# Patient Record
Sex: Male | Born: 1985 | Race: Black or African American | Hispanic: No | Marital: Married | Smoking: Current every day smoker
Health system: Southern US, Community
[De-identification: ages and names within clinical notes are randomized; demographics above are authoritative.]

## PROBLEM LIST (undated history)

## (undated) DIAGNOSIS — Z789 Other specified health status: Secondary | ICD-10-CM

## (undated) HISTORY — PX: NO PAST SURGERIES: SHX2092

---

## 2006-01-03 ENCOUNTER — Emergency Department (HOSPITAL_COMMUNITY): Admission: EM | Admit: 2006-01-03 | Discharge: 2006-01-03 | Payer: Self-pay | Admitting: *Deleted

## 2007-01-24 ENCOUNTER — Emergency Department (HOSPITAL_COMMUNITY): Admission: EM | Admit: 2007-01-24 | Discharge: 2007-01-24 | Payer: Self-pay | Admitting: Emergency Medicine

## 2007-02-09 ENCOUNTER — Emergency Department (HOSPITAL_COMMUNITY): Admission: EM | Admit: 2007-02-09 | Discharge: 2007-02-09 | Payer: Self-pay | Admitting: Emergency Medicine

## 2009-03-01 ENCOUNTER — Emergency Department (HOSPITAL_COMMUNITY): Admission: EM | Admit: 2009-03-01 | Discharge: 2009-03-01 | Payer: Self-pay | Admitting: Family Medicine

## 2009-05-11 ENCOUNTER — Emergency Department (HOSPITAL_COMMUNITY): Admission: EM | Admit: 2009-05-11 | Discharge: 2009-05-11 | Payer: Self-pay | Admitting: Family Medicine

## 2009-08-24 ENCOUNTER — Emergency Department (HOSPITAL_COMMUNITY): Admission: EM | Admit: 2009-08-24 | Discharge: 2009-08-24 | Payer: Self-pay | Admitting: Emergency Medicine

## 2009-11-04 ENCOUNTER — Emergency Department (HOSPITAL_COMMUNITY): Admission: EM | Admit: 2009-11-04 | Discharge: 2009-11-04 | Payer: Self-pay | Admitting: Emergency Medicine

## 2009-12-11 ENCOUNTER — Emergency Department (HOSPITAL_COMMUNITY): Admission: EM | Admit: 2009-12-11 | Discharge: 2009-12-11 | Payer: Self-pay | Admitting: Family Medicine

## 2010-02-25 ENCOUNTER — Emergency Department (HOSPITAL_COMMUNITY): Admission: EM | Admit: 2010-02-25 | Discharge: 2010-02-25 | Payer: Self-pay | Admitting: Family Medicine

## 2010-12-01 LAB — DIFFERENTIAL
Basophils Absolute: 0 10*3/uL (ref 0.0–0.1)
Lymphocytes Relative: 12 % (ref 12–46)
Lymphs Abs: 0.5 10*3/uL — ABNORMAL LOW (ref 0.7–4.0)
Neutro Abs: 3.4 10*3/uL (ref 1.7–7.7)
Neutrophils Relative %: 75 % (ref 43–77)

## 2010-12-01 LAB — POCT I-STAT, CHEM 8
BUN: 10 mg/dL (ref 6–23)
Chloride: 103 mEq/L (ref 96–112)
Creatinine, Ser: 1.2 mg/dL (ref 0.4–1.5)
Potassium: 3.5 mEq/L (ref 3.5–5.1)
Sodium: 137 mEq/L (ref 135–145)
TCO2: 27 mmol/L (ref 0–100)

## 2010-12-01 LAB — CBC
Platelets: 172 10*3/uL (ref 150–400)
RDW: 13.3 % (ref 11.5–15.5)
WBC: 4.5 10*3/uL (ref 4.0–10.5)

## 2011-01-06 ENCOUNTER — Emergency Department (HOSPITAL_BASED_OUTPATIENT_CLINIC_OR_DEPARTMENT_OTHER)
Admission: EM | Admit: 2011-01-06 | Discharge: 2011-01-06 | Disposition: A | Discharge status: Home / Self Care | Payer: Self-pay | Attending: Emergency Medicine | Admitting: Emergency Medicine

## 2011-01-06 DIAGNOSIS — J069 Acute upper respiratory infection, unspecified: Secondary | ICD-10-CM | POA: Insufficient documentation

## 2011-01-06 DIAGNOSIS — J45909 Unspecified asthma, uncomplicated: Secondary | ICD-10-CM | POA: Insufficient documentation

## 2011-02-25 ENCOUNTER — Emergency Department (HOSPITAL_BASED_OUTPATIENT_CLINIC_OR_DEPARTMENT_OTHER)
Admission: EM | Admit: 2011-02-25 | Discharge: 2011-02-25 | Disposition: A | Discharge status: Home / Self Care | Payer: Self-pay | Attending: Emergency Medicine | Admitting: Emergency Medicine

## 2011-02-25 DIAGNOSIS — R112 Nausea with vomiting, unspecified: Secondary | ICD-10-CM | POA: Insufficient documentation

## 2011-02-25 DIAGNOSIS — R197 Diarrhea, unspecified: Secondary | ICD-10-CM | POA: Insufficient documentation

## 2011-02-25 DIAGNOSIS — J45909 Unspecified asthma, uncomplicated: Secondary | ICD-10-CM | POA: Insufficient documentation

## 2011-02-25 DIAGNOSIS — F17200 Nicotine dependence, unspecified, uncomplicated: Secondary | ICD-10-CM | POA: Insufficient documentation

## 2011-02-25 LAB — URINALYSIS, ROUTINE W REFLEX MICROSCOPIC
Glucose, UA: NEGATIVE mg/dL
Leukocytes, UA: NEGATIVE
Protein, ur: NEGATIVE mg/dL
Specific Gravity, Urine: 1.021 (ref 1.005–1.030)
pH: 7 (ref 5.0–8.0)

## 2011-08-02 ENCOUNTER — Emergency Department (HOSPITAL_BASED_OUTPATIENT_CLINIC_OR_DEPARTMENT_OTHER)
Admission: EM | Admit: 2011-08-02 | Discharge: 2011-08-02 | Disposition: A | Discharge status: Home / Self Care | Payer: Self-pay | Attending: Emergency Medicine | Admitting: Emergency Medicine

## 2011-08-02 ENCOUNTER — Encounter: Payer: Self-pay | Admitting: *Deleted

## 2011-08-02 DIAGNOSIS — S39012A Strain of muscle, fascia and tendon of lower back, initial encounter: Secondary | ICD-10-CM

## 2011-08-02 DIAGNOSIS — Y92009 Unspecified place in unspecified non-institutional (private) residence as the place of occurrence of the external cause: Secondary | ICD-10-CM | POA: Insufficient documentation

## 2011-08-02 DIAGNOSIS — IMO0002 Reserved for concepts with insufficient information to code with codable children: Secondary | ICD-10-CM | POA: Insufficient documentation

## 2011-08-02 DIAGNOSIS — X58XXXA Exposure to other specified factors, initial encounter: Secondary | ICD-10-CM | POA: Insufficient documentation

## 2011-08-02 LAB — URINALYSIS, ROUTINE W REFLEX MICROSCOPIC
Bilirubin Urine: NEGATIVE
Glucose, UA: NEGATIVE mg/dL
Hgb urine dipstick: NEGATIVE
Urobilinogen, UA: 1 mg/dL (ref 0.0–1.0)

## 2011-08-02 LAB — URINE MICROSCOPIC-ADD ON

## 2011-08-02 MED ORDER — CYCLOBENZAPRINE HCL 10 MG PO TABS: 10.0000 mg | ORAL_TABLET | Freq: Three times a day (TID) | ORAL | Status: AC | PRN

## 2011-08-02 MED ORDER — HYDROCODONE-ACETAMINOPHEN 5-500 MG PO TABS: 1.0000 | ORAL_TABLET | Freq: Four times a day (QID) | ORAL | Status: AC | PRN

## 2011-08-02 MED ORDER — KETOROLAC TROMETHAMINE 30 MG/ML IJ SOLN
60.0000 mg | Freq: Once | INTRAMUSCULAR | Status: AC
  Administered 2011-08-02: 60 mg via INTRAMUSCULAR
  Filled 2011-08-02: qty 2

## 2011-08-02 NOTE — ED Notes (Signed)
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

## 2011-08-02 NOTE — ED Provider Notes (Signed)
History  This chart was scribed for Cyndra Numbers, MD by Bennett Scrape. This patient was seen in room [facility]1/[facility]1 and the patient's care was started at 8:12PM.  CSN: [id number] Arrival date & time: 08/02/2011  7:06 PM   First MD Initiated Contact with Patient 08/02/11 2011      Chief Complaint  Patient presents with  . Back Pain   The history is provided by the patient. No language interpreter was used.    Alexander Roberts is a 25 y.o. male who presents to the Emergency Department complaining of 2 days of constant, gradually worsening non-radiating right-sided lower back pain. Pt states that he has a h/o back pain from growth spurts when younger. Pt denies any recent injury as the cause of the pain. Pt states that 2 days ago he went to bed and woke up with the pain. Pt states that he has used biofreeze, icy hot and a heating pad and has taken Aleve with temporary mild improvement in symptoms. He reports that the pain is worse with movement and better with rest. He denies numbness and tingling as associated symptoms. Pain is 5/10 while still and 8/10 with movement.  History reviewed. No pertinent past medical history.  History reviewed. No pertinent past surgical history.  History reviewed. No pertinent family history.  History  Substance Use Topics  . Smoking status: Former Games developer  . Smokeless tobacco: Not on file  . Alcohol Use: Yes     occasional      Review of Systems  Constitutional: Negative.  Negative for fever and chills.  HENT: Negative.  Negative for congestion, sore throat and neck pain.   Eyes: Negative.  Negative for pain and visual disturbance.  Respiratory: Negative.  Negative for cough, shortness of breath and wheezing.   Cardiovascular: Negative.  Negative for chest pain and leg swelling.  Gastrointestinal: Negative.  Negative for nausea, vomiting, abdominal pain and diarrhea.  Genitourinary: Negative.  Negative for dysuria and hematuria.  Musculoskeletal:  Positive for back pain. Negative for joint swelling.  Skin: Negative.  Negative for rash and wound.  Neurological: Negative.  Negative for light-headedness and headaches.  Hematological: Negative.  Does not bruise/bleed easily.  Psychiatric/Behavioral: Negative.   All other systems reviewed and are negative.    Allergies  Review of patient's allergies indicates no known allergies.  Home Medications   Current Outpatient Rx  Name Route Sig Dispense Refill  . MENTHOL (TOPICAL ANALGESIC) 4 % EX GEL Apply externally Apply 1 application topically 3 (three) times daily as needed. For pain     . MUSCLE RUB 10-15 % EX CREA Topical Apply 1 application topically 3 (three) times daily as needed. For back pain     . NAPROXEN SODIUM 220 MG PO TABS Oral Take 440 mg by mouth 2 (two) times daily as needed. For pain      . CYCLOBENZAPRINE HCL 10 MG PO TABS Oral Take 1 tablet (10 mg total) by mouth 3 (three) times daily as needed for muscle spasms. 30 tablet 0  . HYDROCODONE-ACETAMINOPHEN 5-500 MG PO TABS Oral Take 1-2 tablets by mouth every 6 (six) hours as needed for pain. 15 tablet 0    Triage Vitals: BP 122/70  Pulse 90  Temp(Src) 98.3 F (36.8 C) (Oral)  Resp 16  SpO2 100%  Physical Exam  Nursing note and vitals reviewed. Constitutional: He is oriented to person, place, and time. He appears well-developed and well-nourished. No distress.  HENT:  Head: Normocephalic and atraumatic.  Eyes: Conjunctivae and EOM are normal. Pupils are equal, round, and reactive to light.  Neck: Neck supple. No tracheal deviation present.  Cardiovascular: Normal rate, regular rhythm and normal heart sounds.  Exam reveals no gallop and no friction rub.   No murmur heard. Pulmonary/Chest: Effort normal and breath sounds normal. No respiratory distress. He has no wheezes. He has no rales.  Abdominal: Soft. Bowel sounds are normal. He exhibits no distension. There is no tenderness. There is no rebound and no  guarding.  Musculoskeletal: Normal range of motion. He exhibits no edema and no tenderness (No tenderness to palpation).  Neurological: He is alert and oriented to person, place, and time. No cranial nerve deficit. He exhibits normal muscle tone. Coordination normal.  Skin: Skin is warm and dry.  Psychiatric: He has a normal mood and affect.    ED Course  Procedures (including critical care time)  DIAGNOSTIC STUDIES: Oxygen Saturation is 100% on room air, normal by my interpretation.    COORDINATION OF CARE: 8:14PM-Discussed prescription Vicodin and cycloprixadine muscle relaxers and back exercisers to fight stiffness and pt agreed to plan. Will give pt Toradol in ED.   Labs Reviewed  URINALYSIS, ROUTINE W REFLEX MICROSCOPIC - Abnormal; Notable for the following:    Leukocytes, UA TRACE (*)    All other components within normal limits  URINE MICROSCOPIC-ADD ON - Abnormal; Notable for the following:    Squamous Epithelial / LPF FEW (*)    Bacteria, UA MANY (*)    All other components within normal limits   No results found.   1. Back strain       MDM  Patient described the symptoms of back strain and when he did perform bending or twisting motions this exacerbated his symptoms.  He had no red flags.  Patient does work lifting heavy boxes.  He was given flexeril and vicodin and advised to continue anti-inflammatory therapy with alleve.  Patient was given a note for work if he needed it and discharged in good condition.      I personally performed the services described in this documentation, which was scribed in my presence. The recorded information has been reviewed and considered.    Cyndra Numbers, MD 08/03/11 [phone]

## 2012-01-15 ENCOUNTER — Emergency Department (HOSPITAL_BASED_OUTPATIENT_CLINIC_OR_DEPARTMENT_OTHER)
Admission: EM | Admit: 2012-01-15 | Discharge: 2012-01-15 | Disposition: A | Discharge status: Home / Self Care | Payer: 59 | Attending: Emergency Medicine | Admitting: Emergency Medicine

## 2012-01-15 ENCOUNTER — Encounter (HOSPITAL_BASED_OUTPATIENT_CLINIC_OR_DEPARTMENT_OTHER): Payer: Self-pay | Admitting: Family Medicine

## 2012-01-15 DIAGNOSIS — F17200 Nicotine dependence, unspecified, uncomplicated: Secondary | ICD-10-CM | POA: Insufficient documentation

## 2012-01-15 DIAGNOSIS — Z79899 Other long term (current) drug therapy: Secondary | ICD-10-CM | POA: Insufficient documentation

## 2012-01-15 DIAGNOSIS — R6884 Jaw pain: Secondary | ICD-10-CM | POA: Insufficient documentation

## 2012-01-15 DIAGNOSIS — K0889 Other specified disorders of teeth and supporting structures: Secondary | ICD-10-CM

## 2012-01-15 DIAGNOSIS — K029 Dental caries, unspecified: Secondary | ICD-10-CM | POA: Insufficient documentation

## 2012-01-15 DIAGNOSIS — K089 Disorder of teeth and supporting structures, unspecified: Secondary | ICD-10-CM | POA: Insufficient documentation

## 2012-01-15 MED ORDER — TRAMADOL HCL 50 MG PO TABS: 50.0000 mg | ORAL_TABLET | Freq: Four times a day (QID) | ORAL | Status: AC | PRN

## 2012-01-15 MED ORDER — IBUPROFEN 800 MG PO TABS
800.0000 mg | ORAL_TABLET | Freq: Once | ORAL | Status: AC
  Administered 2012-01-15: 800 mg via ORAL
  Filled 2012-01-15: qty 1

## 2012-01-15 MED ORDER — PENICILLIN V POTASSIUM 250 MG PO TABS
500.0000 mg | ORAL_TABLET | Freq: Once | ORAL | Status: AC
  Administered 2012-01-15: 500 mg via ORAL
  Filled 2012-01-15: qty 2

## 2012-01-15 MED ORDER — NAPROXEN 500 MG PO TABS: 500.0000 mg | ORAL_TABLET | Freq: Two times a day (BID) | ORAL | Status: AC

## 2012-01-15 MED ORDER — OXYCODONE-ACETAMINOPHEN 5-325 MG PO TABS
2.0000 | ORAL_TABLET | Freq: Once | ORAL | Status: AC
  Administered 2012-01-15: 2 via ORAL
  Filled 2012-01-15: qty 2

## 2012-01-15 MED ORDER — PENICILLIN V POTASSIUM 500 MG PO TABS: 500.0000 mg | ORAL_TABLET | Freq: Three times a day (TID) | ORAL | Status: AC

## 2012-01-15 NOTE — ED Notes (Signed)
Pt c/o left lower tooth pain x 2 days. Pt sts he is taking otc ibuprofen and orajel withou relief. Pt sts he does not have dentist.

## 2012-01-15 NOTE — Discharge Instructions (Signed)
Dental Pain A tooth ache may be caused by cavities (tooth decay). Cavities expose the nerve of the tooth to air and hot or cold temperatures. It may come from an infection or abscess (also called a boil or furuncle) around your tooth. It is also often caused by dental caries (tooth decay). This causes the pain you are having. DIAGNOSIS  Your caregiver can diagnose this problem by exam. TREATMENT   If caused by an infection, it may be treated with medications which kill germs (antibiotics) and pain medications as prescribed by your caregiver. Take medications as directed.   Only take over-the-counter or prescription medicines for pain, discomfort, or fever as directed by your caregiver.   Whether the tooth ache today is caused by infection or dental disease, you should see your dentist as soon as possible for further care.  SEEK MEDICAL CARE IF: The exam and treatment you received today has been provided on an emergency basis only. This is not a substitute for complete medical or dental care. If your problem worsens or new problems (symptoms) appear, and you are unable to meet with your dentist, call or return to this location. SEEK IMMEDIATE MEDICAL CARE IF:   You have a fever.   You develop redness and swelling of your face, jaw, or neck.   You are unable to open your mouth.   You have severe pain uncontrolled by pain medicine.  MAKE SURE YOU:   Understand these instructions.   Will watch your condition.   Will get help right away if you are not doing well or get worse.  Document Released: 08/17/2005 Document Revised: 08/06/2011 Document Reviewed: 04/04/2008 [facility] Patient Information 2012 [place], [place].  Dental Caries  Tooth decay (dental caries, cavities) is the most common of all oral diseases. It occurs in all ages but is more common in children and young adults.  CAUSES  Bacteria in your mouth combine with foods (particularly sugars and starches) to produce plaque. Plaque  is a substance that sticks to the hard surfaces of teeth. The bacteria in the plaque produce acids that attack the enamel of teeth. Repeated acid attacks dissolve the enamel and create holes in the teeth. Root surfaces of teeth may also get these holes.  Other contributing factors include:   Frequent snacking and drinking of cavity-producing foods and liquids.   Poor oral hygiene.   Dry mouth.   Substance abuse such as methamphetamine.   Broken or poor fitting dental restorations.   Eating disorders.   Gastroesophageal reflux disease (GERD).   Certain radiation treatments to the head and neck.  SYMPTOMS  At first, dental decay appears as white, chalky areas on the enamel. In this early stage, symptoms are seldom present. As the decay progresses, pits and holes may appear on the enamel surfaces. Progression of the decay will lead to softening of the hard layers of the tooth. At this point you may experience some pain or achy feeling after sweet, hot, or cold foods or drinks are consumed. If left untreated, the decay will reach the internal structures of the tooth and produce severe pain. Extensive dental treatment, such as root canal therapy, may be needed to save the tooth at this late stage of decay development.  DIAGNOSIS  Most cavities will be detected during regular check-ups. A thorough medical and dental history will be taken by the dentist. The dentist will use instruments to check the surfaces of your teeth for any breakdown or discoloration. Some dentists have special instruments,  such as lasers, that detect tooth decay. Dental X-rays may also show some cavities that are not visible to the eye (such as between the contact areas of the teeth). TREATMENT  Treatment involves removal of the tooth decay and replacement with a restorative material such as silver, gold, or composite (white) material. However, if the decay involves a large area of the tooth and there is little remaining  healthy tooth structure, a cap (crown) will be fitted over the remaining structure. If the decay involves the center part of the tooth (pulp), root canal treatment will be needed before any type of dental restoration is placed. If the tooth is severely destroyed by the decay process, leaving the remaining tooth structures unrestorable, the tooth will need to be pulled (extracted). Some early tooth decay may be reversed by fluoride treatments and thorough brushing and flossing at home. PREVENTION   Eat healthy foods. Restrict the amount of sugary, starchy foods and liquids you consume. Avoid frequent snacking and drinking of unhealthy foods and liquids.   Sealants can help with prevention of cavities. Sealants are composite resins applied onto the biting surfaces of teeth at risk for decay. They smooth out the pits and grooves and prevent food from being trapped in them. This is done in early childhood before tooth decay has started.   Fluoride tablets may also be prescribed to children between 6 months and 52 years of age if your drinking water is not fluoridated. The fluoride absorbed by the tooth enamel makes teeth less susceptible to decay. Thorough daily cleaning with a toothbrush and dental floss is the best way to prevent cavities. Use of a fluoride toothpaste is highly recommended. Fluoride mouth rinses may be used in specific cases.   Topical application of fluoride by your dentist is important in children.   Regular visits with a dentist for checkups and cleanings are also important.  SEEK IMMEDIATE DENTAL CARE IF:  You have a fever.   You develop redness and swelling of your face, jaw, or neck.   You develop swelling around a tooth.   You are unable to open your mouth or cannot swallow.   You have severe pain uncontrolled by pain medicine.  Document Released: 05/09/2002 Document Revised: 08/06/2011 Document Reviewed: 01/22/2011 [facility] Patient Information 2012 [place],  [place].  RESOURCE GUIDE  Dental Problems  Patients with Medicaid: [facility] [phone] [address]                                           [phone] [address] Phone:  [phone]                                                  Phone:  [phone]  If unable to pay or uninsured, contact:  [facility] or [facility]. to become qualified for the adult dental clinic.  Chronic Pain Problems Contact [facility]  [phone] Patients need to be referred by their primary care doctor.  Insufficient Money for Medicine Contact [employer]:  call "211" or [facility] 419-437-8000.  No Primary  Care Doctor Call [facility]  [phone] Other agencies that provide inexpensive medical care    [facility]  [phone]    [facility]  [phone]    [facility]  [phone]    [facility]  [phone]    [facility]  [phone]    [facility]  [phone]  Psychological Services [facility]  [phone] [facility] Services  [phone] [facility]   [phone] (emergency services [phone])  Substance Abuse Resources [facility]  [phone] [facility] [phone] [facility] [phone] Floydene Flock [phone] Residential & Outpatient Substance Abuse Program  (559)[phone]  Abuse/Neglect [facility] Child Abuse Hotline 616-738-9983 [facility] Child Abuse Hotline 773-256-5756 (After Hours)  Emergency Shelter [facility] [phone]  Maternity Homes [facility] 9164494521 [facility] [phone]  MRSA Hotline #:   587-102-3353    [facility] Resources  [facility]     [employer]                          [facility] Dept. [address] [place]                       [address]      [address]  Blondell Reveal Phone:  [phone]                                   Phone:  [phone]                 Phone:  [phone]  [facility] Mental Health Phone:  [phone]  [facility] Child Abuse Hotline [phone]) [phone])315-0771 (After Hours)

## 2012-01-15 NOTE — ED Provider Notes (Signed)
History     CSN: [id number]  Arrival date & time 01/15/12  1106   First MD Initiated Contact with Patient 01/15/12 1122      Chief Complaint  Patient presents with  . Dental Pain    (Consider location/radiation/quality/duration/timing/severity/associated sxs/prior treatment) HPI Comments: Has not seen a dentist in several years  Patient is a 26 y.o. male presenting with tooth pain. The history is provided by the patient. No language interpreter was used.  Dental PainPrimary symptoms do not include dental injury, oral bleeding, oral lesions, headaches, fever, shortness of breath, sore throat or cough. The symptoms began 2 days ago. The symptoms are worsening. The symptoms are new. The symptoms occur constantly.  Additional symptoms include: dental sensitivity to temperature and jaw pain. Additional symptoms do not include: gum swelling, gum tenderness, trismus, facial swelling, trouble swallowing, pain with swallowing and fatigue.    History reviewed. No pertinent past medical history.  History reviewed. No pertinent past surgical history.  No family history on file.  History  Substance Use Topics  . Smoking status: Current Some Day Smoker  . Smokeless tobacco: Not on file  . Alcohol Use: Yes     occasional      Review of Systems  Constitutional: Negative for fever, activity change, appetite change and fatigue.  HENT: Positive for dental problem. Negative for congestion, sore throat, facial swelling, rhinorrhea, trouble swallowing, neck pain and neck stiffness.   Respiratory: Negative for cough and shortness of breath.   Cardiovascular: Negative for chest pain and palpitations.  Gastrointestinal: Negative for nausea, vomiting and abdominal pain.  Genitourinary: Negative for dysuria, urgency, frequency and flank pain.  Musculoskeletal: Negative for myalgias, back pain and arthralgias.  Neurological: Negative for dizziness, weakness, light-headedness, numbness and  headaches.  All other systems reviewed and are negative.    Allergies  Review of patient's allergies indicates no known allergies.  Home Medications   Current Outpatient Rx  Name Route Sig Dispense Refill  . MENTHOL (TOPICAL ANALGESIC) 4 % EX GEL Apply externally Apply 1 application topically 3 (three) times daily as needed. For pain     . MUSCLE RUB 10-15 % EX CREA Topical Apply 1 application topically 3 (three) times daily as needed. For back pain     . NAPROXEN 500 MG PO TABS Oral Take 1 tablet (500 mg total) by mouth 2 (two) times daily. 30 tablet 0  . NAPROXEN SODIUM 220 MG PO TABS Oral Take 440 mg by mouth 2 (two) times daily as needed. For pain      . PENICILLIN V POTASSIUM 500 MG PO TABS Oral Take 1 tablet (500 mg total) by mouth 3 (three) times daily. 30 tablet 0  . TRAMADOL HCL 50 MG PO TABS Oral Take 1 tablet (50 mg total) by mouth every 6 (six) hours as needed for pain. 15 tablet 0    BP 137/70  Pulse 63  Temp(Src) 98.3 F (36.8 C) (Oral)  Resp 16  Ht 6\' 3"  (1.905 m)  Wt 270 lb (122.471 kg)  BMI 33.75 kg/m2  SpO2 100%  Physical Exam  Nursing note and vitals reviewed. Constitutional: He is oriented to person, place, and time. He appears well-developed and well-nourished. No distress.  HENT:  Head: Normocephalic and atraumatic.  Mouth/Throat: Oropharynx is clear and moist. Abnormal dentition. Dental caries present. No dental abscesses.    Eyes: Conjunctivae and EOM are normal. Pupils are equal, round, and reactive to light.  Neck: Normal range of motion. Neck supple.  Cardiovascular: Normal rate, regular rhythm, normal heart sounds and intact distal pulses.  Exam reveals no gallop and no friction rub.   No murmur heard. Pulmonary/Chest: Effort normal and breath sounds normal. No respiratory distress. He exhibits no tenderness.  Abdominal: Soft. Bowel sounds are normal. There is no tenderness. There is no rebound and no guarding.  Musculoskeletal: Normal range of  motion. He exhibits no edema and no tenderness.  Neurological: He is alert and oriented to person, place, and time. No cranial nerve deficit.    ED Course  Procedures (including critical care time)  Labs Reviewed - No data to display No results found.   1. Dental caries   2. Dentalgia       MDM  Dental pain with no evidence of abscess or Ludwig's angina. He was placed on penicillin given Percocet and ibuprofen the emergency department. He'll be discharged home with Naprosyn, penicillin, Ultram. Instructed to followup with the dentist.        Dayton Bailiff, MD 01/15/12 1140

## 2012-02-07 ENCOUNTER — Encounter (HOSPITAL_COMMUNITY): Payer: Self-pay | Admitting: *Deleted

## 2012-02-07 ENCOUNTER — Emergency Department (INDEPENDENT_AMBULATORY_CARE_PROVIDER_SITE_OTHER)
Admission: EM | Admit: 2012-02-07 | Discharge: 2012-02-07 | Disposition: A | Discharge status: Home / Self Care | Payer: 59 | Source: Home / Self Care | Attending: Emergency Medicine | Admitting: Emergency Medicine

## 2012-02-07 DIAGNOSIS — K089 Disorder of teeth and supporting structures, unspecified: Secondary | ICD-10-CM

## 2012-02-07 DIAGNOSIS — K0889 Other specified disorders of teeth and supporting structures: Secondary | ICD-10-CM

## 2012-02-07 MED ORDER — PENICILLIN V POTASSIUM 500 MG PO TABS: 500.0000 mg | ORAL_TABLET | Freq: Four times a day (QID) | ORAL | Status: AC

## 2012-02-07 MED ORDER — NAPROXEN 500 MG PO TABS: 500.0000 mg | ORAL_TABLET | Freq: Two times a day (BID) | ORAL | Status: AC

## 2012-02-07 MED ORDER — HYDROCODONE-ACETAMINOPHEN 5-325 MG PO TABS: 1.0000 | ORAL_TABLET | ORAL | Status: AC | PRN

## 2012-02-07 NOTE — ED Notes (Signed)
Per pt left lower tooth pain x 2 - 3 weeks - will see dentist tomorrow just rec'd dental insurance

## 2012-02-07 NOTE — ED Provider Notes (Signed)
History     CSN: [id number]  Arrival date & time 02/07/12  1122   First MD Initiated Contact with Patient 02/07/12 1237      Chief Complaint  Patient presents with  . Dental Pain    (Consider location/radiation/quality/duration/timing/severity/associated sxs/prior treatment) HPI Comments: Pt's filling L lower jaw tooth fell out "a while ago". Was tx once previously for pain in this tooth that helped, requests tx again.  Sx worse in last 2-3 weeks.  Has funds to be able to go to dentist this week, but requests pencillin and naproxen again for pain until he can see dentist.      Patient is a 26 y.o. male presenting with tooth pain. The history is provided by the patient.  Dental PainThe primary symptoms include mouth pain. Primary symptoms do not include fever or sore throat. Episode onset: months ago. The symptoms are worsening. The symptoms are recurrent. The symptoms occur constantly.  Additional symptoms include: dental sensitivity to temperature. Additional symptoms do not include: gum tenderness and purulent gums.    History reviewed. No pertinent past medical history.  History reviewed. No pertinent past surgical history.  History reviewed. No pertinent family history.  History  Substance Use Topics  . Smoking status: Current Some Day Smoker  . Smokeless tobacco: Not on file  . Alcohol Use: Yes     occasional      Review of Systems  Constitutional: Negative for fever and chills.  HENT: Positive for dental problem. Negative for sore throat.     Allergies  Review of patient's allergies indicates no known allergies.  Home Medications   Current Outpatient Rx  Name Route Sig Dispense Refill  . HYDROCODONE-ACETAMINOPHEN 5-325 MG PO TABS Oral Take 1-2 tablets by mouth every 4 (four) hours as needed for pain. 10 tablet 0  . MENTHOL (TOPICAL ANALGESIC) 4 % EX GEL Apply externally Apply 1 application topically 3 (three) times daily as needed. For pain    . MUSCLE RUB  10-15 % EX CREA Topical Apply 1 application topically 3 (three) times daily as needed. For back pain     . NAPROXEN 500 MG PO TABS Oral Take 1 tablet (500 mg total) by mouth 2 (two) times daily. 30 tablet 0  . NAPROXEN 500 MG PO TABS Oral Take 1 tablet (500 mg total) by mouth 2 (two) times daily. 30 tablet 0  . NAPROXEN SODIUM 220 MG PO TABS Oral Take 440 mg by mouth 2 (two) times daily as needed. For pain      . PENICILLIN V POTASSIUM 500 MG PO TABS Oral Take 1 tablet (500 mg total) by mouth 4 (four) times daily. 40 tablet 0    BP 144/76  Pulse 63  Temp(Src) 98.8 F (37.1 C) (Oral)  Resp 16  SpO2 99%  Physical Exam  Constitutional: He appears well-developed and well-nourished. No distress.  HENT:  Mouth/Throat: No dental abscesses.    Pulmonary/Chest: Effort normal.  Lymphadenopathy:       Head (right side): No submandibular adenopathy present.       Head (left side): Submandibular adenopathy present.    ED Course  Procedures (including critical care time)  Labs Reviewed - No data to display No results found.   1. Pain, dental       MDM          Cathlyn Parsons, NP 02/07/12 1246

## 2012-02-07 NOTE — Discharge Instructions (Signed)
Please follow up with a dentist as soon as possible.   Dental Pain A tooth ache may be caused by cavities (tooth decay). Cavities expose the nerve of the tooth to air and hot or cold temperatures. It may come from an infection or abscess (also called a boil or furuncle) around your tooth. It is also often caused by dental caries (tooth decay). This causes the pain you are having. DIAGNOSIS  Your caregiver can diagnose this problem by exam. TREATMENT   If caused by an infection, it may be treated with medications which kill germs (antibiotics) and pain medications as prescribed by your caregiver. Take medications as directed.   Only take over-the-counter or prescription medicines for pain, discomfort, or fever as directed by your caregiver.   Whether the tooth ache today is caused by infection or dental disease, you should see your dentist as soon as possible for further care.  SEEK MEDICAL CARE IF: The exam and treatment you received today has been provided on an emergency basis only. This is not a substitute for complete medical or dental care. If your problem worsens or new problems (symptoms) appear, and you are unable to meet with your dentist, call or return to this location. SEEK IMMEDIATE MEDICAL CARE IF:   You have a fever.   You develop redness and swelling of your face, jaw, or neck.   You are unable to open your mouth.   You have severe pain uncontrolled by pain medicine.  MAKE SURE YOU:   Understand these instructions.   Will watch your condition.   Will get help right away if you are not doing well or get worse.  Document Released: 08/17/2005 Document Revised: 08/06/2011 Document Reviewed: 04/04/2008 [facility] Patient Information 2012 [place], [place].

## 2012-02-07 NOTE — ED Provider Notes (Signed)
Medical screening examination/treatment/procedure(s) were performed by non-physician practitioner and as supervising physician I was immediately available for consultation/collaboration.  Bernarr Longsworth M. MD   Hadyn Azer M Laporsha Grealish, MD 02/07/12 2013 

## 2012-06-19 ENCOUNTER — Encounter (HOSPITAL_BASED_OUTPATIENT_CLINIC_OR_DEPARTMENT_OTHER): Payer: Self-pay | Admitting: *Deleted

## 2012-06-19 ENCOUNTER — Emergency Department (HOSPITAL_BASED_OUTPATIENT_CLINIC_OR_DEPARTMENT_OTHER)
Admission: EM | Admit: 2012-06-19 | Discharge: 2012-06-19 | Disposition: A | Discharge status: Home / Self Care | Payer: 59 | Attending: Emergency Medicine | Admitting: Emergency Medicine

## 2012-06-19 DIAGNOSIS — IMO0002 Reserved for concepts with insufficient information to code with codable children: Secondary | ICD-10-CM | POA: Insufficient documentation

## 2012-06-19 DIAGNOSIS — Y998 Other external cause status: Secondary | ICD-10-CM | POA: Insufficient documentation

## 2012-06-19 DIAGNOSIS — Y9389 Activity, other specified: Secondary | ICD-10-CM | POA: Insufficient documentation

## 2012-06-19 DIAGNOSIS — S39012A Strain of muscle, fascia and tendon of lower back, initial encounter: Secondary | ICD-10-CM

## 2012-06-19 DIAGNOSIS — F17200 Nicotine dependence, unspecified, uncomplicated: Secondary | ICD-10-CM | POA: Insufficient documentation

## 2012-06-19 LAB — URINALYSIS, ROUTINE W REFLEX MICROSCOPIC
Glucose, UA: NEGATIVE mg/dL
Ketones, ur: NEGATIVE mg/dL
Leukocytes, UA: NEGATIVE
Nitrite: NEGATIVE
Protein, ur: NEGATIVE mg/dL
Urobilinogen, UA: 1 mg/dL (ref 0.0–1.0)

## 2012-06-19 MED ORDER — OXYCODONE-ACETAMINOPHEN 5-325 MG PO TABS
2.0000 | ORAL_TABLET | Freq: Once | ORAL | Status: DC
  Filled 2012-06-19: qty 2

## 2012-06-19 MED ORDER — KETOROLAC TROMETHAMINE 60 MG/2ML IM SOLN
60.0000 mg | Freq: Once | INTRAMUSCULAR | Status: AC
  Administered 2012-06-19: 60 mg via INTRAMUSCULAR
  Filled 2012-06-19: qty 2

## 2012-06-19 MED ORDER — IBUPROFEN 800 MG PO TABS: 800.0000 mg | ORAL_TABLET | Freq: Three times a day (TID) | ORAL | Status: DC

## 2012-06-19 NOTE — ED Notes (Signed)
Patient requested additional narcotics from the MD & was instructed to take the medications that he currently had and call the orthopedic MD for continued pain

## 2012-06-19 NOTE — ED Notes (Signed)
Pt was very insistent with re: to narcotic rx.  Explained to pt that EDP did not feel rx warranted at this time as pt already has rx for percocet and flexeril.  Further explained to pt that inflammation can also cause pain and the Motin is the best solution for that.  Also went over alternatives to medication including rest and ice/heat.

## 2012-06-19 NOTE — ED Provider Notes (Signed)
History     CSN: [id number]  Arrival date & time 06/19/12  1250   First MD Initiated Contact with Patient 06/19/12 1304      Chief Complaint  Patient presents with  . Back Pain    (Consider location/radiation/quality/duration/timing/severity/associated sxs/prior treatment) HPI Comments: Patient presents with lower lumbar back pain for the past 6 days. Started after he was cutting his girlfriend and she dropped all of her weight onto him. Pain is in the center of his back and does not radiate. It is worse with position changes worse with lifting. Denies any weakness, numbness or tingling. Denies any bowel or bladder incontinence. Denies any fevers or vomiting. To urgent care yesterday was given Flexeril and Vicodin which she has been taking without significant relief. Back pain worsened after he was lifting produce today. Has had intermittent issues in the past the pain is worse after this incident.  The history is provided by the patient.    History reviewed. No pertinent past medical history.  History reviewed. No pertinent past surgical history.  History reviewed. No pertinent family history.  History  Substance Use Topics  . Smoking status: Current Some Day Smoker  . Smokeless tobacco: Not on file  . Alcohol Use: Yes     occasional      Review of Systems  Constitutional: Negative for activity change and appetite change.  HENT: Negative for congestion and rhinorrhea.   Respiratory: Negative for cough and shortness of breath.   Cardiovascular: Negative for chest pain.  Gastrointestinal: Negative for nausea, vomiting and abdominal pain.  Genitourinary: Negative for dysuria and hematuria.  Musculoskeletal: Positive for back pain.  Skin: Negative for rash.  Neurological: Negative for dizziness and headaches.    Allergies  Review of patient's allergies indicates no known allergies.  Home Medications   Current Outpatient Rx  Name Route Sig Dispense Refill  .  CYCLOBENZAPRINE HCL 10 MG PO TABS Oral Take 10 mg by mouth 3 (three) times daily as needed.    Marland Kitchen HYDROCODONE-ACETAMINOPHEN 5-325 MG PO TABS Oral Take 1 tablet by mouth every 6 (six) hours as needed.    . IBUPROFEN 800 MG PO TABS Oral Take 1 tablet (800 mg total) by mouth 3 (three) times daily. 21 tablet 0  . MENTHOL (TOPICAL ANALGESIC) 4 % EX GEL Apply externally Apply 1 application topically 3 (three) times daily as needed. For pain    . MUSCLE RUB 10-15 % EX CREA Topical Apply 1 application topically 3 (three) times daily as needed. For back pain     . NAPROXEN 500 MG PO TABS Oral Take 1 tablet (500 mg total) by mouth 2 (two) times daily. 30 tablet 0  . NAPROXEN 500 MG PO TABS Oral Take 1 tablet (500 mg total) by mouth 2 (two) times daily. 30 tablet 0  . NAPROXEN SODIUM 220 MG PO TABS Oral Take 440 mg by mouth 2 (two) times daily as needed. For pain        BP 137/59  Pulse 80  Temp 98.3 F (36.8 C) (Oral)  Resp 18  Ht 6\' 3"  (1.905 m)  Wt 280 lb (127.007 kg)  BMI 35.00 kg/m2  SpO2 100%  Physical Exam  Constitutional: He is oriented to person, place, and time. He appears well-developed and well-nourished. No distress.  HENT:  Head: Normocephalic and atraumatic.  Mouth/Throat: Oropharynx is clear and moist. No oropharyngeal exudate.  Eyes: Conjunctivae normal are normal. Pupils are equal, round, and reactive to light.  Neck:  Normal range of motion. Neck supple.  Cardiovascular: Normal rate, regular rhythm and normal heart sounds.   No murmur heard. Pulmonary/Chest: Effort normal and breath sounds normal. No respiratory distress.  Abdominal: Soft. There is no tenderness. There is no rebound and no guarding.  Musculoskeletal: Normal range of motion. He exhibits tenderness.       Diffuse paraspinal lumbar pain, no stepoffs or deformity 5/5 strength in bilateral lower extremities. Ankle plantar and dorsiflexion intact. Great toe extension intact bilaterally. +2 DP and PT pulses. +2  patellar reflexes bilaterally. Normal gait.   Neurological: He is alert and oriented to person, place, and time. No cranial nerve deficit.  Skin: Skin is warm.    ED Course  Procedures (including critical care time)   Labs Reviewed  URINALYSIS, ROUTINE W REFLEX MICROSCOPIC   No results found.   1. Back strain       MDM  Lower back pain without radiation after lifting injury. No weakness, numbness, tingling, incontinence or other red flags.  No distress, normal gait.  Drove self to ED.  Toradol IM.  Patient requested further narcotics. I declined as patient just received a narcotic prescription yesterday. He is very comfortable on evaluation and playing on his phone.      Glynn Octave, MD 06/19/12 1452

## 2012-06-19 NOTE — ED Notes (Signed)
Pt states he injured his back on Monday. Seen at [facility] yesterday and given Flexeril and Vicodin, but they are not helping.

## 2013-04-25 ENCOUNTER — Emergency Department (HOSPITAL_COMMUNITY)
Admission: EM | Admit: 2013-04-25 | Discharge: 2013-04-25 | Disposition: A | Discharge status: Home / Self Care | Payer: 59 | Source: Home / Self Care | Attending: Family Medicine | Admitting: Family Medicine

## 2013-04-25 ENCOUNTER — Encounter (HOSPITAL_COMMUNITY): Payer: Self-pay | Admitting: Emergency Medicine

## 2013-04-25 DIAGNOSIS — J069 Acute upper respiratory infection, unspecified: Secondary | ICD-10-CM

## 2013-04-25 MED ORDER — IPRATROPIUM BROMIDE 0.06 % NA SOLN: 2.0000 | Freq: Four times a day (QID) | NASAL | Status: DC

## 2013-04-25 MED ORDER — HYDROCOD POLST-CHLORPHEN POLST 10-8 MG/5ML PO LQCR: 5.0000 mL | Freq: Two times a day (BID) | ORAL | Status: DC | PRN

## 2013-04-25 NOTE — ED Notes (Signed)
C/o cold symptoms since Sunday.  Patient states he has a hard productive clear cough with runny nose.  Patient states he has facial pain.  OTC medication taken but no relief.

## 2013-04-25 NOTE — Discharge Instructions (Signed)
Drink plenty of fluids as discussed, use medicine as prescribed, and mucinex or delsym for cough. Return or see your doctor if further problems

## 2013-04-25 NOTE — ED Provider Notes (Signed)
CSN: [id number]     Arrival date & time 04/25/13  0845 History   First MD Initiated Contact with Patient 04/25/13 [phone]     Chief Complaint  Patient presents with  . URI   (Consider location/radiation/quality/duration/timing/severity/associated sxs/prior Treatment) Patient is a 27 y.o. male presenting with URI. The history is provided by the patient.  URI Presenting symptoms: congestion, cough and rhinorrhea   Presenting symptoms: no fever and no sore throat   Severity:  Mild Duration:  3 days Progression:  Unchanged Chronicity:  New Relieved by:  Nothing Worsened by:  Nothing tried Ineffective treatments:  OTC medications Associated symptoms: no wheezing   Risk factors: sick contacts     History reviewed. No pertinent past medical history. History reviewed. No pertinent past surgical history. History reviewed. No pertinent family history. History  Substance Use Topics  . Smoking status: Current Some Day Smoker  . Smokeless tobacco: Not on file  . Alcohol Use: Yes     Comment: occasional    Review of Systems  Constitutional: Negative.  Negative for fever and chills.  HENT: Positive for congestion, rhinorrhea and postnasal drip. Negative for sore throat.   Respiratory: Positive for cough. Negative for shortness of breath and wheezing.   Cardiovascular: Negative.   Gastrointestinal: Negative.     Allergies  Review of patient's allergies indicates no known allergies.  Home Medications   Current Outpatient Rx  Name  Route  Sig  Dispense  Refill  . chlorpheniramine-HYDROcodone (TUSSIONEX PENNKINETIC ER) 10-8 MG/5ML LQCR   Oral   Take 5 mLs by mouth every 12 (twelve) hours as needed.   115 mL   0   . cyclobenzaprine (FLEXERIL) 10 MG tablet   Oral   Take 10 mg by mouth 3 (three) times daily as needed.         Marland Kitchen HYDROcodone-acetaminophen (NORCO/VICODIN) 5-325 MG per tablet   Oral   Take 1 tablet by mouth every 6 (six) hours as needed.         Marland Kitchen ibuprofen  (ADVIL,MOTRIN) 800 MG tablet   Oral   Take 1 tablet (800 mg total) by mouth 3 (three) times daily.   21 tablet   0   . ipratropium (ATROVENT) 0.06 % nasal spray   Nasal   Place 2 sprays into the nose 4 (four) times daily.   15 mL   1   . Menthol, Topical Analgesic, (BIOFREEZE) 4 % GEL   Apply externally   Apply 1 application topically 3 (three) times daily as needed. For pain         . Menthol-Methyl Salicylate (MUSCLE RUB) 10-15 % CREA   Topical   Apply 1 application topically 3 (three) times daily as needed. For back pain          . naproxen sodium (ANAPROX) 220 MG tablet   Oral   Take 440 mg by mouth 2 (two) times daily as needed. For pain            BP 116/95  Pulse 71  Temp(Src) 98.4 F (36.9 C) (Oral)  Resp 24  SpO2 100% Physical Exam  Nursing note and vitals reviewed. Constitutional: He is oriented to person, place, and time. He appears well-developed and well-nourished.  HENT:  Head: Normocephalic.  Right Ear: External ear normal.  Left Ear: External ear normal.  Mouth/Throat: Oropharynx is clear and moist.  Eyes: Pupils are equal, round, and reactive to light.  Neck: Normal range of motion. Neck supple.  Cardiovascular:  Normal rate, regular rhythm, normal heart sounds and intact distal pulses.   Pulmonary/Chest: Effort normal and breath sounds normal.  Lymphadenopathy:    He has no cervical adenopathy.  Neurological: He is alert and oriented to person, place, and time.  Skin: Skin is warm and dry.    ED Course  Procedures (including critical care time) Labs Review Labs Reviewed - No data to display Imaging Review No results found.  MDM   1. URI (upper respiratory infection)       Linna Hoff, MD 04/25/13 [phone]

## 2013-06-26 ENCOUNTER — Ambulatory Visit: Payer: 59 | Admitting: Family Medicine

## 2013-08-22 ENCOUNTER — Ambulatory Visit: Payer: 59 | Admitting: Family Medicine

## 2013-09-05 ENCOUNTER — Encounter: Payer: 59 | Admitting: Family Medicine

## 2013-09-05 NOTE — Progress Notes (Signed)
Error   This encounter was created in error - please disregard. 

## 2014-09-30 ENCOUNTER — Encounter (HOSPITAL_COMMUNITY): Payer: Self-pay | Admitting: *Deleted

## 2014-09-30 ENCOUNTER — Emergency Department (HOSPITAL_COMMUNITY)
Admission: EM | Admit: 2014-09-30 | Discharge: 2014-09-30 | Disposition: A | Discharge status: Home / Self Care | Payer: 59 | Source: Home / Self Care | Attending: Family Medicine | Admitting: Family Medicine

## 2014-09-30 DIAGNOSIS — M545 Low back pain, unspecified: Secondary | ICD-10-CM

## 2014-09-30 MED ORDER — KETOROLAC TROMETHAMINE 60 MG/2ML IM SOLN
60.0000 mg | Freq: Once | INTRAMUSCULAR | Status: AC
  Administered 2014-09-30: 60 mg via INTRAMUSCULAR

## 2014-09-30 MED ORDER — METHYLPREDNISOLONE ACETATE 80 MG/ML IJ SUSP
INTRAMUSCULAR | Status: AC
  Filled 2014-09-30: qty 1

## 2014-09-30 MED ORDER — KETOROLAC TROMETHAMINE 60 MG/2ML IM SOLN
INTRAMUSCULAR | Status: AC
  Filled 2014-09-30: qty 2

## 2014-09-30 MED ORDER — METHYLPREDNISOLONE 4 MG PO KIT: PACK | ORAL | Status: DC

## 2014-09-30 MED ORDER — METHYLPREDNISOLONE ACETATE PF 80 MG/ML IJ SUSP
80.0000 mg | Freq: Once | INTRAMUSCULAR | Status: AC
  Administered 2014-09-30: 80 mg via INTRAMUSCULAR

## 2014-09-30 MED ORDER — CYCLOBENZAPRINE HCL 5 MG PO TABS: 5.0000 mg | ORAL_TABLET | Freq: Three times a day (TID) | ORAL | Status: DC | PRN

## 2014-09-30 MED ORDER — TRAMADOL HCL 50 MG PO TABS: 50.0000 mg | ORAL_TABLET | Freq: Four times a day (QID) | ORAL | Status: DC | PRN

## 2014-09-30 NOTE — ED Provider Notes (Signed)
CSN: [id number]     Arrival date & time 09/30/14  1756 History   First MD Initiated Contact with Patient 09/30/14 1829     Chief Complaint  Patient presents with  . Back Pain   (Consider location/radiation/quality/duration/timing/severity/associated sxs/prior Treatment) Patient is a 29 y.o. male presenting with back pain. The history is provided by the patient.  Back Pain Location:  Lumbar spine Quality:  Stiffness and stabbing Radiates to:  Does not radiate Pain severity:  Moderate Onset quality:  Gradual Duration:  6 days Progression:  Unchanged Chronicity:  Chronic Context: lifting heavy objects and twisting   Relieved by:  None tried Worsened by:  Nothing tried Associated symptoms: no abdominal pain, no bladder incontinence, no bowel incontinence, no fever, no leg pain, no numbness, no paresthesias, no pelvic pain, no tingling and no weakness   Risk factors comment:  H/o back problems with mri by [facility] and told he needs surg on back, ,pt reluctant.   History reviewed. No pertinent past medical history. No past surgical history on file. History reviewed. No pertinent family history. History  Substance Use Topics  . Smoking status: Former Games developermoker  . Smokeless tobacco: Not on file  . Alcohol Use: No     Comment: occasional    Review of Systems  Constitutional: Negative.  Negative for fever.  Gastrointestinal: Negative.  Negative for abdominal pain and bowel incontinence.  Genitourinary: Negative.  Negative for bladder incontinence and pelvic pain.  Musculoskeletal: Positive for back pain. Negative for gait problem.  Skin: Negative.   Neurological: Negative for tingling, weakness, numbness and paresthesias.    Allergies  Review of patient's allergies indicates no known allergies.  Home Medications   Prior to Admission medications   Medication Sig Start Date End Date Taking? Authorizing Provider  chlorpheniramine-HYDROcodone (TUSSIONEX PENNKINETIC ER) 10-8  MG/5ML LQCR Take 5 mLs by mouth every 12 (twelve) hours as needed. 04/25/13   Linna HoffJames D Kindl, MD  cyclobenzaprine (FLEXERIL) 5 MG tablet Take 1 tablet (5 mg total) by mouth 3 (three) times daily as needed for muscle spasms. 09/30/14   Linna HoffJames D Kindl, MD  HYDROcodone-acetaminophen (NORCO/VICODIN) 5-325 MG per tablet Take 1 tablet by mouth every 6 (six) hours as needed.    Historical Provider, MD  ibuprofen (ADVIL,MOTRIN) 800 MG tablet Take 1 tablet (800 mg total) by mouth 3 (three) times daily. 06/19/12   Glynn OctaveStephen Rancour, MD  ipratropium (ATROVENT) 0.06 % nasal spray Place 2 sprays into the nose 4 (four) times daily. 04/25/13   Linna HoffJames D Kindl, MD  Menthol, Topical Analgesic, (BIOFREEZE) 4 % GEL Apply 1 application topically 3 (three) times daily as needed. For pain    Historical Provider, MD  Menthol-Methyl Salicylate (MUSCLE RUB) 10-15 % CREA Apply 1 application topically 3 (three) times daily as needed. For back pain     Historical Provider, MD  methylPREDNISolone (MEDROL DOSEPAK) 4 MG tablet follow package directions start on mon, take until finished 09/30/14   Linna HoffJames D Kindl, MD  naproxen sodium (ANAPROX) 220 MG tablet Take 440 mg by mouth 2 (two) times daily as needed. For pain      Historical Provider, MD  traMADol (ULTRAM) 50 MG tablet Take 1 tablet (50 mg total) by mouth every 6 (six) hours as needed. For back pain 09/30/14   Linna HoffJames D Kindl, MD   BP 130/84 mmHg  Pulse 67  Temp(Src) 99.1 F (37.3 C) (Oral)  Resp 16  SpO2 97% Physical Exam  Constitutional: He is oriented to  person, place, and time. He appears well-developed and well-nourished. He appears distressed.  Abdominal: Soft. Bowel sounds are normal.  Musculoskeletal: He exhibits tenderness.       Lumbar back: He exhibits decreased range of motion, tenderness, pain and spasm. He exhibits no bony tenderness, no swelling and normal pulse.       Back:  Neurological: He is alert and oriented to person, place, and time.  Skin: Skin is warm  and dry.  Nursing note and vitals reviewed.   ED Course  Procedures (including critical care time) Labs Review Labs Reviewed - No data to display  Imaging Review No results found.   MDM   1. Bilateral low back pain without sciatica        Linna Hoff, MD 09/30/14 [phone]

## 2014-09-30 NOTE — ED Notes (Signed)
Back  Pain    X  6  Days  And  Felt  Back        denys  Any  specefic        Injury       History  No  Urinary    Symptoms               No  Numbness  Or  Tingling

## 2014-09-30 NOTE — Discharge Instructions (Signed)
Use medicine as prescribed and see orthopedist for further problems. °

## 2014-12-31 ENCOUNTER — Other Ambulatory Visit: Payer: Self-pay | Admitting: Specialist

## 2014-12-31 DIAGNOSIS — M545 Low back pain: Secondary | ICD-10-CM

## 2015-01-15 ENCOUNTER — Ambulatory Visit
Admission: RE | Admit: 2015-01-15 | Discharge: 2015-01-15 | Disposition: A | Discharge status: Home / Self Care | Payer: 59 | Source: Ambulatory Visit | Attending: Specialist | Admitting: Specialist

## 2015-01-15 DIAGNOSIS — M545 Low back pain: Secondary | ICD-10-CM

## 2019-06-23 ENCOUNTER — Other Ambulatory Visit: Payer: Self-pay

## 2019-06-23 ENCOUNTER — Inpatient Hospital Stay (HOSPITAL_COMMUNITY)
Admission: EM | Admit: 2019-06-23 | Discharge: 2019-06-25 | DRG: 159 | Disposition: A | Discharge status: Home / Self Care | Payer: BC Managed Care – PPO | Attending: Otolaryngology | Admitting: Otolaryngology

## 2019-06-23 ENCOUNTER — Emergency Department (HOSPITAL_COMMUNITY): Payer: BC Managed Care – PPO

## 2019-06-23 ENCOUNTER — Encounter (HOSPITAL_COMMUNITY): Payer: Self-pay

## 2019-06-23 DIAGNOSIS — K099 Cyst of oral region, unspecified: Principal | ICD-10-CM | POA: Diagnosis present

## 2019-06-23 DIAGNOSIS — Z20828 Contact with and (suspected) exposure to other viral communicable diseases: Secondary | ICD-10-CM | POA: Diagnosis present

## 2019-06-23 DIAGNOSIS — J392 Other diseases of pharynx: Secondary | ICD-10-CM | POA: Diagnosis present

## 2019-06-23 DIAGNOSIS — J029 Acute pharyngitis, unspecified: Secondary | ICD-10-CM | POA: Diagnosis not present

## 2019-06-23 DIAGNOSIS — F17210 Nicotine dependence, cigarettes, uncomplicated: Secondary | ICD-10-CM | POA: Diagnosis present

## 2019-06-23 LAB — CBC WITH DIFFERENTIAL/PLATELET
Abs Immature Granulocytes: 0.07 10*3/uL (ref 0.00–0.07)
Basophils Absolute: 0 10*3/uL (ref 0.0–0.1)
Basophils Relative: 0 %
Eosinophils Absolute: 0 10*3/uL (ref 0.0–0.5)
Eosinophils Relative: 0 %
HCT: 45.8 % (ref 39.0–52.0)
Hemoglobin: 14.9 g/dL (ref 13.0–17.0)
Immature Granulocytes: 1 %
Lymphocytes Relative: 11 %
Lymphs Abs: 1.4 10*3/uL (ref 0.7–4.0)
MCH: 27.7 pg (ref 26.0–34.0)
MCHC: 32.5 g/dL (ref 30.0–36.0)
MCV: 85.3 fL (ref 80.0–100.0)
Monocytes Absolute: 0.9 10*3/uL (ref 0.1–1.0)
Monocytes Relative: 7 %
Neutro Abs: 10.6 10*3/uL — ABNORMAL HIGH (ref 1.7–7.7)
Neutrophils Relative %: 81 %
Platelets: 321 10*3/uL (ref 150–400)
RBC: 5.37 MIL/uL (ref 4.22–5.81)
RDW: 14.6 % (ref 11.5–15.5)
WBC: 13 10*3/uL — ABNORMAL HIGH (ref 4.0–10.5)
nRBC: 0 % (ref 0.0–0.2)

## 2019-06-23 LAB — BASIC METABOLIC PANEL
Anion gap: 13 (ref 5–15)
BUN: 14 mg/dL (ref 6–20)
CO2: 24 mmol/L (ref 22–32)
Calcium: 9.7 mg/dL (ref 8.9–10.3)
Chloride: 102 mmol/L (ref 98–111)
Creatinine, Ser: 0.84 mg/dL (ref 0.61–1.24)
GFR calc Af Amer: >60 mL/min (ref >60)
GFR calc non Af Amer: >60 mL/min (ref >60)
Glucose, Bld: 107 mg/dL — ABNORMAL HIGH (ref 70–99)
Potassium: 3.8 mmol/L (ref 3.5–5.1)
Sodium: 139 mmol/L (ref 135–145)

## 2019-06-23 MED ORDER — DEXAMETHASONE SODIUM PHOSPHATE 10 MG/ML IJ SOLN
10.0000 mg | Freq: Two times a day (BID) | INTRAMUSCULAR | Status: DC
  Administered 2019-06-24 – 2019-06-25 (×3): 10 mg via INTRAVENOUS
  Filled 2019-06-23 (×5): qty 1

## 2019-06-23 MED ORDER — ACETAMINOPHEN 325 MG PO TABS
650.0000 mg | ORAL_TABLET | Freq: Four times a day (QID) | ORAL | Status: DC | PRN
  Administered 2019-06-24 – 2019-06-25 (×4): 650 mg via ORAL
  Filled 2019-06-23 (×5): qty 2

## 2019-06-23 MED ORDER — IBUPROFEN 400 MG PO TABS
400.0000 mg | ORAL_TABLET | Freq: Four times a day (QID) | ORAL | Status: DC | PRN
  Administered 2019-06-24 – 2019-06-25 (×5): 400 mg via ORAL
  Filled 2019-06-23 (×6): qty 1

## 2019-06-23 MED ORDER — MORPHINE SULFATE (PF) 4 MG/ML IV SOLN
4.0000 mg | Freq: Once | INTRAVENOUS | Status: AC
  Administered 2019-06-23: 4 mg via INTRAVENOUS
  Filled 2019-06-23: qty 1

## 2019-06-23 MED ORDER — ACETAMINOPHEN 650 MG RE SUPP: 650.0000 mg | Freq: Four times a day (QID) | RECTAL | Status: DC | PRN

## 2019-06-23 MED ORDER — KCL IN DEXTROSE-NACL 20-5-0.45 MEQ/L-%-% IV SOLN
INTRAVENOUS | Status: DC
  Administered 2019-06-24 – 2019-06-25 (×3): via INTRAVENOUS
  Filled 2019-06-23 (×5): qty 1000

## 2019-06-23 MED ORDER — CLINDAMYCIN PHOSPHATE 600 MG/50ML IV SOLN
600.0000 mg | Freq: Four times a day (QID) | INTRAVENOUS | Status: DC
  Administered 2019-06-24 – 2019-06-25 (×6): 600 mg via INTRAVENOUS
  Filled 2019-06-23 (×7): qty 50

## 2019-06-23 MED ORDER — HYDROCODONE-ACETAMINOPHEN 5-325 MG PO TABS
1.0000 | ORAL_TABLET | Freq: Once | ORAL | Status: AC
  Administered 2019-06-23: 1 via ORAL
  Filled 2019-06-23: qty 1

## 2019-06-23 MED ORDER — IOHEXOL 300 MG/ML  SOLN
75.0000 mL | Freq: Once | INTRAMUSCULAR | Status: AC | PRN
  Administered 2019-06-23: 75 mL via INTRAVENOUS

## 2019-06-23 MED ORDER — KETOROLAC TROMETHAMINE 30 MG/ML IJ SOLN
30.0000 mg | Freq: Once | INTRAMUSCULAR | Status: AC
  Administered 2019-06-23: 30 mg via INTRAVENOUS
  Filled 2019-06-23: qty 1

## 2019-06-23 NOTE — ED Notes (Signed)
Pt transported to CT ?

## 2019-06-23 NOTE — ED Provider Notes (Signed)
[facility] Provider Note   CSN: [id number] Arrival date & time: 06/23/19  1355     History   Chief Complaint Chief Complaint  Patient presents with  . Sore Throat  . Headache    HPI [facility] is a 33 y.o. male.     Patient is a 33 year old male with no significant past medical history presenting to the emergency department for sore throat.  Reports that this began about 3 weeks ago.  He has been seen at urgent care 3 times for the same and was given a course of amoxicillin which she finished and then started prednisone just today.  Reports that it hurts when he swallows and he feels like his submandibular space has gotten completely swollen.  Reports that it hurts when he tries to move his tongue and it feels like his tongue is swollen.  Denies any allergies to any medication denies any rash, denies any fever, denies any nausea, vomiting, chills.  Reports that he has had change in his voice.     History reviewed. No pertinent past medical history.  There are no active problems to display for this patient.   History reviewed. No pertinent surgical history.      Home Medications    Prior to Admission medications   Medication Sig Start Date End Date Taking? Authorizing Provider  chlorpheniramine-HYDROcodone (TUSSIONEX PENNKINETIC ER) 10-8 MG/5ML LQCR Take 5 mLs by mouth every 12 (twelve) hours as needed. 04/25/13   Billy Fischer, MD  cyclobenzaprine (FLEXERIL) 5 MG tablet Take 1 tablet (5 mg total) by mouth 3 (three) times daily as needed for muscle spasms. 09/30/14   Billy Fischer, MD  HYDROcodone-acetaminophen (NORCO/VICODIN) 5-325 MG per tablet Take 1 tablet by mouth every 6 (six) hours as needed.    [provider]  ibuprofen (ADVIL,MOTRIN) 800 MG tablet Take 1 tablet (800 mg total) by mouth 3 (three) times daily. 06/19/12   Rancour, Annie Main, MD  ipratropium (ATROVENT) 0.06 % nasal spray Place 2 sprays into the nose  4 (four) times daily. 04/25/13   Billy Fischer, MD  Menthol, Topical Analgesic, (BIOFREEZE) 4 % GEL Apply 1 application topically 3 (three) times daily as needed. For pain    [provider]  Menthol-Methyl Salicylate (MUSCLE RUB) 10-15 % CREA Apply 1 application topically 3 (three) times daily as needed. For back pain     [provider]  methylPREDNISolone (MEDROL DOSEPAK) 4 MG tablet follow package directions start on mon, take until finished 09/30/14   Billy Fischer, MD  naproxen sodium (ANAPROX) 220 MG tablet Take 440 mg by mouth 2 (two) times daily as needed. For pain      [provider]  traMADol (ULTRAM) 50 MG tablet Take 1 tablet (50 mg total) by mouth every 6 (six) hours as needed. For back pain 09/30/14   Billy Fischer, MD    Family History History reviewed. No pertinent family history.  Social History Social History   Tobacco Use  . Smoking status: Current Every Day Smoker    Packs/day: 1.00    Types: Cigarettes  . Smokeless tobacco: Never Used  Substance Use Topics  . Alcohol use: No    Comment: occasional  . Drug use: No     Allergies   Patient has no known allergies.   Review of Systems Review of Systems  Constitutional: Negative for chills, fatigue and fever.  HENT: Positive for sore throat, trouble swallowing and voice  change. Negative for congestion, dental problem, ear pain, mouth sores, nosebleeds, sinus pressure, sneezing and tinnitus.   Respiratory: Negative for cough and shortness of breath.   Gastrointestinal: Negative for nausea and vomiting.  Musculoskeletal: Negative for neck pain and neck stiffness.  Skin: Negative for rash and wound.  Allergic/Immunologic: Negative for immunocompromised state.  Neurological: Negative for dizziness, light-headedness and headaches.  Hematological: Does not bruise/bleed easily.     Physical Exam Updated Vital Signs BP (!) 156/82   Pulse 73   Temp 99.5 F (37.5 C) (Oral)   Resp 18    Ht 6\' 3"  (1.905 m)   Wt (!) 147.4 kg   SpO2 100%   BMI 40.62 kg/m   Physical Exam Vitals signs and nursing note reviewed.  Constitutional:      General: He is not in acute distress.    Appearance: Normal appearance. He is well-developed. He is not ill-appearing, toxic-appearing or diaphoretic.  HENT:     Head: Normocephalic.     Comments: Patient's anterior neck and submandibular space are swollen and tender to palpation.  The floor of the patient's mouth is also tender to palpation.  Tongue is normal, pharynx is normal.  There are anterior cervical enlarged lymph nodes.  The submandibular space is somewhat hard but not brawny.  Patient has a slight hot potato voice    Mouth/Throat:     Lips: Pink.     Mouth: Mucous membranes are moist.     Pharynx: Oropharynx is clear. Uvula midline. No pharyngeal swelling, oropharyngeal exudate, posterior oropharyngeal erythema or uvula swelling.  Eyes:     Conjunctiva/sclera: Conjunctivae normal.  Cardiovascular:     Rate and Rhythm: Normal rate.  Pulmonary:     Effort: Pulmonary effort is normal.  Skin:    General: Skin is dry.  Neurological:     Mental Status: He is alert.  Psychiatric:        Mood and Affect: Mood normal.      ED Treatments / Results  Labs (all labs ordered are listed, but only abnormal results are displayed) Labs Reviewed  CBC WITH DIFFERENTIAL/PLATELET  BASIC METABOLIC PANEL    EKG None  Radiology No results found.  Procedures Procedures (including critical care time)  Medications Ordered in ED Medications - No data to display   Initial Impression / Assessment and Plan / ED Course  I have reviewed the triage vital signs and the nursing notes.  Pertinent labs & imaging results that were available during my care of the patient were reviewed by me and considered in my medical decision making (see chart for details).  Clinical Course as of Jun 24 739  Fri Jun 23, 2019  2033 Patient presents with 2  weeks of progressively worsening pain and swelling to the throat and submandibular space.  Protecting airway.  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  Pain is improved with hydrocodone, white count of 13.  Waiting on consult from ENT   [KM]  2141 Spoke with Dr. Pollyann Kennedyosen with ENT who reports he will come down to see patient and review scan. [facility] will [facility] after. Patient has normal airway at this time and pain controlled.    [KM]    Clinical Course User Index [KM] Arlyn DunningMcLean, Brizeyda Holtmeyer A, PA-C       Care signed out to Elpidio AnisShari Upstill PA due to change of shift  Final Clinical Impressions(s) / ED Diagnoses  Final diagnoses:  None    ED Discharge Orders    None       Jeral Pinch 06/24/19 3354    Gwyneth Sprout, MD 06/24/19 1537

## 2019-06-23 NOTE — Progress Notes (Signed)
Arrived to 6n18 from ED at this time. C/O sore throat, yanker set up. Oriented to room and surroundings

## 2019-06-23 NOTE — ED Provider Notes (Signed)
ST, trouble swallowing x 3 weeks despite amoxil and steroids CT shows mass in throat and tongue ?infected thyroglossal cyst ENT - Dr. Constance Holster to see in ED  10:30 - Dr. Constance Holster has seen the patient in the ED and is admitting for observation and further care.    Charlann Lange, PA-C 06/23/19 2247    Blanchie Dessert, MD 06/24/19 1537

## 2019-06-23 NOTE — H&P (Signed)
Alexander Roberts is an 32 y.o. male.   Chief Complaint: Sore throat, difficulty swallowing HPI: Previously in good health, about 2 weeks ago developed a bad sore throat.  It did not lateralize to either side.  He was seen in urgent care center and treated with Augmentin which he took for about 7 days.  The pain subsided somewhat.  About 3 days ago he developed swelling sensation of the back of the tongue.  It has started affecting his speech and has made it very difficult for him to swallow anything.  It has progressively gotten worse over the past 48 hours.  No prior history of throat problems.  He smokes about half pack per day.  CT scan was performed in the ER.  He is not having any difficulty breathing.  He is able to lie supine on his back.  His significant other says that he has always snored but the last few days it has been much worse.  He has not really eaten much of anything the last 2 days but he has been able to drink although that is getting more difficult now.  History reviewed. No pertinent past medical history.  History reviewed. No pertinent surgical history.  History reviewed. No pertinent family history. Social History:  reports that he has been smoking cigarettes. He has been smoking about 1.00 pack per day. He has never used smokeless tobacco. He reports that he does not drink alcohol or use drugs.  Allergies: No Known Allergies  (Not in a hospital admission)   Results for orders placed or performed during the hospital encounter of 06/23/19 (from the past 48 hour(s))  CBC with Differential     Status: Abnormal   Collection Time: 06/23/19  5:46 PM  Result Value Ref Range   WBC 13.0 (H) 4.0 - 10.5 K/uL   RBC 5.37 4.22 - 5.81 MIL/uL   Hemoglobin 14.9 13.0 - 17.0 g/dL   HCT 45.8 39.0 - 52.0 %   MCV 85.3 80.0 - 100.0 fL   MCH 27.7 26.0 - 34.0 pg   MCHC 32.5 30.0 - 36.0 g/dL   RDW 14.6 11.5 - 15.5 %   Platelets 321 150 - 400 K/uL   nRBC 0.0 0.0 - 0.2 %   Neutrophils  Relative % 81 %   Neutro Abs 10.6 (H) 1.7 - 7.7 K/uL   Lymphocytes Relative 11 %   Lymphs Abs 1.4 0.7 - 4.0 K/uL   Monocytes Relative 7 %   Monocytes Absolute 0.9 0.1 - 1.0 K/uL   Eosinophils Relative 0 %   Eosinophils Absolute 0.0 0.0 - 0.5 K/uL   Basophils Relative 0 %   Basophils Absolute 0.0 0.0 - 0.1 K/uL   Immature Granulocytes 1 %   Abs Immature Granulocytes 0.07 0.00 - 0.07 K/uL    Comment: Performed at [facility], [address] [place], [place] [address]  Basic metabolic panel     Status: Abnormal   Collection Time: 06/23/19  5:46 PM  Result Value Ref Range   Sodium 139 135 - 145 mmol/L   Potassium 3.8 3.5 - 5.1 mmol/L   Chloride 102 98 - 111 mmol/L   CO2 24 22 - 32 mmol/L   Glucose, Bld 107 (H) 70 - 99 mg/dL   BUN 14 6 - 20 mg/dL   Creatinine, Ser 0.84 0.61 - 1.24 mg/dL   Calcium 9.7 8.9 - 10.3 mg/dL   GFR calc non Af Amer >60 >60 mL/min   GFR calc  Af Amer >60 >60 mL/min   Anion gap 13 5 - 15    Comment: Performed at [facility], [address] [place], [place] [id number]   Ct Soft Tissue Neck W Contrast  Result Date: 06/23/2019 CLINICAL DATA:  Abscess of pharynx.  Sore throat 2 weeks. EXAM: CT NECK WITH CONTRAST TECHNIQUE: Multidetector CT imaging of the neck was performed using the standard protocol following the bolus administration of intravenous contrast. CONTRAST:  75mL OMNIPAQUE IOHEXOL 300 MG/ML  SOLN COMPARISON:  None. FINDINGS: Pharynx and larynx: Mass in the central tongue measures 3.8 x 3.9 x 3.3 cm. There is thick wall enhancement with central necrosis. Mass is centered in the midline extending into the tongue bilaterally. Pharynx otherwise normal.  Epiglottis and larynx normal. Salivary glands: No inflammation, mass, or stone. Thyroid: Negative Lymph nodes: No enlarged lymph nodes in the neck. Vascular: Normal vascular enhancement Limited intracranial: Negative Visualized orbits: Negative Mastoids and visualized paranasal sinuses: Negative  Skeleton: Negative Upper chest: Negative Other: None IMPRESSION: Large mass in the central tongue with thickened enhancing wall and central necrosis. The patient has pain and mildly elevated white blood count and has recently been antibiotics. This may represent infection. Favor infected thyroglossal duct cyst. Carcinoma of the tongue also a consideration. These results were called by telephone at the time of interpretation on 06/23/2019 at 7:39 pm to provider [facility] , who verbally acknowledged these results. Electronically Signed   By: Marlan Palauharles  Clark M.D.   On: 06/23/2019 19:58    ROS: otherwise negative  Blood pressure 132/85, pulse (!) 57, temperature 99.5 F (37.5 C), temperature source Oral, resp. rate 20, height 6\' 3"  (1.905 m), weight (!) 147.4 kg, SpO2 100 %.  PHYSICAL EXAM: Overall appearance:  Healthy appearing, in no distress.  There is no stridor or respiratory distress.  He is able to handle secretions although it is uncomfortable for him to swallow.  He has slight dysarthria. Head:  Normocephalic, atraumatic. Ears: External auditory canals are clear; tympanic membranes are intact and the middle ears are free of any effusion. Nose: External nose is healthy in appearance. Internal nasal exam free of any lesions or obstruction. Oral Cavity/pharynx:  There are no mucosal lesions or masses identified.  He has restricted mobility of his tongue due to presumably submucosal swelling.  There is a bluish edematous discoloration of the floor of mouth but it is not firm or indurated.  The tongue base is firm to palpation and a little bit tender. Neuro:  No identifiable neurologic deficits. Neck: Mild but tender adenopathy of the submental and submandibular areas bilaterally.  No other neck masses palpable..  Studies Reviewed: CT neck reviewed.        Assessment/Plan History and radiographic findings as well as clinical exam are consistent with an infected tongue base cyst, possibly  thyroglossal duct remnant.  The airway is not an issue at the moment but certainly could be in the future if this gets worse.  Recommend admission to the hospital.  Recommend we start him on intravenous antibiotics and steroids.  Recommend intravenous fluid support.  Monitor blood counts and symptoms as well as his airway status.  We discussed the possibility of this getting worse and requiring incision and drainage and possibly even tracheostomy.  If he resolves clinically and can take oral diet and go home on oral antibiotics and I would recommend a follow-up CT in a couple weeks to see if there is still a remnant of a cyst  that would have to be removed.  Serena Colonel 06/23/2019, 10:19 PM

## 2019-06-23 NOTE — ED Triage Notes (Signed)
Pt arrived POV complaining of sore throat and a headache x 2 weeks. Pt was seen at urgent care 3 times. Pt was tested for covid, strep and the flu all were negative. Pt states symptoms are worse. Pt feels like his throat and tongue are swollen and it is hard to swallow. Pt states he has pressure in his ears and on both sides of his face. Pt denies N/V and dizziness. Pt was prescribed amoxicillin and prednisone. Pt started the prednisone this morning. Pt took amoxicillin for 6 days but was told to stop taking it by urgent care.

## 2019-06-24 LAB — CBC WITH DIFFERENTIAL/PLATELET
Abs Immature Granulocytes: 0.03 10*3/uL (ref 0.00–0.07)
Basophils Absolute: 0 10*3/uL (ref 0.0–0.1)
Basophils Relative: 0 %
Eosinophils Absolute: 0 10*3/uL (ref 0.0–0.5)
Eosinophils Relative: 0 %
HCT: 38.9 % — ABNORMAL LOW (ref 39.0–52.0)
Hemoglobin: 12.7 g/dL — ABNORMAL LOW (ref 13.0–17.0)
Immature Granulocytes: 0 %
Lymphocytes Relative: 23 %
Lymphs Abs: 2.5 10*3/uL (ref 0.7–4.0)
MCH: 27.6 pg (ref 26.0–34.0)
MCHC: 32.6 g/dL (ref 30.0–36.0)
MCV: 84.6 fL (ref 80.0–100.0)
Monocytes Absolute: 1.2 10*3/uL — ABNORMAL HIGH (ref 0.1–1.0)
Monocytes Relative: 11 %
Neutro Abs: 7.1 10*3/uL (ref 1.7–7.7)
Neutrophils Relative %: 66 %
Platelets: 297 10*3/uL (ref 150–400)
RBC: 4.6 MIL/uL (ref 4.22–5.81)
RDW: 14.6 % (ref 11.5–15.5)
WBC: 10.9 10*3/uL — ABNORMAL HIGH (ref 4.0–10.5)
nRBC: 0 % (ref 0.0–0.2)

## 2019-06-24 LAB — HIV ANTIBODY (ROUTINE TESTING W REFLEX): HIV Screen 4th Generation wRfx: NONREACTIVE

## 2019-06-24 LAB — BASIC METABOLIC PANEL
Anion gap: 10 (ref 5–15)
BUN: 15 mg/dL (ref 6–20)
CO2: 27 mmol/L (ref 22–32)
Calcium: 8.9 mg/dL (ref 8.9–10.3)
Chloride: 102 mmol/L (ref 98–111)
Creatinine, Ser: 0.94 mg/dL (ref 0.61–1.24)
GFR calc Af Amer: >60 mL/min (ref >60)
GFR calc non Af Amer: >60 mL/min (ref >60)
Glucose, Bld: 94 mg/dL (ref 70–99)
Potassium: 3.8 mmol/L (ref 3.5–5.1)
Sodium: 139 mmol/L (ref 135–145)

## 2019-06-24 LAB — SARS CORONAVIRUS 2 (TAT 6-24 HRS): SARS Coronavirus 2: NEGATIVE

## 2019-06-24 MED ORDER — PHENOL 1.4 % MT LIQD
1.0000 | OROMUCOSAL | Status: DC | PRN
  Administered 2019-06-24: 1 via OROMUCOSAL
  Filled 2019-06-24: qty 177

## 2019-06-24 MED ORDER — KETOROLAC TROMETHAMINE 30 MG/ML IJ SOLN
30.0000 mg | Freq: Once | INTRAMUSCULAR | Status: AC
  Administered 2019-06-24: 30 mg via INTRAVENOUS
  Filled 2019-06-24: qty 1

## 2019-06-24 NOTE — Plan of Care (Signed)

## 2019-06-24 NOTE — Plan of Care (Signed)

## 2019-06-24 NOTE — Progress Notes (Signed)
Patient ID: Alexander Roberts, male   DOB: Feb 27, 1986, 33 y.o.   MRN: [id number] Subjective: He is feeling much better this morning.  Much less pain.  He is able to move his tongue a lot better.  He feels that he can probably eat or drink now.  Objective: Vital signs in last 24 hours: Temp:  [98.4 F (36.9 C)-99.5 F (37.5 C)] 98.4 F (36.9 C) (10/24 0510) Pulse Rate:  [49-73] 60 (10/24 0510) Resp:  [18-22] 22 (10/23 2306) BP: (110-156)/(60-89) 130/89 (10/24 0510) SpO2:  [94 %-100 %] 94 % (10/24 0510) Weight:  [147.4 kg] 147.4 kg (10/23 1413) Weight change:  Last BM Date: 06/23/19  Intake/Output from previous day: 10/23 0701 - 10/24 0700 In: 786.1 [I.V.:586.1; IV Piggyback:200] Out: -  Intake/Output this shift: No intake/output data recorded.  PHYSICAL EXAM: He is awake and alert.  His speech is more easy to understand now.  He still a little bit tender in the anterior neck but it is a little bit less full than it was last night.  Floor of mouth still has very mild edema but no more discoloration.  His tongue mobility is improved but still not normal.  Pharynx is clear.  His breathing is clear as well.  He is in no distress.  He is handling secretions very nicely.  Lab Results: Recent Labs    06/23/19 1746 06/24/19 0438  WBC 13.0* 10.9*  HGB 14.9 12.7*  HCT 45.8 38.9*  PLT 321 297   BMET Recent Labs    06/23/19 1746 06/24/19 0438  NA 139 139  K 3.8 3.8  CL 102 102  CO2 24 27  GLUCOSE 107* 94  BUN 14 15  CREATININE 0.84 0.94  CALCIUM 9.7 8.9    Studies/Results: Ct Soft Tissue Neck W Contrast  Result Date: 06/23/2019 CLINICAL DATA:  Abscess of pharynx.  Sore throat 2 weeks. EXAM: CT NECK WITH CONTRAST TECHNIQUE: Multidetector CT imaging of the neck was performed using the standard protocol following the bolus administration of intravenous contrast. CONTRAST:  53mL OMNIPAQUE IOHEXOL 300 MG/ML  SOLN COMPARISON:  None. FINDINGS: Pharynx and larynx: Mass in the central  tongue measures 3.8 x 3.9 x 3.3 cm. There is thick wall enhancement with central necrosis. Mass is centered in the midline extending into the tongue bilaterally. Pharynx otherwise normal.  Epiglottis and larynx normal. Salivary glands: No inflammation, mass, or stone. Thyroid: Negative Lymph nodes: No enlarged lymph nodes in the neck. Vascular: Normal vascular enhancement Limited intracranial: Negative Visualized orbits: Negative Mastoids and visualized paranasal sinuses: Negative Skeleton: Negative Upper chest: Negative Other: None IMPRESSION: Large mass in the central tongue with thickened enhancing wall and central necrosis. The patient has pain and mildly elevated white blood count and has recently been antibiotics. This may represent infection. Favor infected thyroglossal duct cyst. Carcinoma of the tongue also a consideration. These results were called by telephone at the time of interpretation on 06/23/2019 at 7:39 pm to provider [facility] , who verbally acknowledged these results. Electronically Signed   By: Franchot Gallo M.D.   On: 06/23/2019 19:58    Medications: I have reviewed the patient's current medications.  Assessment/Plan: Improving on antibiotics and IV steroids.  We will start him on a regular diet today as tolerated.  Continue to monitor his clinical situation.  White blood cell count has improved.  Possibility for discharge tomorrow if he continues to improve significantly.  LOS: 0 days   Izora Gala 06/24/2019, 10:36 AM

## 2019-06-24 NOTE — Progress Notes (Signed)
Called on call MD about pt. rating pain 10/10 and was given a verbal order for 30 mg Toradol once. Will administer and continue to monitor pt.

## 2019-06-25 DIAGNOSIS — K099 Cyst of oral region, unspecified: Secondary | ICD-10-CM | POA: Diagnosis present

## 2019-06-25 DIAGNOSIS — Z20828 Contact with and (suspected) exposure to other viral communicable diseases: Secondary | ICD-10-CM | POA: Diagnosis present

## 2019-06-25 DIAGNOSIS — J029 Acute pharyngitis, unspecified: Secondary | ICD-10-CM | POA: Diagnosis present

## 2019-06-25 DIAGNOSIS — F17210 Nicotine dependence, cigarettes, uncomplicated: Secondary | ICD-10-CM | POA: Diagnosis present

## 2019-06-25 MED ORDER — PREDNISONE 10 MG (21) PO TBPK: ORAL_TABLET | ORAL | 0 refills | Status: DC

## 2019-06-25 MED ORDER — PREDNISONE 10 MG (21) PO TBPK: 10.0000 mg | ORAL_TABLET | ORAL | Status: DC

## 2019-06-25 MED ORDER — PREDNISONE 10 MG (21) PO TBPK: 10.0000 mg | ORAL_TABLET | Freq: Four times a day (QID) | ORAL | Status: DC

## 2019-06-25 MED ORDER — PREDNISONE 10 MG (21) PO TBPK: 20.0000 mg | ORAL_TABLET | Freq: Every evening | ORAL | Status: DC

## 2019-06-25 MED ORDER — PREDNISONE 10 MG (21) PO TBPK: 10.0000 mg | ORAL_TABLET | Freq: Three times a day (TID) | ORAL | Status: DC

## 2019-06-25 MED ORDER — PREDNISONE 10 MG (21) PO TBPK
20.0000 mg | ORAL_TABLET | Freq: Every morning | ORAL | Status: AC
  Administered 2019-06-25: 20 mg via ORAL
  Filled 2019-06-25: qty 21

## 2019-06-25 MED ORDER — FAMOTIDINE 20 MG PO TABS
20.0000 mg | ORAL_TABLET | Freq: Two times a day (BID) | ORAL | Status: DC
  Administered 2019-06-25: 20 mg via ORAL
  Filled 2019-06-25: qty 1

## 2019-06-25 MED ORDER — CLINDAMYCIN HCL 300 MG PO CAPS
300.0000 mg | ORAL_CAPSULE | Freq: Three times a day (TID) | ORAL | Status: DC
  Administered 2019-06-25 (×2): 300 mg via ORAL
  Filled 2019-06-25 (×2): qty 1

## 2019-06-25 MED ORDER — CALCIUM CARBONATE ANTACID 500 MG PO CHEW
1.0000 | CHEWABLE_TABLET | Freq: Four times a day (QID) | ORAL | Status: DC | PRN
  Administered 2019-06-25: 200 mg via ORAL
  Filled 2019-06-25: qty 1

## 2019-06-25 MED ORDER — PREDNISONE 10 MG (21) PO TBPK
10.0000 mg | ORAL_TABLET | ORAL | Status: AC
  Administered 2019-06-25: 10 mg via ORAL

## 2019-06-25 MED ORDER — CLINDAMYCIN HCL 300 MG PO CAPS: 300.0000 mg | ORAL_CAPSULE | Freq: Three times a day (TID) | ORAL | 0 refills | Status: DC

## 2019-06-25 NOTE — Progress Notes (Signed)
Patient ID: Alexander Roberts, male   DOB: [birth year], 33 y.o.   MRN: [id number] Subjective: He had one episode of pain again late last night.  Otherwise is feeling much better today.  He feels that his speech is normal now and his tongue mobility is pretty close to normal.  He is eating much better as well.  Objective: Vital signs in last 24 hours: Temp:  [97.7 F (36.5 C)-98.7 F (37.1 C)] 98.6 F (37 C) (10/25 0521) Pulse Rate:  [44-56] 46 (10/25 0521) Resp:  [20] 20 (10/24 1559) BP: (130-140)/(57-82) 136/57 (10/25 0521) SpO2:  [98 %-100 %] 100 % (10/25 0521) Weight change:  Last BM Date: 06/23/19  Intake/Output from previous day: 10/24 0701 - 10/25 0700 In: 360 [P.O.:360] Out: -  Intake/Output this shift: Total I/O In: 240 [P.O.:240] Out: -   PHYSICAL EXAM: He is awake and alert.  Speech is excellent.  He is able to move his tongue normally now.  Much less fullness.  Floor of mouth looks normal today.  Lab Results: Recent Labs    06/23/19 1746 06/24/19 0438  WBC 13.0* 10.9*  HGB 14.9 12.7*  HCT 45.8 38.9*  PLT 321 297   BMET Recent Labs    06/23/19 1746 06/24/19 0438  NA 139 139  K 3.8 3.8  CL 102 102  CO2 24 27  GLUCOSE 107* 94  BUN 14 15  CREATININE 0.84 0.94  CALCIUM 9.7 8.9    Studies/Results: Ct Soft Tissue Neck W Contrast  Result Date: 06/23/2019 CLINICAL DATA:  Abscess of pharynx.  Sore throat 2 weeks. EXAM: CT NECK WITH CONTRAST TECHNIQUE: Multidetector CT imaging of the neck was performed using the standard protocol following the bolus administration of intravenous contrast. CONTRAST:  72mL OMNIPAQUE IOHEXOL 300 MG/ML  SOLN COMPARISON:  None. FINDINGS: Pharynx and larynx: Mass in the central tongue measures 3.8 x 3.9 x 3.3 cm. There is thick wall enhancement with central necrosis. Mass is centered in the midline extending into the tongue bilaterally. Pharynx otherwise normal.  Epiglottis and larynx normal. Salivary glands: No inflammation, mass, or  stone. Thyroid: Negative Lymph nodes: No enlarged lymph nodes in the neck. Vascular: Normal vascular enhancement Limited intracranial: Negative Visualized orbits: Negative Mastoids and visualized paranasal sinuses: Negative Skeleton: Negative Upper chest: Negative Other: None IMPRESSION: Large mass in the central tongue with thickened enhancing wall and central necrosis. The patient has pain and mildly elevated white blood count and has recently been antibiotics. This may represent infection. Favor infected thyroglossal duct cyst. Carcinoma of the tongue also a consideration. These results were called by telephone at the time of interpretation on 06/23/2019 at 7:39 pm to provider [facility] , who verbally acknowledged these results. Electronically Signed   By: Franchot Gallo M.D.   On: 06/23/2019 19:58    Medications: I have reviewed the patient's current medications.  Assessment/Plan: Continued improvement.  Recommend we switch to oral antibiotics now and cut back significantly on steroids.  We will switch to oral steroid taper.  If he still doing very well this evening he may be able to go home.  LOS: 0 days   Izora Gala 06/25/2019, 9:16 AM

## 2019-06-25 NOTE — Discharge Summary (Signed)
Physician Discharge Summary  Patient ID: Alexander Roberts MRN: [id number] DOB/AGE: Dec 22, 1985 33 y.o.  Admit date: 06/23/2019 Discharge date: 06/25/2019  Admission Diagnoses:oropharyngeal mass, infected cyst  Discharge Diagnoses:  Active Problems:   Oropharyngeal mass   Discharged Condition: good  Hospital Course: Improved with medical treatment  Consults: none  Significant Diagnostic Studies: none  Treatments: antibiotics: clindamycin  Discharge Exam: Blood pressure (!) 144/86, pulse 62, temperature 98.5 F (36.9 C), temperature source Oral, resp. rate 17, height 6\' 3"  (1.905 m), weight (!) 147.4 kg, SpO2 100 %. PHYSICAL EXAM: Awake and alert, normal speech and breathing, normal tongue mobility  Disposition: Discharge disposition: 01-Home or Self Care       Discharge Instructions    Diet - low sodium heart healthy   Complete by: As directed    Increase activity slowly   Complete by: As directed       Follow-up Information    Izora Gala, MD. Schedule an appointment as soon as possible for a visit in 1 week(s).   Specialty: Otolaryngology Contact information: [address] [place] [address] [phone]           Signed: Izora Gala 06/25/2019, 4:25 PM

## 2019-06-25 NOTE — Plan of Care (Signed)
°  Problem: Education: °Goal: Knowledge of General Education information will improve °Description: Including pain rating scale, medication(s)/side effects and non-pharmacologic comfort measures °Outcome: Progressing °  °Problem: Health Behavior/Discharge Planning: °Goal: Ability to manage health-related needs will improve °Outcome: Progressing °  °Problem: Clinical Measurements: °Goal: Ability to maintain clinical measurements within normal limits will improve °Outcome: Progressing °Goal: Will remain free from infection °Outcome: Progressing °Goal: Diagnostic test results will improve °Outcome: Progressing °  °Problem: Nutrition: °Goal: Adequate nutrition will be maintained °Outcome: Progressing °  °Problem: Coping: °Goal: Level of anxiety will decrease °Outcome: Progressing °  °Problem: Pain Managment: °Goal: General experience of comfort will improve °Outcome: Progressing °  °Problem: Safety: °Goal: Ability to remain free from injury will improve °Outcome: Progressing °  °Problem: Skin Integrity: °Goal: Risk for impaired skin integrity will decrease °Outcome: Progressing °  °

## 2019-06-25 NOTE — Discharge Instructions (Signed)
Use tylenol and/or motrin for pain

## 2019-06-25 NOTE — Progress Notes (Signed)
AVS given and reviewed with pt. Medications discussed and printed prescription provided. All questions answered to satisfaction. Pt verbalized understanding of information given. Pt ambulated off the unit accompanied by wife.

## 2019-07-11 ENCOUNTER — Other Ambulatory Visit: Payer: Self-pay | Admitting: Otolaryngology

## 2019-07-11 DIAGNOSIS — K148 Other diseases of tongue: Secondary | ICD-10-CM

## 2019-07-20 ENCOUNTER — Ambulatory Visit
Admission: RE | Admit: 2019-07-20 | Discharge: 2019-07-20 | Disposition: A | Discharge status: Home / Self Care | Payer: BC Managed Care – PPO | Source: Ambulatory Visit | Attending: Otolaryngology | Admitting: Otolaryngology

## 2019-07-20 DIAGNOSIS — K148 Other diseases of tongue: Secondary | ICD-10-CM

## 2019-07-20 MED ORDER — IOPAMIDOL (ISOVUE-300) INJECTION 61%
75.0000 mL | Freq: Once | INTRAVENOUS | Status: AC | PRN
  Administered 2019-07-20: 75 mL via INTRAVENOUS

## 2019-09-26 NOTE — H&P (Signed)
HPI:   Alexander Roberts is a 34 y.o. male who presents as a return Patient.   Current problem: Follow-up from hospital stay.  HPI: Here for follow-up. Since leaving the hospital he has continued to improve and is now feeling pretty much back to normal. He completed antibiotics a few days ago.  PMH/Meds/All/SocHx/FamHx/ROS:   Past Medical History:  Diagnosis Date  . Chronic midline low back pain with left-sided sciatica  . DDD (degenerative disc disease), lumbosacral with nerve root encroachment  . Lumbar stenosis with neurogenic claudication   History reviewed. No pertinent surgical history.  No family history of bleeding disorders, wound healing problems or difficulty with anesthesia.   Social History   Socioeconomic History  . Marital status: Married  Spouse name: Not on file  . Number of children: Not on file  . Years of education: Not on file  . Highest education level: Not on file  Occupational History  . Not on file  Social Needs  . Financial resource strain: Not on file  . Food insecurity  Worry: Not on file  Inability: Not on file  . Transportation needs  Medical: Not on file  Non-medical: Not on file  Tobacco Use  . Smoking status: Never Smoker  . Smokeless tobacco: Never Used  Substance and Sexual Activity  . Alcohol use: Yes  Comment: Occassionally  . Drug use: No  . Sexual activity: Not on file  Lifestyle  . Physical activity  Days per week: Not on file  Minutes per session: Not on file  . Stress: Not on file  Relationships  . Social Multimedia programmer on phone: Not on file  Gets together: Not on file  Attends religious service: Not on file  Active member of club or organization: Not on file  Attends meetings of clubs or organizations: Not on file  Relationship status: Not on file  Other Topics Concern  . Not on file  Social History Narrative  . Not on file   Current Outpatient Medications:  . CEPACOL SORE THROAT, BENZ-MEN, 15-2.3 mg  lozenge, 1 (ONE) LOZENGE 3 TIMES A DAY AS NEEDED, Disp: , Rfl:  . cetirizine (ZYRTEC) 10 MG tablet, Take 1 tablet (10 mg total) by mouth 3 times daily for 7 days., Disp: 21 tablet, Rfl: 0 . EPINEPHrine (EPI-PEN) 0.3 mg/0.3 mL auto-injector, Inject 0.3 mLs (0.3 mg total) into the muscle once as needed for up to 1 dose for Anaphylaxis., Disp: 1 each, Rfl: 0 . gabapentin (NEURONTIN) 300 MG capsule, Take 1 capsule (300 mg total) by mouth 3 times daily., Disp: 90 capsule, Rfl: 3 . meloxicam (MOBIC) 7.5 MG tablet, Take 1 tablet (7.5 mg total) by mouth daily for 30 days., Disp: 30 tablet, Rfl: 3   Physical Exam:   Healthy-appearing gentleman in no distress. Breathing and voice are normal and clear. Oral cavity and pharynx look excellent. His tongue looks completely normal. Indirect exam of the base of tongue and hypopharynx/larynx are all completely normal as well. There is no palpable adenopathy or swelling.  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 I suspect there is still a small cyst present that will need to be removed. Recommend we set him up for CT scan in the next week or 2. He is instructed to contact us immediately if he develops any new symptoms.

## 2019-10-18 NOTE — Progress Notes (Signed)
[id number] [place], [place] - [address] [place] [address] Phone: [phone] Fax: [phone]  [facility] - [place], [place] - [address] [place] [address] Phone: [phone] Fax: [phone]      Your procedure is scheduled on Monday 10/23/2019.  Report to [facility] Main Entrance "A" at 05:30 A.M., and check in at the Admitting office.  Call this number if you have problems the morning of surgery:  [phone]  Call [phone] if you have any questions prior to your surgery date Monday-Friday 8am-4pm    Remember:  Do not eat or drink after midnight the night before your surgery     Take these medicines the morning of surgery with A SIP OF WATER: Gabapentin (Neurontin)   7 days prior to surgery STOP taking any Meloxicam (Mobic), Aspirin (unless otherwise instructed by your surgeon), Aleve, Naproxen, Ibuprofen, Motrin, Advil, Goody's, BC's, all herbal medications, fish oil, and all vitamins.    The Morning of Surgery  Do not wear jewelry.  Do not wear lotions, powders, colognes, or deodorant  Do not shave 48 hours prior to surgery.  Men may shave face and neck.  Do not bring valuables to the hospital.  [facility] is not responsible for any belongings or valuables.  If you are a smoker, DO NOT Smoke 24 hours prior to surgery  If you wear a CPAP at night please bring your mask the morning of surgery   Remember that you must have someone to transport you home after your surgery, and remain with you for 24 hours if you are discharged the same day.   Please bring cases for contacts, glasses, hearing aids, dentures or bridgework because it cannot be worn into surgery.    Leave your suitcase in the car.  After surgery it may be brought to your room.  For patients admitted to the hospital, discharge time will be determined by your treatment  team.  Patients discharged the day of surgery will not be allowed to drive home.    Special instructions:   [facility]- Preparing For Surgery  Before surgery, you can play an important role. Because skin is not sterile, your skin needs to be as free of germs as possible. You can reduce the number of germs on your skin by washing with CHG (chlorahexidine gluconate) Soap before surgery.  CHG is an antiseptic cleaner which kills germs and bonds with the skin to continue killing germs even after washing.    Oral Hygiene is also important to reduce your risk of infection.  Remember - BRUSH YOUR TEETH THE MORNING OF SURGERY WITH YOUR REGULAR TOOTHPASTE  Please do not use if you have an allergy to CHG or antibacterial soaps. If your skin becomes reddened/irritated stop using the CHG.  Do not shave (including legs and underarms) for at least 48 hours prior to first CHG shower. It is OK to shave your face.  Please follow these instructions carefully.   1. Shower the NIGHT BEFORE SURGERY and the MORNING OF SURGERY with CHG Soap.   2. If you chose to wash your hair, wash your hair first as usual with your normal shampoo.  3. After you shampoo, rinse your hair and body thoroughly to remove the shampoo.  4. Use CHG as you would any other liquid soap. You can apply CHG directly to the skin and wash gently with a scrungie  or a clean washcloth.   5. Apply the CHG Soap to your body ONLY FROM THE NECK DOWN.  Do not use on open wounds or open sores. Avoid contact with your eyes, ears, mouth and genitals (private parts). Wash Face and genitals (private parts)  with your normal soap.   6. Wash thoroughly, paying special attention to the area where your surgery will be performed.  7. Thoroughly rinse your body with warm water from the neck down.  8. DO NOT shower/wash with your normal soap after using and rinsing off the CHG Soap.  9. Pat yourself dry with a CLEAN TOWEL.  10. Wear CLEAN PAJAMAS to bed  the night before surgery, wear comfortable clothes the morning of surgery  11. Place CLEAN SHEETS on your bed the night of your first shower and DO NOT SLEEP WITH PETS.    Day of Surgery:  Please shower the morning of surgery with the CHG soap Do not apply any deodorants/lotions. Please wear clean clothes to the hospital/surgery center.   Remember to brush your teeth WITH YOUR REGULAR TOOTHPASTE.   Please read over the following fact sheets that you were given.

## 2019-10-19 ENCOUNTER — Encounter (HOSPITAL_COMMUNITY)
Admission: RE | Admit: 2019-10-19 | Discharge: 2019-10-19 | Disposition: A | Discharge status: Home / Self Care | Payer: BC Managed Care – PPO | Source: Ambulatory Visit | Attending: Otolaryngology | Admitting: Otolaryngology

## 2019-10-19 ENCOUNTER — Other Ambulatory Visit (HOSPITAL_COMMUNITY): Payer: BC Managed Care – PPO

## 2019-10-19 ENCOUNTER — Encounter (HOSPITAL_COMMUNITY): Payer: Self-pay

## 2019-10-19 ENCOUNTER — Other Ambulatory Visit: Payer: Self-pay

## 2019-10-19 DIAGNOSIS — Z01812 Encounter for preprocedural laboratory examination: Secondary | ICD-10-CM | POA: Diagnosis not present

## 2019-10-19 HISTORY — DX: Other specified health status: Z78.9

## 2019-10-19 LAB — CBC
HCT: 45 % (ref 39.0–52.0)
Hemoglobin: 14.5 g/dL (ref 13.0–17.0)
MCH: 27.8 pg (ref 26.0–34.0)
MCHC: 32.2 g/dL (ref 30.0–36.0)
MCV: 86.2 fL (ref 80.0–100.0)
Platelets: 254 10*3/uL (ref 150–400)
RBC: 5.22 MIL/uL (ref 4.22–5.81)
RDW: 13.2 % (ref 11.5–15.5)
WBC: 3.6 10*3/uL — ABNORMAL LOW (ref 4.0–10.5)
nRBC: 0 % (ref 0.0–0.2)

## 2019-10-19 LAB — BASIC METABOLIC PANEL
Anion gap: 6 (ref 5–15)
BUN: 13 mg/dL (ref 6–20)
CO2: 27 mmol/L (ref 22–32)
Calcium: 9.4 mg/dL (ref 8.9–10.3)
Chloride: 107 mmol/L (ref 98–111)
Creatinine, Ser: 1.1 mg/dL (ref 0.61–1.24)
GFR calc Af Amer: >60 mL/min (ref >60)
GFR calc non Af Amer: >60 mL/min (ref >60)
Glucose, Bld: 109 mg/dL — ABNORMAL HIGH (ref 70–99)
Potassium: 4.2 mmol/L (ref 3.5–5.1)
Sodium: 140 mmol/L (ref 135–145)

## 2019-10-19 NOTE — Progress Notes (Signed)
PCP:  Dr. Burnett Kanaris Cardiologist:  Denies  EKG:  N/A CXR:  N/A ECHO:  Denies Stress Test:  Denies Cardiac Cath:  Denies  covid testing 10/20/19  Patient denies shortness of breath, fever, cough, and chest pain at PAT appointment.  Patient verbalized understanding of instructions provided today at the PAT appointment.  Patient asked to review instructions at home and day of surgery.

## 2019-10-19 NOTE — Progress Notes (Signed)
Per Fayrene Fearing, [place] labs to be drawn are CBC and BMP.

## 2019-10-20 ENCOUNTER — Other Ambulatory Visit (HOSPITAL_COMMUNITY)
Admission: RE | Admit: 2019-10-20 | Discharge: 2019-10-20 | Disposition: A | Discharge status: Home / Self Care | Payer: BC Managed Care – PPO | Source: Ambulatory Visit | Attending: Otolaryngology | Admitting: Otolaryngology

## 2019-10-20 DIAGNOSIS — Z01812 Encounter for preprocedural laboratory examination: Secondary | ICD-10-CM | POA: Diagnosis not present

## 2019-10-20 DIAGNOSIS — Z20822 Contact with and (suspected) exposure to covid-19: Secondary | ICD-10-CM | POA: Insufficient documentation

## 2019-10-20 LAB — SARS CORONAVIRUS 2 (TAT 6-24 HRS): SARS Coronavirus 2: NEGATIVE

## 2019-10-23 ENCOUNTER — Encounter (HOSPITAL_COMMUNITY)
Admission: RE | Disposition: A | Discharge status: Home / Self Care | Payer: Self-pay | Source: Home / Self Care | Attending: Otolaryngology

## 2019-10-23 ENCOUNTER — Inpatient Hospital Stay (HOSPITAL_COMMUNITY)
Admission: RE | Admit: 2019-10-23 | Discharge: 2019-10-24 | DRG: 145 | Disposition: A | Discharge status: Home / Self Care | Payer: BC Managed Care – PPO | Attending: Otolaryngology | Admitting: Otolaryngology

## 2019-10-23 ENCOUNTER — Inpatient Hospital Stay (HOSPITAL_COMMUNITY): Payer: BC Managed Care – PPO | Admitting: Vascular Surgery

## 2019-10-23 ENCOUNTER — Encounter (HOSPITAL_COMMUNITY): Payer: Self-pay | Admitting: Otolaryngology

## 2019-10-23 ENCOUNTER — Inpatient Hospital Stay (HOSPITAL_COMMUNITY): Payer: BC Managed Care – PPO

## 2019-10-23 ENCOUNTER — Other Ambulatory Visit: Payer: Self-pay

## 2019-10-23 DIAGNOSIS — Z791 Long term (current) use of non-steroidal anti-inflammatories (NSAID): Secondary | ICD-10-CM

## 2019-10-23 DIAGNOSIS — F17200 Nicotine dependence, unspecified, uncomplicated: Secondary | ICD-10-CM | POA: Diagnosis present

## 2019-10-23 DIAGNOSIS — K149 Disease of tongue, unspecified: Secondary | ICD-10-CM | POA: Diagnosis present

## 2019-10-23 DIAGNOSIS — M48062 Spinal stenosis, lumbar region with neurogenic claudication: Secondary | ICD-10-CM | POA: Diagnosis present

## 2019-10-23 DIAGNOSIS — Z79899 Other long term (current) drug therapy: Secondary | ICD-10-CM

## 2019-10-23 DIAGNOSIS — K148 Other diseases of tongue: Secondary | ICD-10-CM | POA: Diagnosis present

## 2019-10-23 HISTORY — PX: EXCISION OF TONGUE LESION: SHX6434

## 2019-10-23 SURGERY — EXCISION, LESION, TONGUE
Anesthesia: General | Site: Mouth

## 2019-10-23 MED ORDER — CLINDAMYCIN PHOSPHATE 900 MG/50ML IV SOLN
900.0000 mg | Freq: Once | INTRAVENOUS | Status: AC
  Administered 2019-10-23: 08:00:00 900 mg via INTRAVENOUS

## 2019-10-23 MED ORDER — 0.9 % SODIUM CHLORIDE (POUR BTL) OPTIME
TOPICAL | Status: DC | PRN
  Administered 2019-10-23: 1000 mL

## 2019-10-23 MED ORDER — MIDAZOLAM HCL 2 MG/2ML IJ SOLN
INTRAMUSCULAR | Status: AC
  Filled 2019-10-23: qty 2

## 2019-10-23 MED ORDER — SCOPOLAMINE 1 MG/3DAYS TD PT72
MEDICATED_PATCH | TRANSDERMAL | Status: AC
  Filled 2019-10-23: qty 1

## 2019-10-23 MED ORDER — LIDOCAINE-EPINEPHRINE 1 %-1:100000 IJ SOLN
INTRAMUSCULAR | Status: AC
  Filled 2019-10-23: qty 1

## 2019-10-23 MED ORDER — MORPHINE SULFATE (PF) 2 MG/ML IV SOLN
2.0000 mg | INTRAVENOUS | Status: DC | PRN
  Administered 2019-10-23 – 2019-10-24 (×5): 2 mg via INTRAVENOUS
  Filled 2019-10-23 (×4): qty 1

## 2019-10-23 MED ORDER — DEXAMETHASONE SODIUM PHOSPHATE 10 MG/ML IJ SOLN
INTRAMUSCULAR | Status: AC
  Filled 2019-10-23: qty 1

## 2019-10-23 MED ORDER — OXYMETAZOLINE HCL 0.05 % NA SOLN
NASAL | Status: AC
  Filled 2019-10-23: qty 30

## 2019-10-23 MED ORDER — ROCURONIUM BROMIDE 10 MG/ML (PF) SYRINGE
PREFILLED_SYRINGE | INTRAVENOUS | Status: AC
  Filled 2019-10-23: qty 10

## 2019-10-23 MED ORDER — LACTATED RINGERS IV SOLN: INTRAVENOUS | Status: DC | PRN

## 2019-10-23 MED ORDER — SUCCINYLCHOLINE CHLORIDE 200 MG/10ML IV SOSY
PREFILLED_SYRINGE | INTRAVENOUS | Status: AC
  Filled 2019-10-23: qty 10

## 2019-10-23 MED ORDER — KETOROLAC TROMETHAMINE 30 MG/ML IJ SOLN
INTRAMUSCULAR | Status: DC | PRN
  Administered 2019-10-23: 30 mg via INTRAVENOUS

## 2019-10-23 MED ORDER — LIDOCAINE-EPINEPHRINE 1 %-1:100000 IJ SOLN
INTRAMUSCULAR | Status: DC | PRN
  Administered 2019-10-23: 20 mL

## 2019-10-23 MED ORDER — FENTANYL CITRATE (PF) 250 MCG/5ML IJ SOLN
INTRAMUSCULAR | Status: AC
  Filled 2019-10-23: qty 5

## 2019-10-23 MED ORDER — BACITRACIN ZINC 500 UNIT/GM EX OINT
TOPICAL_OINTMENT | CUTANEOUS | Status: AC
  Filled 2019-10-23: qty 28.35

## 2019-10-23 MED ORDER — MELOXICAM 7.5 MG PO TABS
7.5000 mg | ORAL_TABLET | Freq: Every day | ORAL | Status: DC
  Administered 2019-10-23 – 2019-10-24 (×2): 7.5 mg via ORAL
  Filled 2019-10-23 (×2): qty 1

## 2019-10-23 MED ORDER — ROCURONIUM BROMIDE 10 MG/ML (PF) SYRINGE
PREFILLED_SYRINGE | INTRAVENOUS | Status: DC | PRN
  Administered 2019-10-23: 30 mg via INTRAVENOUS
  Administered 2019-10-23: 70 mg via INTRAVENOUS

## 2019-10-23 MED ORDER — MORPHINE SULFATE (PF) 2 MG/ML IV SOLN
INTRAVENOUS | Status: AC
  Filled 2019-10-23: qty 1

## 2019-10-23 MED ORDER — CLINDAMYCIN HCL 300 MG PO CAPS
300.0000 mg | ORAL_CAPSULE | Freq: Three times a day (TID) | ORAL | Status: DC
  Administered 2019-10-23 – 2019-10-24 (×4): 300 mg via ORAL
  Filled 2019-10-23 (×4): qty 1

## 2019-10-23 MED ORDER — PROMETHAZINE HCL 25 MG PO TABS
25.0000 mg | ORAL_TABLET | Freq: Four times a day (QID) | ORAL | Status: DC | PRN
  Administered 2019-10-23: 10:00:00 25 mg via ORAL
  Filled 2019-10-23: qty 1

## 2019-10-23 MED ORDER — FENTANYL CITRATE (PF) 100 MCG/2ML IJ SOLN
INTRAMUSCULAR | Status: AC
  Filled 2019-10-23: qty 2

## 2019-10-23 MED ORDER — PHENYLEPHRINE 40 MCG/ML (10ML) SYRINGE FOR IV PUSH (FOR BLOOD PRESSURE SUPPORT)
PREFILLED_SYRINGE | INTRAVENOUS | Status: AC
  Filled 2019-10-23: qty 10

## 2019-10-23 MED ORDER — SUGAMMADEX SODIUM 500 MG/5ML IV SOLN
INTRAVENOUS | Status: AC
  Filled 2019-10-23: qty 5

## 2019-10-23 MED ORDER — PHENOL 1.4 % MT LIQD
1.0000 | OROMUCOSAL | Status: DC | PRN
  Administered 2019-10-24: 1 via OROMUCOSAL
  Filled 2019-10-23: qty 177

## 2019-10-23 MED ORDER — ONDANSETRON HCL 4 MG/2ML IJ SOLN: 4.0000 mg | Freq: Once | INTRAMUSCULAR | Status: DC | PRN

## 2019-10-23 MED ORDER — OXYCODONE HCL 5 MG/5ML PO SOLN: 5.0000 mg | Freq: Once | ORAL | Status: DC | PRN

## 2019-10-23 MED ORDER — KETOROLAC TROMETHAMINE 30 MG/ML IJ SOLN
INTRAMUSCULAR | Status: AC
  Filled 2019-10-23: qty 1

## 2019-10-23 MED ORDER — DEXAMETHASONE SODIUM PHOSPHATE 10 MG/ML IJ SOLN
INTRAMUSCULAR | Status: DC | PRN
  Administered 2019-10-23: 10 mg via INTRAVENOUS

## 2019-10-23 MED ORDER — FENTANYL CITRATE (PF) 100 MCG/2ML IJ SOLN
25.0000 ug | INTRAMUSCULAR | Status: DC | PRN
  Administered 2019-10-23 (×2): 50 ug via INTRAVENOUS

## 2019-10-23 MED ORDER — ONDANSETRON HCL 4 MG/2ML IJ SOLN
INTRAMUSCULAR | Status: DC | PRN
  Administered 2019-10-23: 4 mg via INTRAVENOUS

## 2019-10-23 MED ORDER — FENTANYL CITRATE (PF) 250 MCG/5ML IJ SOLN
INTRAMUSCULAR | Status: DC | PRN
  Administered 2019-10-23 (×2): 25 ug via INTRAVENOUS
  Administered 2019-10-23 (×4): 50 ug via INTRAVENOUS

## 2019-10-23 MED ORDER — OXYMETAZOLINE HCL 0.05 % NA SOLN
2.0000 | NASAL | Status: DC
  Administered 2019-10-23 (×2): 2 via NASAL

## 2019-10-23 MED ORDER — GLYCOPYRROLATE PF 0.2 MG/ML IJ SOSY
PREFILLED_SYRINGE | INTRAMUSCULAR | Status: AC
  Filled 2019-10-23: qty 1

## 2019-10-23 MED ORDER — PROPOFOL 10 MG/ML IV BOLUS
INTRAVENOUS | Status: AC
  Filled 2019-10-23: qty 40

## 2019-10-23 MED ORDER — EPHEDRINE 5 MG/ML INJ
INTRAVENOUS | Status: AC
  Filled 2019-10-23: qty 10

## 2019-10-23 MED ORDER — OXYCODONE HCL 5 MG PO TABS: 5.0000 mg | ORAL_TABLET | Freq: Once | ORAL | Status: DC | PRN

## 2019-10-23 MED ORDER — MIDAZOLAM HCL 5 MG/5ML IJ SOLN
INTRAMUSCULAR | Status: DC | PRN
  Administered 2019-10-23: 2 mg via INTRAVENOUS

## 2019-10-23 MED ORDER — CLINDAMYCIN PHOSPHATE 900 MG/50ML IV SOLN
INTRAVENOUS | Status: AC
  Filled 2019-10-23: qty 50

## 2019-10-23 MED ORDER — DEXTROSE-NACL 5-0.9 % IV SOLN: INTRAVENOUS | Status: DC

## 2019-10-23 MED ORDER — SUGAMMADEX SODIUM 500 MG/5ML IV SOLN
INTRAVENOUS | Status: DC | PRN
  Administered 2019-10-23: 300 mg via INTRAVENOUS

## 2019-10-23 MED ORDER — PROPOFOL 10 MG/ML IV BOLUS
INTRAVENOUS | Status: DC | PRN
  Administered 2019-10-23: 100 mg via INTRAVENOUS
  Administered 2019-10-23: 200 mg via INTRAVENOUS

## 2019-10-23 MED ORDER — DEXMEDETOMIDINE HCL IN NACL 200 MCG/50ML IV SOLN
INTRAVENOUS | Status: AC
  Filled 2019-10-23: qty 50

## 2019-10-23 MED ORDER — ONDANSETRON HCL 4 MG/2ML IJ SOLN
INTRAMUSCULAR | Status: AC
  Filled 2019-10-23: qty 2

## 2019-10-23 MED ORDER — IBUPROFEN 200 MG PO TABS
400.0000 mg | ORAL_TABLET | Freq: Three times a day (TID) | ORAL | Status: DC | PRN
  Administered 2019-10-23: 600 mg via ORAL
  Filled 2019-10-23: qty 3

## 2019-10-23 MED ORDER — ESMOLOL HCL 100 MG/10ML IV SOLN
INTRAVENOUS | Status: AC
  Filled 2019-10-23: qty 10

## 2019-10-23 MED ORDER — HYDROCODONE-ACETAMINOPHEN 7.5-325 MG/15ML PO SOLN
10.0000 mL | ORAL | Status: DC | PRN
  Administered 2019-10-23 – 2019-10-24 (×4): 15 mL via ORAL
  Filled 2019-10-23 (×4): qty 15

## 2019-10-23 MED ORDER — GABAPENTIN 300 MG PO CAPS
300.0000 mg | ORAL_CAPSULE | Freq: Two times a day (BID) | ORAL | Status: DC
  Administered 2019-10-23 – 2019-10-24 (×3): 300 mg via ORAL
  Filled 2019-10-23 (×3): qty 1

## 2019-10-23 MED ORDER — DEXMEDETOMIDINE HCL 200 MCG/2ML IV SOLN
INTRAVENOUS | Status: DC | PRN
  Administered 2019-10-23: 8 ug via INTRAVENOUS

## 2019-10-23 MED ORDER — LIDOCAINE 2% (20 MG/ML) 5 ML SYRINGE
INTRAMUSCULAR | Status: DC | PRN
  Administered 2019-10-23: 40 mg via INTRAVENOUS

## 2019-10-23 MED ORDER — PROMETHAZINE HCL 25 MG RE SUPP
25.0000 mg | Freq: Four times a day (QID) | RECTAL | Status: DC | PRN
  Filled 2019-10-23: qty 1

## 2019-10-23 SURGICAL SUPPLY — 52 items
APPLIER CLIP 9.375 MED OPEN (MISCELLANEOUS)
APR CLP MED 9.3 20 MLT OPN (MISCELLANEOUS)
ATTRACTOMAT 16X20 MAGNETIC DRP (DRAPES) IMPLANT
BLADE SURG 15 STRL LF DISP TIS (BLADE) IMPLANT
BLADE SURG 15 STRL SS (BLADE)
CANISTER SUCT 3000ML PPV (MISCELLANEOUS) ×3 IMPLANT
CLEANER TIP ELECTROSURG 2X2 (MISCELLANEOUS) ×3 IMPLANT
CLIP APPLIE 9.375 MED OPEN (MISCELLANEOUS) IMPLANT
CNTNR URN SCR LID CUP LEK RST (MISCELLANEOUS) ×1 IMPLANT
CONT SPEC 4OZ STRL OR WHT (MISCELLANEOUS) ×3
CORD BIPOLAR FORCEPS 12FT (ELECTRODE) ×2 IMPLANT
COVER SURGICAL LIGHT HANDLE (MISCELLANEOUS) ×3 IMPLANT
COVER WAND RF STERILE (DRAPES) ×3 IMPLANT
DRAPE HALF SHEET 40X57 (DRAPES) ×6 IMPLANT
ELECT COATED BLADE 2.86 ST (ELECTRODE) ×3 IMPLANT
ELECT REM PT RETURN 9FT ADLT (ELECTROSURGICAL) ×3
ELECTRODE REM PT RTRN 9FT ADLT (ELECTROSURGICAL) ×1 IMPLANT
FORCEPS BIPOLAR SPETZLER 8 1.0 (NEUROSURGERY SUPPLIES) ×2 IMPLANT
GAUZE 4X4 16PLY RFD (DISPOSABLE) ×3 IMPLANT
GAUZE SPONGE 4X4 12PLY STRL (GAUZE/BANDAGES/DRESSINGS) IMPLANT
GLOVE ECLIPSE 7.5 STRL STRAW (GLOVE) ×3 IMPLANT
GOWN STRL REUS W/ TWL LRG LVL3 (GOWN DISPOSABLE) ×2 IMPLANT
GOWN STRL REUS W/TWL LRG LVL3 (GOWN DISPOSABLE) ×6
KIT BASIN OR (CUSTOM PROCEDURE TRAY) ×3 IMPLANT
KIT TURNOVER KIT B (KITS) ×3 IMPLANT
LOCATOR NERVE 3 VOLT (DISPOSABLE) IMPLANT
NDL PRECISIONGLIDE 27X1.5 (NEEDLE) ×1 IMPLANT
NEEDLE PRECISIONGLIDE 27X1.5 (NEEDLE) ×3 IMPLANT
NS IRRIG 1000ML POUR BTL (IV SOLUTION) ×3 IMPLANT
PAD ARMBOARD 7.5X6 YLW CONV (MISCELLANEOUS) ×3 IMPLANT
PENCIL FOOT CONTROL (ELECTRODE) ×3 IMPLANT
POSITIONER HEAD DONUT 9IN (MISCELLANEOUS) ×3 IMPLANT
SPONGE INTESTINAL PEANUT (DISPOSABLE) IMPLANT
SPONGE LAP 18X18 RF (DISPOSABLE) ×3 IMPLANT
SURGILUBE 2OZ TUBE FLIPTOP (MISCELLANEOUS) IMPLANT
SUT CHROMIC 3 0 PS 2 (SUTURE) ×9 IMPLANT
SUT SILK 0 FSL (SUTURE) ×3 IMPLANT
SUT SILK 2 0 PERMA HAND 18 BK (SUTURE) IMPLANT
SUT SILK 3 0 REEL (SUTURE) IMPLANT
SUT SILK 3 0 SH CR/8 (SUTURE) ×3 IMPLANT
SUT SILK 4 0 REEL (SUTURE) ×3 IMPLANT
SUT VIC AB 3-0 SH 18 (SUTURE) ×4 IMPLANT
SUT VIC AB 3-0 SH 27 (SUTURE) ×12
SUT VIC AB 3-0 SH 27XBRD (SUTURE) ×2 IMPLANT
SUT VIC AB 4-0 RB1 18 (SUTURE) IMPLANT
TOWEL GREEN STERILE FF (TOWEL DISPOSABLE) ×3 IMPLANT
TRAY ENT MC OR (CUSTOM PROCEDURE TRAY) ×3 IMPLANT
TRAY FOLEY MTR SLVR 14FR STAT (SET/KITS/TRAYS/PACK) IMPLANT
TUBE CONNECTING 12'X1/4 (SUCTIONS) ×1
TUBE CONNECTING 12X1/4 (SUCTIONS) ×2 IMPLANT
UNDERPAD 30X30 (UNDERPADS AND DIAPERS) ×3 IMPLANT
WATER STERILE IRR 1000ML POUR (IV SOLUTION) ×3 IMPLANT

## 2019-10-23 NOTE — Anesthesia Procedure Notes (Signed)
Procedure Name: Intubation Date/Time: 10/23/2019 7:50 AM Performed by: Harden Mo, CRNA Pre-anesthesia Checklist: Patient identified, Emergency Drugs available, Suction available, Patient being monitored and Timeout performed Patient Re-evaluated:Patient Re-evaluated prior to induction Oxygen Delivery Method: Circle system utilized Preoxygenation: Pre-oxygenation with 100% oxygen Induction Type: IV induction Ventilation: Mask ventilation without difficulty Laryngoscope Size: Mac and 4 Grade View: Grade I Nasal Tubes: Left, Magill forceps- large, utilized and Nasal prep performed Tube size: 7.5 mm Number of attempts: 2 (attempt by R nare x 1; L nare for final placement) Placement Confirmation: ETT inserted through vocal cords under direct vision,  positive ETCO2,  CO2 detector and breath sounds checked- equal and bilateral Dental Injury: Teeth and Oropharynx as per pre-operative assessment  Comments: Dilated nare; premed with afrin; inserted through L nostril

## 2019-10-23 NOTE — Anesthesia Preprocedure Evaluation (Signed)
Anesthesia Evaluation  Patient identified by MRN, date of birth, ID band Patient awake    Reviewed: Allergy & Precautions, NPO status , Patient's Chart, lab work & pertinent test results  Airway Mallampati: II  TM Distance: >3 FB Neck ROM: Full    Dental  (+) Teeth Intact, Dental Advisory Given   Pulmonary Current Smoker and Patient abstained from smoking.,    breath sounds clear to auscultation       Cardiovascular  Rhythm:Regular Rate:Normal     Neuro/Psych    GI/Hepatic   Endo/Other    Renal/GU      Musculoskeletal   Abdominal   Peds  Hematology   Anesthesia Other Findings   Reproductive/Obstetrics                             Anesthesia Physical Anesthesia Plan  ASA: II  Anesthesia Plan: General   Post-op Pain Management:    Induction: Intravenous  PONV Risk Score and Plan: Ondansetron and Dexamethasone  Airway Management Planned: Oral ETT  Additional Equipment:   Intra-op Plan:   Post-operative Plan: Extubation in OR  Informed Consent: I have reviewed the patients History and Physical, chart, labs and discussed the procedure including the risks, benefits and alternatives for the proposed anesthesia with the patient or authorized representative who has indicated his/her understanding and acceptance.     Dental advisory given  Plan Discussed with: Anesthesiologist and CRNA  Anesthesia Plan Comments:         Anesthesia Quick Evaluation

## 2019-10-23 NOTE — Op Note (Signed)
OPERATIVE REPORT  DATE OF SURGERY: 10/23/2019  PATIENT:  Alexander Roberts,  34 y.o. male  PRE-OPERATIVE DIAGNOSIS:  oropharyngeal mass  POST-OPERATIVE DIAGNOSIS:  oropharyngeal mass  PROCEDURE:  Procedure(s): RESECTION OF THYROGLOSSAL REMNANT IN BASE OF TONGUE  SURGEON:  Susy Frizzle, MD  ASSISTANTS: Dr. Doran Heater  ANESTHESIA:   General   EBL: 50 ml  DRAINS: None  LOCAL MEDICATIONS USED:  None  SPECIMEN: Midline tongue base mass  COUNTS:  Correct  PROCEDURE DETAILS: The patient was taken to the operating room and placed on the operating table in the supine position. Following induction of nasotracheal anesthesia, the face was draped in a standard fashion.  The tip of the tongue was grasped with a large towel clip and protruded to facilitate exposure of the tongue base.  Palpation revealed a subtle fullness of the depths of the tongue base.  Midline incision was created using electrocautery through the posterior oral tongue and tongue base.  An 18-gauge needle was used to try to localize the cystic mass.  The mass was ultimately identified just to the right of midline.  Thick cystic fluid was found in the center.  The mass was removed in its entirety keeping the thickened capsule intact.  The surrounding musculature was all in good shape.  There does not appear to be a stalk connecting deep down towards the hyoid bone.  The entire lesion was removed and sent for pathologic valuation.  It was dissected on the back table to inspect and the entire cyst appeared to be completely removed based on that.  Electrocautery and bipolar cautery used for completion of hemostasis of the surgical bed.  The wound was closed in layers using interrupted inverted 3-0 Vicryl sutures in the deeper layers and a running Vicryl along the surface.  There is no further bleeding.  There is minimal swelling.  The oral cavity and pharynx were irrigated with saline and suction.  An orogastric tube was used to  aspirate contents of the stomach.  Patient was awakened extubated and transferred to recovery in stable condition.    PATIENT DISPOSITION:  To PACU, stable

## 2019-10-23 NOTE — Progress Notes (Signed)
   ENT Progress Note: s/p Procedure(s): RESECTION OF THYROGLOSSAL REMNANT IN BASE OF TONGUE   Subjective: tol po fluids  Objective: Vital signs in last 24 hours: Temp:  [97.3 F (36.3 C)-98.5 F (36.9 C)] 97.8 F (36.6 C) (02/22 1008) Pulse Rate:  [67-74] 70 (02/22 1008) Resp:  [14-18] 18 (02/22 1008) BP: (142-167)/(88-102) 144/88 (02/22 1008) SpO2:  [96 %-99 %] 99 % (02/22 1008) Weight:  [145.6 kg] 145.6 kg (02/22 0601) Weight change:     Intake/Output from previous day: No intake/output data recorded. Intake/Output this shift: Total I/O In: 1856.8 [P.O.:710; I.V.:1146.8] Out: 25 [Blood:25]  Labs: No results for input(s): WBC, HGB, HCT, PLT in the last 72 hours. No results for input(s): NA, K, CL, CO2, GLUCOSE, BUN, CALCIUM in the last 72 hours.  Invalid input(s): CREATININR  Studies/Results: No results found.   PHYSICAL EXAM: No swelling or bleeding Airway stable   Assessment/Plan: Pt tol po fluids Pain controlled  Monitor O/N    Osborn Coho 10/23/2019, 4:52 PM

## 2019-10-23 NOTE — Transfer of Care (Signed)
Immediate Anesthesia Transfer of Care Note  Patient: Alexander Roberts  Procedure(s) Performed: RESECTION OF THYROGLOSSAL REMNANT IN BASE OF TONGUE (N/A Mouth)  Patient Location: PACU  Anesthesia Type:General  Level of Consciousness: awake, alert  and oriented  Airway & Oxygen Therapy: Patient Spontanous Breathing  Post-op Assessment: Report given to RN, Post -op Vital signs reviewed and stable and Patient moving all extremities X 4  Post vital signs: Reviewed and stable  Last Vitals:  Vitals Value Taken Time  BP 142/92 10/23/19 0903  Temp    Pulse 74 10/23/19 0904  Resp 12 10/23/19 0904  SpO2 99 % 10/23/19 0904  Vitals shown include unvalidated device data.  Last Pain:  Vitals:   10/23/19 0601  TempSrc: Oral  PainSc: 0-No pain         Complications: No apparent anesthesia complications

## 2019-10-23 NOTE — Interval H&P Note (Signed)
History and Physical Interval Note:  10/23/2019 7:18 AM  Alexander Roberts  has presented today for surgery, with the diagnosis of oropharyngeal mass.  The various methods of treatment have been discussed with the patient and family. After consideration of risks, benefits and other options for treatment, the patient has consented to  Procedure(s): RESECTION OF THYROGLOSSAL REMNANT IN BASE OF TONGUE (N/A) as a surgical intervention.  The patient's history has been reviewed, patient examined, no change in status, stable for surgery.  I have reviewed the patient's chart and labs.  Questions were answered to the patient's satisfaction.     Serena Colonel

## 2019-10-23 NOTE — Anesthesia Postprocedure Evaluation (Signed)
Anesthesia Post Note  Patient: Alexander Roberts  Procedure(s) Performed: RESECTION OF THYROGLOSSAL REMNANT IN BASE OF TONGUE (N/A Mouth)     Patient location during evaluation: PACU Anesthesia Type: General Level of consciousness: awake and alert Pain management: pain level controlled Vital Signs Assessment: post-procedure vital signs reviewed and stable Respiratory status: spontaneous breathing, nonlabored ventilation, respiratory function stable and patient connected to nasal cannula oxygen Cardiovascular status: blood pressure returned to baseline and stable Postop Assessment: no apparent nausea or vomiting Anesthetic complications: no    Last Vitals:  Vitals:   10/23/19 1008 10/23/19 1705  BP: (!) 144/88 139/78  Pulse: 70 90  Resp: 18 18  Temp: 36.6 C 36.5 C  SpO2: 99% 100%    Last Pain:  Vitals:   10/23/19 1722  TempSrc:   PainSc: 8                  Torell Minder COKER

## 2019-10-24 LAB — SURGICAL PATHOLOGY

## 2019-10-24 MED ORDER — PROMETHAZINE HCL 25 MG RE SUPP: 25.0000 mg | Freq: Four times a day (QID) | RECTAL | 1 refills | Status: DC | PRN

## 2019-10-24 MED ORDER — CLINDAMYCIN HCL 300 MG PO CAPS: 300.0000 mg | ORAL_CAPSULE | Freq: Three times a day (TID) | ORAL | 0 refills | Status: DC

## 2019-10-24 MED ORDER — HYDROCODONE-ACETAMINOPHEN 7.5-325 MG/15ML PO SOLN: 15.0000 mL | Freq: Four times a day (QID) | ORAL | 0 refills | Status: DC | PRN

## 2019-10-24 NOTE — Plan of Care (Signed)

## 2019-10-24 NOTE — Discharge Summary (Signed)
Physician Discharge Summary  Patient ID: Alexander Roberts MRN: [id number] DOB/AGE: 34-Mar-1987 34 y.o.  Admit date: 10/23/2019 Discharge date: 10/24/2019  Admission Diagnoses: Tongue base cyst  Discharge Diagnoses:  Active Problems:   Tongue mass   Discharged Condition: good  Hospital Course: No complications  Consults: none  Significant Diagnostic Studies: none  Treatments: surgery: Resection of midline tongue base cyst  Discharge Exam: Blood pressure (!) 108/48, pulse (!) 46, temperature 98.2 F (36.8 C), temperature source Oral, resp. rate 18, height 6\' 3"  (1.905 m), weight (!) 145.6 kg, SpO2 97 %. PHYSICAL EXAM: Awake and alert.  Breathing and voice are clear and healthy.  Tongue mobility is normal.  Surgical site closure looks excellent.  Minimal swelling.  No bleeding.  Disposition: Discharge disposition: 01-Home or Self Care       Discharge Instructions    Diet - low sodium heart healthy   Complete by: As directed    Increase activity slowly   Complete by: As directed      Allergies as of 10/24/2019   No Known Allergies     Medication List    TAKE these medications   clindamycin 300 MG capsule Commonly known as: Cleocin Take 1 capsule (300 mg total) by mouth 3 (three) times daily.   gabapentin 300 MG capsule Commonly known as: NEURONTIN Take 300 mg by mouth 2 (two) times daily.   HYDROcodone-acetaminophen 7.5-325 mg/15 ml solution Commonly known as: HYCET Take 15 mLs by mouth 4 (four) times daily as needed for moderate pain.   ibuprofen 200 MG tablet Commonly known as: ADVIL Take 400-600 mg by mouth every 8 (eight) hours as needed (pain.).   meloxicam 7.5 MG tablet Commonly known as: MOBIC Take 7.5 mg by mouth daily.   multivitamin with minerals Tabs tablet Take 1 tablet by mouth daily. Centrum Multivitamin   promethazine 25 MG suppository Commonly known as: PHENERGAN Place 1 suppository (25 mg total) rectally every 6 (six) hours as needed  for nausea or vomiting.      Follow-up Information    12-29-1976, MD. Schedule an appointment as soon as possible for a visit in 1 week(s).   Specialty: Otolaryngology Contact information: [address] [place] [phone]           Signed: [phone] 10/24/2019, 9:02 AM

## 2019-10-24 NOTE — Plan of Care (Signed)
  Problem: Education: Goal: Knowledge of General Education information will improve Description: Including pain rating scale, medication(s)/side effects and non-pharmacologic comfort measures 10/24/2019 0954 by Ladona Horns, RN Outcome: Adequate for Discharge 10/24/2019 0940 by Ladona Horns, RN Outcome: Progressing   Problem: Health Behavior/Discharge Planning: Goal: Ability to manage health-related needs will improve 10/24/2019 0954 by Ladona Horns, RN Outcome: Adequate for Discharge 10/24/2019 0940 by Ladona Horns, RN Outcome: Progressing   Problem: Clinical Measurements: Goal: Ability to maintain clinical measurements within normal limits will improve 10/24/2019 0954 by Ladona Horns, RN Outcome: Adequate for Discharge 10/24/2019 0940 by Ladona Horns, RN Outcome: Progressing Goal: Will remain free from infection 10/24/2019 0954 by Ladona Horns, RN Outcome: Adequate for Discharge 10/24/2019 0940 by Ladona Horns, RN Outcome: Progressing Goal: Diagnostic test results will improve 10/24/2019 0954 by Ladona Horns, RN Outcome: Adequate for Discharge 10/24/2019 0940 by Ladona Horns, RN Outcome: Progressing Goal: Respiratory complications will improve 10/24/2019 0954 by Ladona Horns, RN Outcome: Adequate for Discharge 10/24/2019 0940 by Ladona Horns, RN Outcome: Progressing Goal: Cardiovascular complication will be avoided 10/24/2019 0954 by Ladona Horns, RN Outcome: Adequate for Discharge 10/24/2019 0940 by Ladona Horns, RN Outcome: Progressing   Problem: Activity: Goal: Risk for activity intolerance will decrease 10/24/2019 0954 by Ladona Horns, RN Outcome: Adequate for Discharge 10/24/2019 0940 by Ladona Horns, RN Outcome: Progressing   Problem: Nutrition: Goal: Adequate nutrition will be maintained 10/24/2019 0954 by Ladona Horns, RN Outcome: Adequate for Discharge 10/24/2019 0940 by Ladona Horns, RN Outcome:  Progressing   Problem: Coping: Goal: Level of anxiety will decrease 10/24/2019 0954 by Ladona Horns, RN Outcome: Adequate for Discharge 10/24/2019 0940 by Ladona Horns, RN Outcome: Progressing   Problem: Elimination: Goal: Will not experience complications related to bowel motility 10/24/2019 0954 by Ladona Horns, RN Outcome: Adequate for Discharge 10/24/2019 0940 by Ladona Horns, RN Outcome: Progressing Goal: Will not experience complications related to urinary retention 10/24/2019 0954 by Ladona Horns, RN Outcome: Adequate for Discharge 10/24/2019 0940 by Ladona Horns, RN Outcome: Progressing   Problem: Pain Managment: Goal: General experience of comfort will improve 10/24/2019 0954 by Ladona Horns, RN Outcome: Adequate for Discharge 10/24/2019 0940 by Ladona Horns, RN Outcome: Progressing   Problem: Safety: Goal: Ability to remain free from injury will improve 10/24/2019 0954 by Ladona Horns, RN Outcome: Adequate for Discharge 10/24/2019 0940 by Ladona Horns, RN Outcome: Progressing   Problem: Skin Integrity: Goal: Risk for impaired skin integrity will decrease 10/24/2019 0954 by Ladona Horns, RN Outcome: Adequate for Discharge 10/24/2019 0940 by Ladona Horns, RN Outcome: Progressing   Problem: Education: Goal: Required Educational Video(s) 10/24/2019 0954 by Ladona Horns, RN Outcome: Adequate for Discharge 10/24/2019 0940 by Ladona Horns, RN Outcome: Progressing   Problem: Clinical Measurements: Goal: Postoperative complications will be avoided or minimized 10/24/2019 0954 by Ladona Horns, RN Outcome: Adequate for Discharge 10/24/2019 0940 by Ladona Horns, RN Outcome: Progressing   Problem: Skin Integrity: Goal: Demonstration of wound healing without infection will improve 10/24/2019 0954 by Ladona Horns, RN Outcome: Adequate for Discharge 10/24/2019 0940 by Ladona Horns, RN Outcome: Progressing

## 2019-10-24 NOTE — Discharge Instructions (Signed)
Stay on full liquid diet, include soft foods as tolerated.  Advance diet as tolerated.  Make sure you are consuming adequate protein and calories.  Avoid any heavy lifting for 2 weeks.

## 2021-01-28 IMAGING — CT CT NECK W/ CM
5 of 6 series · 14 of 33 positions shown, 16 images · IV contrast (iopamidol)
Comparison: CT neck 06/23/2019

CLINICAL DATA: Thyroglossal duct infection post treatment

EXAM:
CT NECK WITH CONTRAST
TECHNIQUE: Multidetector CT imaging of the neck was performed using the
standard protocol following the bolus administration of intravenous
contrast.
CONTRAST:  75mL IZD88H-2FF IOPAMIDOL (IZD88H-2FF) INJECTION 61%

[Series 2: neck 2.00 br40 s3 st/ no angle · axial · 0.54mm/px · z∈[-742,-650]mm · 2 of 140 slices shown, 3 images]
[im 47/140  soft-tissue]
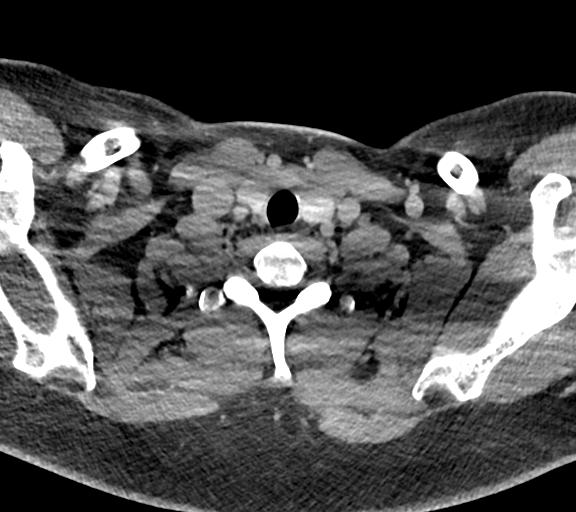
[im 47/140  bone]
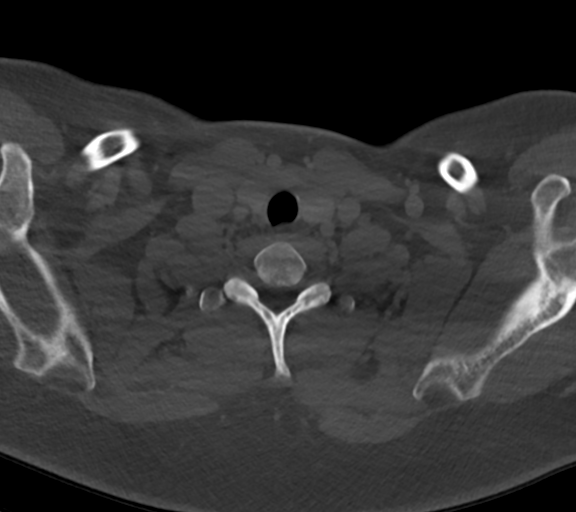
[im 93/140  bone]
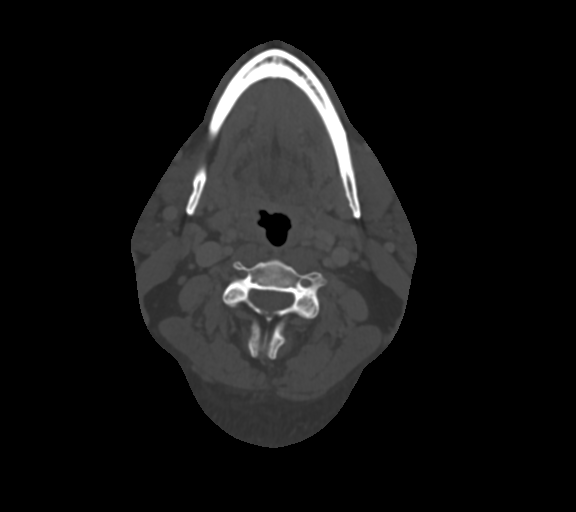

[Series 4: neck 2.00 br60 s3 bone/ no angle · axial · 0.54mm/px · z∈[-742,-650]mm · 2 of 140 slices shown]
[im 47/140  bone]
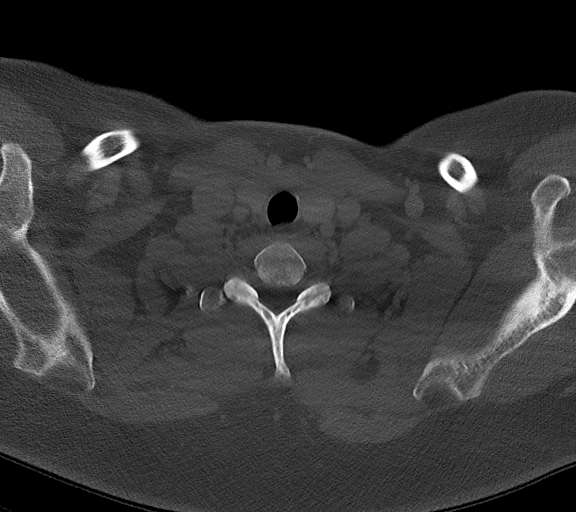
[im 93/140  bone]
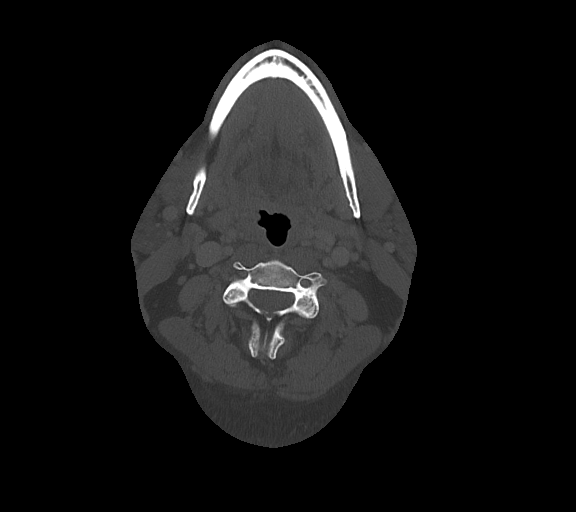

[Series 6: neck 2.00 br36 s3 angled axial (person_name) · axial · 0.54mm/px · z∈[-742,-650]mm · 2 of 140 slices shown]
[im 47/140  bone]
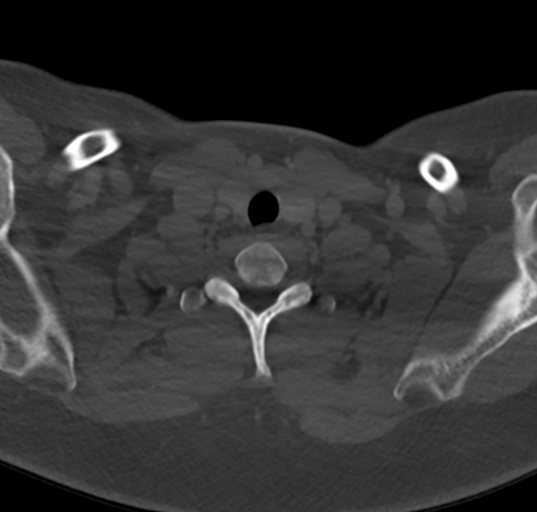
[im 93/140  bone]
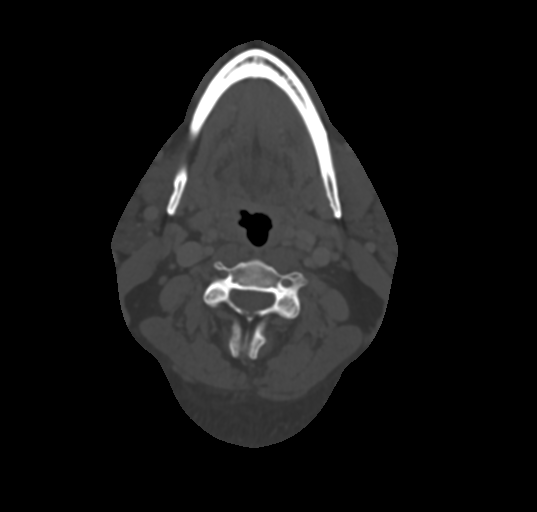

[Series 10: neck 2.00 br40 s3 (person_name) · coronal · 0.55mm/px · 3 of 138 slices shown (1 of 2)]
[im 28/138  bone]
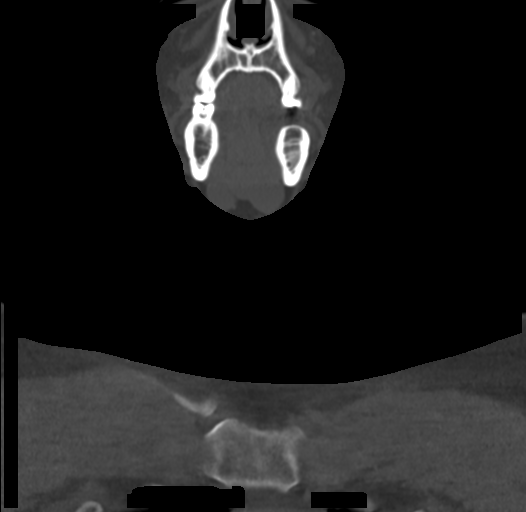
[im 55/138  bone]
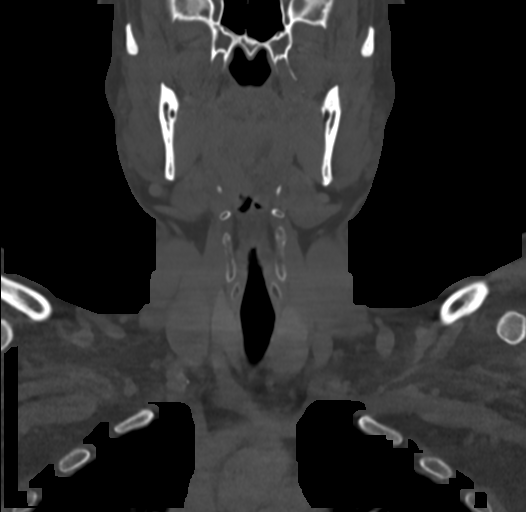
[im 83/138  bone]
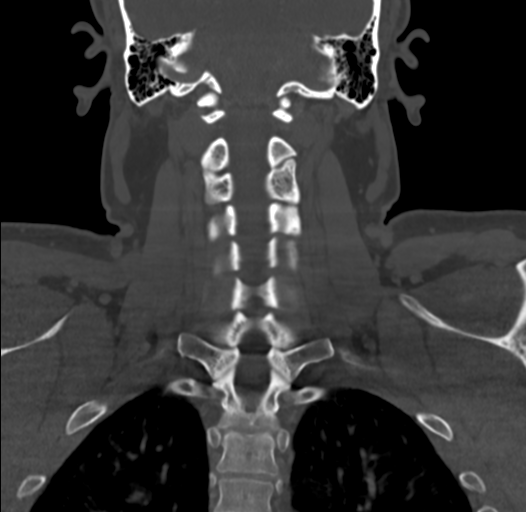

[Series 12: neck 2.00 br40 s3 (person_name) · sagittal · 0.54mm/px · 5 of 144 slices shown, 6 images (2 of 2)]
[im 48/144  bone]
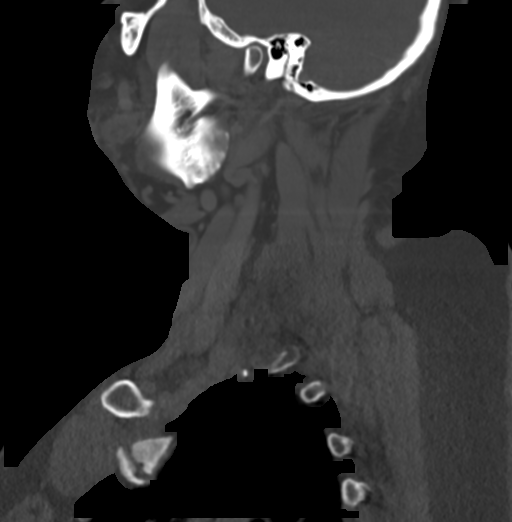
[im 60/144  bone]
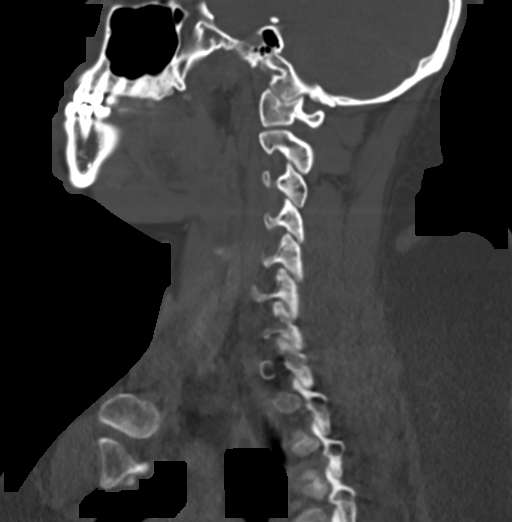
[im 72/144  soft-tissue]
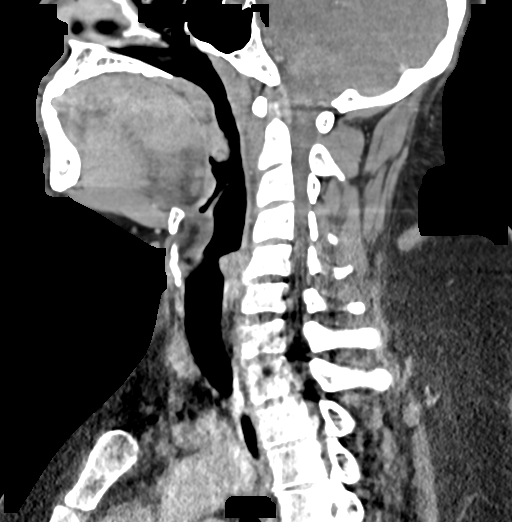
[im 72/144  bone]
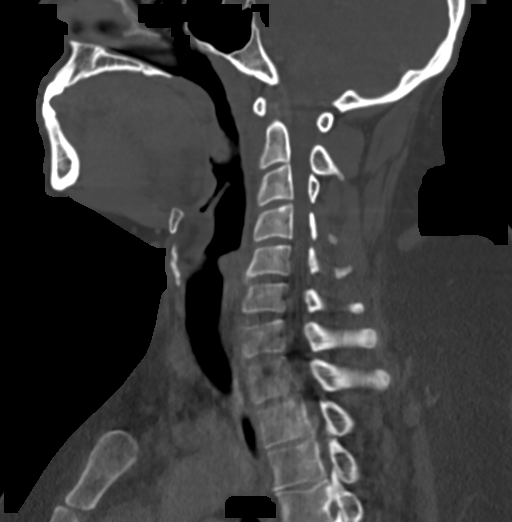
[im 84/144  bone]
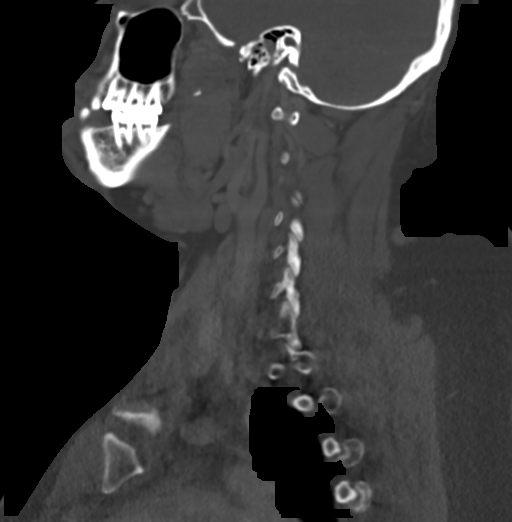
[im 96/144  bone]
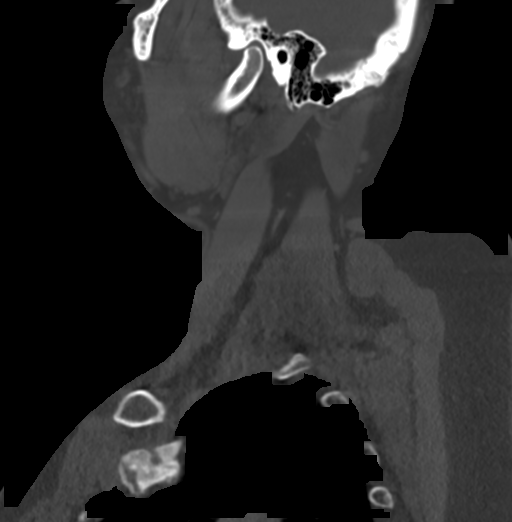

[14 of 33 positions shown; findings below may reference images not displayed]

FINDINGS: Pharynx and larynx: Interval improvement in tongue lesion. Rim
enhancing fluid collection in the central posterior tongue is
smaller. The fluid collection now measures approximately 15 x 9 x 12
mm. Previously the fluid collection measured 19 x 25 x 23 mm. There
is a thick-walled enhancing tissue surrounding the fluid collection.

No pharyngeal mass or airway compromise.  Larynx normal.

Salivary glands: No inflammation, mass, or stone.

Thyroid: Thyroid normal in volume and without focal lesion

Lymph nodes: No pathologic adenopathy. Right level 2 lymph node 9
mm. Left level 2 lymph node 11 mm. Scattered small level 3 lymph
nodes.

Vascular: Normal vascular enhancement.

Limited intracranial: Negative

Visualized orbits: Negative

Mastoids and visualized paranasal sinuses: Negative

Skeleton: Negative

Upper chest: Negative

Other: None
IMPRESSION: Interval improvement in fluid collection the central tongue compared
with 1 month ago. There is considerable surrounding soft tissue
enhancement around the fluid collection. Findings most compatible
with thyroglossal duct infection with resolving abscess. No airway
compromise. Bilateral lymph nodes are not pathologic and felt to be
reactive due to pharyngeal infection.

## 2024-09-15 ENCOUNTER — Emergency Department (HOSPITAL_BASED_OUTPATIENT_CLINIC_OR_DEPARTMENT_OTHER): Admitting: Radiology

## 2024-09-15 ENCOUNTER — Other Ambulatory Visit: Payer: Self-pay

## 2024-09-15 ENCOUNTER — Encounter (HOSPITAL_BASED_OUTPATIENT_CLINIC_OR_DEPARTMENT_OTHER): Payer: Self-pay

## 2024-09-15 ENCOUNTER — Emergency Department (HOSPITAL_BASED_OUTPATIENT_CLINIC_OR_DEPARTMENT_OTHER)
Admission: EM | Admit: 2024-09-15 | Discharge: 2024-09-15 | Disposition: A | Discharge status: Home / Self Care | Attending: Emergency Medicine | Admitting: Emergency Medicine

## 2024-09-15 DIAGNOSIS — Z79899 Other long term (current) drug therapy: Secondary | ICD-10-CM | POA: Insufficient documentation

## 2024-09-15 DIAGNOSIS — M545 Low back pain, unspecified: Secondary | ICD-10-CM | POA: Diagnosis present

## 2024-09-15 DIAGNOSIS — G8929 Other chronic pain: Secondary | ICD-10-CM | POA: Diagnosis not present

## 2024-09-15 MED ORDER — LIDOCAINE 5 % EX PTCH: 1.0000 | MEDICATED_PATCH | CUTANEOUS | 0 refills | Status: DC

## 2024-09-15 MED ORDER — CYCLOBENZAPRINE HCL 10 MG PO TABS: 10.0000 mg | ORAL_TABLET | Freq: Two times a day (BID) | ORAL | 0 refills | Status: DC | PRN

## 2024-09-15 MED ORDER — OXYCODONE HCL 5 MG PO TABS
10.0000 mg | ORAL_TABLET | Freq: Once | ORAL | Status: AC
  Administered 2024-09-15: 10 mg via ORAL
  Filled 2024-09-15: qty 2

## 2024-09-15 MED ORDER — CYCLOBENZAPRINE HCL 10 MG PO TABS
10.0000 mg | ORAL_TABLET | Freq: Once | ORAL | Status: AC
  Administered 2024-09-15: 10 mg via ORAL
  Filled 2024-09-15: qty 1

## 2024-09-15 MED ORDER — CELECOXIB 200 MG PO CAPS: 200.0000 mg | ORAL_CAPSULE | Freq: Two times a day (BID) | ORAL | 0 refills | Status: DC

## 2024-09-15 MED ORDER — KETOROLAC TROMETHAMINE 15 MG/ML IJ SOLN
30.0000 mg | Freq: Once | INTRAMUSCULAR | Status: AC
  Administered 2024-09-15: 30 mg via INTRAMUSCULAR
  Filled 2024-09-15: qty 2

## 2024-09-15 MED ORDER — LIDOCAINE 5 % EX PTCH
1.0000 | MEDICATED_PATCH | CUTANEOUS | Status: DC
  Administered 2024-09-15: 1 via TRANSDERMAL
  Filled 2024-09-15: qty 1

## 2024-09-15 MED ORDER — PREDNISONE 10 MG (21) PO TBPK: ORAL_TABLET | Freq: Every day | ORAL | 0 refills | Status: DC

## 2024-09-15 MED ORDER — ACETAMINOPHEN 500 MG PO TABS
1000.0000 mg | ORAL_TABLET | Freq: Once | ORAL | Status: AC
  Administered 2024-09-15: 1000 mg via ORAL
  Filled 2024-09-15: qty 2

## 2024-09-15 NOTE — ED Triage Notes (Addendum)
 Hx of back pain/ hip pain. Arrives with c/o severe lower back pain starting today.  Denies any injuries/ lifting. Denies bowel or bladder incontinence.  Took tylenol  and advil  PM before arrival.

## 2024-09-15 NOTE — ED Provider Notes (Signed)
 " [place] EMERGENCY DEPARTMENT AT [facility] Provider Note   CSN: [id number] Arrival date & time: 09/15/24  9650     History No chief complaint on file.   HPI Alexander Roberts is a 39 y.o. male presenting for acute on chronic back pain.  Longstanding history going back a few years but state acutely got worse last night. Denies fevers chills nausea vomiting shortness of breath..   Patient's recorded medical, surgical, social, medication list and allergies were reviewed in the Snapshot window as part of the initial history.   Review of Systems   Review of Systems  Constitutional:  Negative for chills and fever.  HENT:  Negative for ear pain and sore throat.   Eyes:  Negative for pain and visual disturbance.  Respiratory:  Negative for cough and shortness of breath.   Cardiovascular:  Negative for chest pain and palpitations.  Gastrointestinal:  Negative for abdominal pain and vomiting.  Genitourinary:  Negative for dysuria and hematuria.  Musculoskeletal:  Positive for back pain. Negative for arthralgias.  Skin:  Negative for color change and rash.  Neurological:  Negative for seizures and syncope.  All other systems reviewed and are negative.   Physical Exam Updated Vital Signs BP 130/85   Pulse 86   Temp 98.1 F (36.7 C) (Oral)   Resp 18   SpO2 100%  Physical Exam Vitals and nursing note reviewed.  Constitutional:      General: He is not in acute distress.    Appearance: He is well-developed.  HENT:     Head: Normocephalic and atraumatic.  Eyes:     Conjunctiva/sclera: Conjunctivae normal.  Cardiovascular:     Rate and Rhythm: Normal rate and regular rhythm.     Heart sounds: No murmur heard. Pulmonary:     Effort: Pulmonary effort is normal. No respiratory distress.     Breath sounds: Normal breath sounds.  Abdominal:     Palpations: Abdomen is soft.     Tenderness: There is no abdominal tenderness.  Musculoskeletal:        General: No swelling.      Cervical back: Neck supple.  Skin:    General: Skin is warm and dry.     Capillary Refill: Capillary refill takes less than 2 seconds.  Neurological:     Mental Status: He is alert.  Psychiatric:        Mood and Affect: Mood normal.      ED Course/ Medical Decision Making/ A&P    Procedures Procedures   Medications Ordered in ED Medications  lidocaine  (LIDODERM ) 5 % 1 patch (1 patch Transdermal Patch Applied 09/15/24 0423)  ketorolac  (TORADOL ) 15 MG/ML injection 30 mg (30 mg Intramuscular Given 09/15/24 0417)  oxyCODONE  (Oxy IR/ROXICODONE ) immediate release tablet 10 mg (10 mg Oral Given 09/15/24 0417)  cyclobenzaprine  (FLEXERIL ) tablet 10 mg (10 mg Oral Given 09/15/24 0417)  acetaminophen  (TYLENOL ) tablet 1,000 mg (1,000 mg Oral Given 09/15/24 0422)   Medical Decision Making:   Alexander Roberts is a 39 y.o. male who presented to the ED today with acute lower back pain over the past 48 hours, detailed above.    Patient placed on continuous vitals and telemetry monitoring while in ED which was reviewed periodically.   On my initial exam, the pt was with an intact neurologic exam, tolerating ambulation with an antalgic gait and p.o. intake without difficulty.  Patient had no abnormal DTRs, no midline spinal tenderness.  Patient endorsing complete sensation of the perineum.  Patient without episodes of fecal or urinary incontinence.  Patient has no focal neurologic deficits and reassuring vital signs at this time.  No obvious physical abnormality or injury on exam. Notably, patient denies recent trauma, is afebrile, and denies IVDU.   Reviewed and confirmed nursing documentation for past medical history, family history, social history.    Initial Assessment:   With the patient's presentation of acute back pain in the above setting, most likely diagnosis is musculoskeletal strain. Other diagnoses were considered including (but not limited to) underlying fracture, epidural hematoma,  cauda equina syndrome, spinal stenosis, spinal malignancy. These are considered less likely due to history of present illness and physical exam findings.   In particular, lack of fever, substantial history of IV drug use, or substantial neurologic abnormality is less consistent with epidural abscess versus discitis or other spinal infection. In particular,  Initial Plan:  Multimodal pain control described and patient informed on safe usage.  Screening evaluation including below radiographic evaluation reviewed and grossly unremarkable at this time. Patient stable for continued outpatient evaluation and management of their musculoskeletal pains.  Patient referred back to primary care provider for continued evaluation and management.   Initial Study Results:   Radiology  DG Lumbar Spine 2-3 Views  Final Result       Disposition:   Based on the above findings, I believe patient is stable for discharge.    Patient and family educated about specific return precautions for given chief complaint and symptoms.  Patient and family educated about follow-up with PCP and Spine Center.  Patient and family expressed understanding of return precautions and need for follow-up. Patient spoken to regarding all imaging and laboratory results and appropriate follow up for these results. All education provided in verbal and written form and time was allowed for answering of patient questions. Patient discharged.          Emergency Department Medication Summary:   Medications  lidocaine  (LIDODERM ) 5 % 1 patch (1 patch Transdermal Patch Applied 09/15/24 0423)  ketorolac  (TORADOL ) 15 MG/ML injection 30 mg (30 mg Intramuscular Given 09/15/24 0417)  oxyCODONE  (Oxy IR/ROXICODONE ) immediate release tablet 10 mg (10 mg Oral Given 09/15/24 0417)  cyclobenzaprine  (FLEXERIL ) tablet 10 mg (10 mg Oral Given 09/15/24 0417)  acetaminophen  (TYLENOL ) tablet 1,000 mg (1,000 mg Oral Given 09/15/24 0422)      Clinical  Impression:  1. Acute low back pain without sciatica, unspecified back pain laterality      Discharge   Final Clinical Impression(s) / ED Diagnoses Final diagnoses:  Acute low back pain without sciatica, unspecified back pain laterality    Rx / DC Orders ED Discharge Orders          Ordered    cyclobenzaprine  (FLEXERIL ) 10 MG tablet  2 times daily PRN        09/15/24 0416    celecoxib  (CELEBREX ) 200 MG capsule  2 times daily        09/15/24 0416    predniSONE  (STERAPRED UNI-PAK 21 TAB) 10 MG (21) TBPK tablet  Daily        09/15/24 0416    lidocaine  (LIDODERM ) 5 %  Every 24 hours        09/15/24 0416              Jerral Meth, MD 09/15/24 0507  "

## 2024-09-17 ENCOUNTER — Emergency Department (HOSPITAL_BASED_OUTPATIENT_CLINIC_OR_DEPARTMENT_OTHER): Admission: EM | Admit: 2024-09-17 | Discharge: 2024-09-17 | Disposition: A | Discharge status: Home / Self Care

## 2024-09-17 ENCOUNTER — Other Ambulatory Visit: Payer: Self-pay

## 2024-09-17 ENCOUNTER — Encounter (HOSPITAL_BASED_OUTPATIENT_CLINIC_OR_DEPARTMENT_OTHER): Payer: Self-pay

## 2024-09-17 DIAGNOSIS — M545 Low back pain, unspecified: Secondary | ICD-10-CM | POA: Insufficient documentation

## 2024-09-17 MED ORDER — OXYCODONE-ACETAMINOPHEN 5-325 MG PO TABS
2.0000 | ORAL_TABLET | Freq: Once | ORAL | Status: AC
  Administered 2024-09-17: 2 via ORAL
  Filled 2024-09-17: qty 2

## 2024-09-17 MED ORDER — KETOROLAC TROMETHAMINE 15 MG/ML IJ SOLN
15.0000 mg | Freq: Once | INTRAMUSCULAR | Status: AC
  Administered 2024-09-17: 15 mg via INTRAMUSCULAR
  Filled 2024-09-17: qty 1

## 2024-09-17 MED ORDER — ACETAMINOPHEN 325 MG PO TABS
650.0000 mg | ORAL_TABLET | Freq: Once | ORAL | Status: AC
  Administered 2024-09-17: 650 mg via ORAL
  Filled 2024-09-17: qty 2

## 2024-09-17 MED ORDER — LIDOCAINE 5 % EX PTCH
1.0000 | MEDICATED_PATCH | CUTANEOUS | Status: DC
  Administered 2024-09-17: 1 via TRANSDERMAL
  Filled 2024-09-17: qty 1

## 2024-09-17 MED ORDER — METHOCARBAMOL 500 MG PO TABS
500.0000 mg | ORAL_TABLET | Freq: Once | ORAL | Status: AC
  Administered 2024-09-17: 500 mg via ORAL
  Filled 2024-09-17: qty 1

## 2024-09-17 MED ORDER — OXYCODONE-ACETAMINOPHEN 5-325 MG PO TABS: 1.0000 | ORAL_TABLET | Freq: Four times a day (QID) | ORAL | 0 refills | Status: DC | PRN

## 2024-09-17 NOTE — ED Provider Notes (Signed)
 " [place] EMERGENCY DEPARTMENT AT [facility] Provider Note   CSN: [id number] Arrival date & time: 09/17/24  1224     Patient presents with: Back Pain   Alexander Roberts is a 39 y.o. male patient who presents to the emergency department today for further evaluation of acute on chronic back pain.  Patient was seen here on 09/15/2024 for similar symptoms.  Patient received Tylenol , Toradol , and narcotic pain medication in the emergency department which did improve his pain significantly.  He was sent home with steroids, additional NSAIDs, lidocaine  patches.  He was unable to fill the lidocaine  patches due to insurance issues.  Patient states he has not had any relief with the medications at home.  He is unable to perform his ADLs.  He states he can barely get around secondary to pain.  He denies any numbness or weakness to lower extremities.  No bowel or bladder incontinence.  No fever or chills.    Back Pain      Prior to Admission medications  Medication Sig Start Date End Date Taking? Authorizing Provider  oxyCODONE -acetaminophen  (PERCOCET/ROXICET) 5-325 MG tablet Take 1 tablet by mouth every 6 (six) hours as needed for severe pain (pain score 7-10). 09/17/24  Yes Theotis, Crew Goren M, PA-C  celecoxib  (CELEBREX ) 200 MG capsule Take 1 capsule (200 mg total) by mouth 2 (two) times daily. 09/15/24   Jerral Meth, MD  clindamycin  (CLEOCIN ) 300 MG capsule Take 1 capsule (300 mg total) by mouth 3 (three) times daily. 10/24/19   Jesus Oliphant, MD  clindamycin  (CLEOCIN ) 300 MG capsule Take 1 capsule (300 mg total) by mouth 3 (three) times daily. 10/24/19   Jesus Oliphant, MD  cyclobenzaprine  (FLEXERIL ) 10 MG tablet Take 1 tablet (10 mg total) by mouth 2 (two) times daily as needed for muscle spasms. 09/15/24   Jerral Meth, MD  gabapentin  (NEURONTIN ) 300 MG capsule Take 300 mg by mouth 2 (two) times daily. 10/09/19   [provider]  HYDROcodone -acetaminophen  (HYCET) 7.5-325 mg/15  ml solution Take 15 mLs by mouth 4 (four) times daily as needed for moderate pain. 10/24/19   Jesus Oliphant, MD  ibuprofen  (ADVIL ) 200 MG tablet Take 400-600 mg by mouth every 8 (eight) hours as needed (pain.).    [provider]  lidocaine  (LIDODERM ) 5 % Place 1 patch onto the skin daily. Remove & Discard patch within 12 hours or as directed by MD 09/15/24   Jerral Meth, MD  meloxicam  (MOBIC ) 7.5 MG tablet Take 7.5 mg by mouth daily. 10/07/19   [provider]  Multiple Vitamin (MULTIVITAMIN WITH MINERALS) TABS tablet Take 1 tablet by mouth daily. Centrum Multivitamin    [provider]  predniSONE  (STERAPRED UNI-PAK 21 TAB) 10 MG (21) TBPK tablet Take by mouth daily. Take 6 tabs by mouth daily  for 2 days, then 5 tabs for 2 days, then 4 tabs for 2 days, then 3 tabs for 2 days, 2 tabs for 2 days, then 1 tab by mouth daily for 2 days 09/15/24   Jerral Meth, MD  promethazine  (PHENERGAN ) 25 MG suppository Place 1 suppository (25 mg total) rectally every 6 (six) hours as needed for nausea or vomiting. 10/24/19   Jesus Oliphant, MD    Allergies: Patient has no known allergies.    Review of Systems  Musculoskeletal:  Positive for back pain.  All other systems reviewed and are negative.   Updated Vital Signs BP (!) 133/105 (BP Location: Left Leg)   Pulse 93  Temp 97.7 F (36.5 C)   Resp (!) 21   Ht 6' 3 (1.905 m)   Wt (!) 145 kg   SpO2 100%   BMI 39.96 kg/m   Physical Exam Vitals and nursing note reviewed.  Constitutional:      Appearance: Normal appearance.  HENT:     Head: Normocephalic and atraumatic.  Eyes:     General:        Right eye: No discharge.        Left eye: No discharge.     Conjunctiva/sclera: Conjunctivae normal.  Pulmonary:     Effort: Pulmonary effort is normal.  Skin:    General: Skin is warm and dry.     Findings: No rash.  Neurological:     General: No focal deficit present.     Mental Status: He is alert.  Psychiatric:         Mood and Affect: Mood normal.        Behavior: Behavior normal.     (all labs ordered are listed, but only abnormal results are displayed) Labs Reviewed - No data to display  EKG: None  Radiology: No results found.   Procedures   Medications Ordered in the ED  lidocaine  (LIDODERM ) 5 % 1 patch (1 patch Transdermal Patch Applied 09/17/24 1515)  ketorolac  (TORADOL ) 15 MG/ML injection 15 mg (15 mg Intramuscular Given 09/17/24 1356)  oxyCODONE -acetaminophen  (PERCOCET/ROXICET) 5-325 MG per tablet 2 tablet (2 tablets Oral Given 09/17/24 1355)  acetaminophen  (TYLENOL ) tablet 650 mg (650 mg Oral Given 09/17/24 1355)  ketorolac  (TORADOL ) 15 MG/ML injection 15 mg (15 mg Intramuscular Given 09/17/24 1532)  methocarbamol  (ROBAXIN ) tablet 500 mg (500 mg Oral Given 09/17/24 1532)     Medical Decision Making Alexander Roberts is a 40 y.o. male patient who presents to the emergency department today for further evaluation of acute on chronic back pain.  I have low suspicion for cauda equina syndrome, epidural abscess, vascular compromise, or other emergent causes of his back pain.  Will plan to give him some narcotic pain medication, additional Tylenol , Toradol  and plan to reassess.  Patient feeling better.  Will plan to discharge home.  Will have him continue previous course of medications from previous ED visit.  Will add on Percocet.  Will not give him any additional Tylenol .  Has an appointment with spinal specialist on Wednesday. Told him to keep that appointment. Strict return precautions given, he is safe for discharge.    Risk OTC drugs. Prescription drug management.     Final diagnoses:  Acute midline low back pain without sciatica    ED Discharge Orders          Ordered    oxyCODONE -acetaminophen  (PERCOCET/ROXICET) 5-325 MG tablet  Every 6 hours PRN        09/17/24 1458               Theotis Cameron HERO, PA-C 09/17/24 1920    Ula Prentice SAUNDERS, MD 09/25/24 1514  "

## 2024-09-17 NOTE — Discharge Instructions (Addendum)
 As we discussed, I would like for you to take the Percocet like we talked about.  Use for breakthrough pain.  I would follow-up with your primary care doctor and the spine specialist on Wednesday.  Return to the Emergency Department for any worsening symptoms.

## 2024-09-17 NOTE — ED Triage Notes (Signed)
 Pt reports lower back pain x5 days. Pt denies any injury or trauma. Pt seen for same x2 days ago.

## 2024-09-20 ENCOUNTER — Other Ambulatory Visit (HOSPITAL_COMMUNITY): Payer: Self-pay | Admitting: Neurosurgery

## 2024-09-20 ENCOUNTER — Ambulatory Visit (HOSPITAL_COMMUNITY)
Admission: RE | Admit: 2024-09-20 | Discharge: 2024-09-20 | Disposition: A | Discharge status: Home / Self Care | Source: Ambulatory Visit | Attending: Neurosurgery | Admitting: Neurosurgery

## 2024-09-20 DIAGNOSIS — M5416 Radiculopathy, lumbar region: Secondary | ICD-10-CM | POA: Insufficient documentation

## 2024-09-24 ENCOUNTER — Inpatient Hospital Stay (HOSPITAL_BASED_OUTPATIENT_CLINIC_OR_DEPARTMENT_OTHER)
Admission: EM | Admit: 2024-09-24 | Discharge: 2024-09-29 | DRG: 518 | Disposition: A | Discharge status: Rehabilitation Facility | Attending: Internal Medicine | Admitting: Internal Medicine

## 2024-09-24 ENCOUNTER — Observation Stay (HOSPITAL_COMMUNITY): Admitting: Anesthesiology

## 2024-09-24 ENCOUNTER — Encounter (HOSPITAL_BASED_OUTPATIENT_CLINIC_OR_DEPARTMENT_OTHER): Payer: Self-pay

## 2024-09-24 ENCOUNTER — Encounter (HOSPITAL_COMMUNITY)
Admission: EM | Disposition: A | Discharge status: Rehabilitation Facility | Payer: Self-pay | Source: Home / Self Care | Attending: Internal Medicine

## 2024-09-24 ENCOUNTER — Observation Stay (HOSPITAL_COMMUNITY)

## 2024-09-24 ENCOUNTER — Other Ambulatory Visit: Payer: Self-pay

## 2024-09-24 DIAGNOSIS — M4807 Spinal stenosis, lumbosacral region: Secondary | ICD-10-CM | POA: Diagnosis present

## 2024-09-24 DIAGNOSIS — A491 Streptococcal infection, unspecified site: Secondary | ICD-10-CM

## 2024-09-24 DIAGNOSIS — Z720 Tobacco use: Secondary | ICD-10-CM | POA: Insufficient documentation

## 2024-09-24 DIAGNOSIS — R7881 Bacteremia: Secondary | ICD-10-CM | POA: Diagnosis present

## 2024-09-24 DIAGNOSIS — M545 Low back pain, unspecified: Secondary | ICD-10-CM

## 2024-09-24 DIAGNOSIS — M48061 Spinal stenosis, lumbar region without neurogenic claudication: Secondary | ICD-10-CM | POA: Diagnosis present

## 2024-09-24 DIAGNOSIS — M4626 Osteomyelitis of vertebra, lumbar region: Principal | ICD-10-CM | POA: Diagnosis present

## 2024-09-24 DIAGNOSIS — K6812 Psoas muscle abscess: Secondary | ICD-10-CM | POA: Diagnosis present

## 2024-09-24 DIAGNOSIS — E66813 Obesity, class 3: Secondary | ICD-10-CM | POA: Diagnosis present

## 2024-09-24 DIAGNOSIS — G8929 Other chronic pain: Secondary | ICD-10-CM | POA: Diagnosis present

## 2024-09-24 DIAGNOSIS — Z6841 Body Mass Index (BMI) 40.0 and over, adult: Secondary | ICD-10-CM

## 2024-09-24 DIAGNOSIS — F4322 Adjustment disorder with anxiety: Secondary | ICD-10-CM | POA: Diagnosis present

## 2024-09-24 DIAGNOSIS — M549 Dorsalgia, unspecified: Secondary | ICD-10-CM | POA: Diagnosis present

## 2024-09-24 DIAGNOSIS — Z79899 Other long term (current) drug therapy: Secondary | ICD-10-CM

## 2024-09-24 DIAGNOSIS — E559 Vitamin D deficiency, unspecified: Secondary | ICD-10-CM | POA: Diagnosis present

## 2024-09-24 DIAGNOSIS — G834 Cauda equina syndrome: Secondary | ICD-10-CM | POA: Diagnosis present

## 2024-09-24 DIAGNOSIS — M869 Osteomyelitis, unspecified: Principal | ICD-10-CM | POA: Diagnosis present

## 2024-09-24 DIAGNOSIS — Z716 Tobacco abuse counseling: Secondary | ICD-10-CM

## 2024-09-24 DIAGNOSIS — K592 Neurogenic bowel, not elsewhere classified: Secondary | ICD-10-CM | POA: Diagnosis present

## 2024-09-24 DIAGNOSIS — G061 Intraspinal abscess and granuloma: Secondary | ICD-10-CM | POA: Diagnosis present

## 2024-09-24 DIAGNOSIS — F17210 Nicotine dependence, cigarettes, uncomplicated: Secondary | ICD-10-CM | POA: Diagnosis present

## 2024-09-24 DIAGNOSIS — F32A Depression, unspecified: Secondary | ICD-10-CM | POA: Diagnosis present

## 2024-09-24 DIAGNOSIS — R531 Weakness: Secondary | ICD-10-CM

## 2024-09-24 DIAGNOSIS — B955 Unspecified streptococcus as the cause of diseases classified elsewhere: Secondary | ICD-10-CM | POA: Diagnosis present

## 2024-09-24 DIAGNOSIS — M4646 Discitis, unspecified, lumbar region: Secondary | ICD-10-CM | POA: Diagnosis present

## 2024-09-24 DIAGNOSIS — G062 Extradural and subdural abscess, unspecified: Principal | ICD-10-CM

## 2024-09-24 DIAGNOSIS — M4640 Discitis, unspecified, site unspecified: Secondary | ICD-10-CM | POA: Diagnosis present

## 2024-09-24 DIAGNOSIS — G4700 Insomnia, unspecified: Secondary | ICD-10-CM | POA: Diagnosis present

## 2024-09-24 DIAGNOSIS — M4800 Spinal stenosis, site unspecified: Secondary | ICD-10-CM | POA: Diagnosis present

## 2024-09-24 LAB — HIV ANTIBODY (ROUTINE TESTING W REFLEX): HIV Screen 4th Generation wRfx: NONREACTIVE

## 2024-09-24 LAB — CBC WITH DIFFERENTIAL/PLATELET
Abs Immature Granulocytes: 0.28 10*3/uL — ABNORMAL HIGH (ref 0.00–0.07)
Basophils Absolute: 0.1 10*3/uL (ref 0.0–0.1)
Basophils Relative: 0 %
Eosinophils Absolute: 0.1 10*3/uL (ref 0.0–0.5)
Eosinophils Relative: 0 %
HCT: 36.9 % — ABNORMAL LOW (ref 39.0–52.0)
Hemoglobin: 12.7 g/dL — ABNORMAL LOW (ref 13.0–17.0)
Immature Granulocytes: 2 %
Lymphocytes Relative: 11 %
Lymphs Abs: 1.4 10*3/uL (ref 0.7–4.0)
MCH: 27.8 pg (ref 26.0–34.0)
MCHC: 34.4 g/dL (ref 30.0–36.0)
MCV: 80.7 fL (ref 80.0–100.0)
Monocytes Absolute: 1.2 10*3/uL — ABNORMAL HIGH (ref 0.1–1.0)
Monocytes Relative: 9 %
Neutro Abs: 10.4 10*3/uL — ABNORMAL HIGH (ref 1.7–7.7)
Neutrophils Relative %: 78 %
Platelets: 330 10*3/uL (ref 150–400)
RBC: 4.57 MIL/uL (ref 4.22–5.81)
RDW: 14.3 % (ref 11.5–15.5)
WBC: 13.5 10*3/uL — ABNORMAL HIGH (ref 4.0–10.5)
nRBC: 0 % (ref 0.0–0.2)

## 2024-09-24 LAB — COMPREHENSIVE METABOLIC PANEL WITH GFR
ALT: 30 U/L (ref 0–44)
AST: 17 U/L (ref 15–41)
Albumin: 3.7 g/dL (ref 3.5–5.0)
Alkaline Phosphatase: 106 U/L (ref 38–126)
Anion gap: 12 (ref 5–15)
BUN: 11 mg/dL (ref 6–20)
CO2: 27 mmol/L (ref 22–32)
Calcium: 10.2 mg/dL (ref 8.9–10.3)
Chloride: 96 mmol/L — ABNORMAL LOW (ref 98–111)
Creatinine, Ser: 0.78 mg/dL (ref 0.61–1.24)
GFR, Estimated: >60 mL/min
Glucose, Bld: 110 mg/dL — ABNORMAL HIGH (ref 70–99)
Potassium: 4.3 mmol/L (ref 3.5–5.1)
Sodium: 135 mmol/L (ref 135–145)
Total Bilirubin: 0.6 mg/dL (ref 0.0–1.2)
Total Protein: 7.3 g/dL (ref 6.5–8.1)

## 2024-09-24 LAB — SEDIMENTATION RATE: Sed Rate: 55 mm/h — ABNORMAL HIGH (ref 0–16)

## 2024-09-24 LAB — PROTIME-INR
INR: 1 (ref 0.8–1.2)
Prothrombin Time: 13.9 s (ref 11.4–15.2)

## 2024-09-24 LAB — SURGICAL PCR SCREEN
MRSA, PCR: NEGATIVE
Staphylococcus aureus: NEGATIVE

## 2024-09-24 LAB — LACTIC ACID, PLASMA: Lactic Acid, Venous: 0.9 mmol/L (ref 0.5–1.9)

## 2024-09-24 LAB — MAGNESIUM: Magnesium: 1.7 mg/dL (ref 1.7–2.4)

## 2024-09-24 LAB — CREATININE, SERUM
Creatinine, Ser: 0.72 mg/dL (ref 0.61–1.24)
GFR, Estimated: >60 mL/min

## 2024-09-24 LAB — CBG MONITORING, ED: Glucose-Capillary: 97 mg/dL (ref 70–99)

## 2024-09-24 LAB — PHOSPHORUS: Phosphorus: 4.3 mg/dL (ref 2.5–4.6)

## 2024-09-24 LAB — PROCALCITONIN: Procalcitonin: 0.24 ng/mL

## 2024-09-24 MED ORDER — SENNOSIDES-DOCUSATE SODIUM 8.6-50 MG PO TABS: 1.0000 | ORAL_TABLET | Freq: Every evening | ORAL | Status: DC | PRN

## 2024-09-24 MED ORDER — SODIUM CHLORIDE 0.9 % IV SOLN
2.0000 g | Freq: Three times a day (TID) | INTRAVENOUS | Status: DC
  Administered 2024-09-24 – 2024-09-25 (×3): 2 g via INTRAVENOUS
  Filled 2024-09-24 (×3): qty 12.5

## 2024-09-24 MED ORDER — HEPARIN SODIUM (PORCINE) 5000 UNIT/ML IJ SOLN: 5000.0000 [IU] | Freq: Three times a day (TID) | INTRAMUSCULAR | Status: DC

## 2024-09-24 MED ORDER — ORAL CARE MOUTH RINSE: 15.0000 mL | Freq: Once | OROMUCOSAL | Status: AC

## 2024-09-24 MED ORDER — VANCOMYCIN HCL 1750 MG/350ML IV SOLN
1750.0000 mg | Freq: Three times a day (TID) | INTRAVENOUS | Status: DC
  Administered 2024-09-24 – 2024-09-25 (×2): 1750 mg via INTRAVENOUS
  Filled 2024-09-24 (×4): qty 350

## 2024-09-24 MED ORDER — PROPOFOL 10 MG/ML IV BOLUS
INTRAVENOUS | Status: DC | PRN
  Administered 2024-09-24: 200 mg via INTRAVENOUS

## 2024-09-24 MED ORDER — 0.9 % SODIUM CHLORIDE (POUR BTL) OPTIME
TOPICAL | Status: DC | PRN
  Administered 2024-09-24: 1000 mL

## 2024-09-24 MED ORDER — HYDROMORPHONE HCL 1 MG/ML IJ SOLN
INTRAMUSCULAR | Status: AC
  Filled 2024-09-24: qty 0.5

## 2024-09-24 MED ORDER — VANCOMYCIN HCL IN DEXTROSE 1-5 GM/200ML-% IV SOLN
INTRAVENOUS | Status: AC
  Filled 2024-09-24: qty 400

## 2024-09-24 MED ORDER — HYDROMORPHONE HCL 1 MG/ML IJ SOLN
1.0000 mg | Freq: Once | INTRAMUSCULAR | Status: AC
  Administered 2024-09-24: 1 mg via INTRAVENOUS
  Filled 2024-09-24: qty 1

## 2024-09-24 MED ORDER — HYDROMORPHONE HCL 1 MG/ML IJ SOLN
0.5000 mg | INTRAMUSCULAR | Status: DC | PRN
  Administered 2024-09-24 – 2024-09-29 (×20): 1 mg via INTRAVENOUS
  Filled 2024-09-24 (×20): qty 1

## 2024-09-24 MED ORDER — AMISULPRIDE (ANTIEMETIC) 5 MG/2ML IV SOLN
10.0000 mg | Freq: Once | INTRAVENOUS | Status: AC | PRN
  Administered 2024-09-24: 10 mg via INTRAVENOUS

## 2024-09-24 MED ORDER — THROMBIN 5000 UNITS EX KIT
PACK | CUTANEOUS | Status: AC
  Filled 2024-09-24: qty 3

## 2024-09-24 MED ORDER — BUPIVACAINE HCL (PF) 0.25 % IJ SOLN
INTRAMUSCULAR | Status: AC
  Filled 2024-09-24: qty 30

## 2024-09-24 MED ORDER — VANCOMYCIN HCL IN DEXTROSE 1-5 GM/200ML-% IV SOLN
1000.0000 mg | Freq: Once | INTRAVENOUS | Status: AC
  Administered 2024-09-24: 1000 mg via INTRAVENOUS
  Filled 2024-09-24 (×2): qty 200

## 2024-09-24 MED ORDER — DEXAMETHASONE SOD PHOSPHATE PF 10 MG/ML IJ SOLN
INTRAMUSCULAR | Status: DC | PRN
  Administered 2024-09-24: 10 mg via INTRAVENOUS

## 2024-09-24 MED ORDER — CELECOXIB 200 MG PO CAPS
200.0000 mg | ORAL_CAPSULE | Freq: Two times a day (BID) | ORAL | Status: DC
  Administered 2024-09-25 – 2024-09-29 (×9): 200 mg via ORAL
  Filled 2024-09-24 (×12): qty 1

## 2024-09-24 MED ORDER — POTASSIUM CHLORIDE IN NACL 20-0.9 MEQ/L-% IV SOLN
INTRAVENOUS | Status: DC
  Filled 2024-09-24 (×2): qty 1000

## 2024-09-24 MED ORDER — SUGAMMADEX SODIUM 200 MG/2ML IV SOLN
INTRAVENOUS | Status: DC | PRN
  Administered 2024-09-24: 400 mg via INTRAVENOUS

## 2024-09-24 MED ORDER — ACETAMINOPHEN 10 MG/ML IV SOLN: 1000.0000 mg | Freq: Once | INTRAVENOUS | Status: DC | PRN

## 2024-09-24 MED ORDER — BISACODYL 5 MG PO TBEC
5.0000 mg | DELAYED_RELEASE_TABLET | Freq: Every day | ORAL | Status: DC | PRN
  Administered 2024-09-25: 5 mg via ORAL
  Filled 2024-09-24: qty 1

## 2024-09-24 MED ORDER — OXYCODONE HCL 5 MG PO TABS
ORAL_TABLET | ORAL | Status: AC
  Filled 2024-09-24: qty 1

## 2024-09-24 MED ORDER — ONDANSETRON HCL 4 MG/2ML IJ SOLN
4.0000 mg | Freq: Once | INTRAMUSCULAR | Status: AC
  Administered 2024-09-24: 4 mg via INTRAVENOUS
  Filled 2024-09-24: qty 2

## 2024-09-24 MED ORDER — HYDROMORPHONE HCL 1 MG/ML IJ SOLN
0.2500 mg | INTRAMUSCULAR | Status: DC | PRN
  Administered 2024-09-24 (×4): 0.5 mg via INTRAVENOUS

## 2024-09-24 MED ORDER — SODIUM CHLORIDE 0.9 % IV SOLN
2.0000 g | Freq: Once | INTRAVENOUS | Status: AC
  Administered 2024-09-24: 2 g via INTRAVENOUS
  Filled 2024-09-24: qty 12.5

## 2024-09-24 MED ORDER — SODIUM CHLORIDE 0.9% FLUSH
3.0000 mL | Freq: Two times a day (BID) | INTRAVENOUS | Status: DC
  Administered 2024-09-24 – 2024-09-27 (×6): 3 mL via INTRAVENOUS

## 2024-09-24 MED ORDER — THROMBIN 20000 UNITS EX SOLR: CUTANEOUS | Status: DC | PRN

## 2024-09-24 MED ORDER — SURGIRINSE WOUND IRRIGATION SYSTEM - OPTIME: TOPICAL | Status: DC | PRN

## 2024-09-24 MED ORDER — HYDROMORPHONE HCL 1 MG/ML IJ SOLN
INTRAMUSCULAR | Status: AC
  Filled 2024-09-24: qty 1

## 2024-09-24 MED ORDER — CHLORHEXIDINE GLUCONATE 0.12 % MT SOLN
15.0000 mL | Freq: Once | OROMUCOSAL | Status: AC
  Administered 2024-09-24: 15 mL via OROMUCOSAL

## 2024-09-24 MED ORDER — OXYCODONE HCL 5 MG PO TABS
5.0000 mg | ORAL_TABLET | Freq: Once | ORAL | Status: AC | PRN
  Administered 2024-09-24: 5 mg via ORAL

## 2024-09-24 MED ORDER — FENTANYL CITRATE (PF) 250 MCG/5ML IJ SOLN
INTRAMUSCULAR | Status: DC | PRN
  Administered 2024-09-24: 100 ug via INTRAVENOUS
  Administered 2024-09-24: 50 ug via INTRAVENOUS
  Administered 2024-09-24: 100 ug via INTRAVENOUS

## 2024-09-24 MED ORDER — OXYCODONE HCL 5 MG PO TABS
5.0000 mg | ORAL_TABLET | ORAL | Status: DC | PRN
  Administered 2024-09-25 – 2024-09-29 (×19): 5 mg via ORAL
  Filled 2024-09-24 (×19): qty 1

## 2024-09-24 MED ORDER — NICOTINE 14 MG/24HR TD PT24
14.0000 mg | MEDICATED_PATCH | Freq: Every day | TRANSDERMAL | Status: DC
  Administered 2024-09-24 – 2024-09-29 (×6): 14 mg via TRANSDERMAL
  Filled 2024-09-24 (×6): qty 1

## 2024-09-24 MED ORDER — SENNOSIDES-DOCUSATE SODIUM 8.6-50 MG PO TABS
1.0000 | ORAL_TABLET | Freq: Two times a day (BID) | ORAL | Status: DC
  Administered 2024-09-24 – 2024-09-29 (×10): 1 via ORAL
  Filled 2024-09-24 (×10): qty 1

## 2024-09-24 MED ORDER — LACTATED RINGERS IV SOLN: INTRAVENOUS | Status: DC

## 2024-09-24 MED ORDER — THROMBIN 20000 UNITS EX SOLR
CUTANEOUS | Status: AC
  Filled 2024-09-24: qty 20000

## 2024-09-24 MED ORDER — AMISULPRIDE (ANTIEMETIC) 5 MG/2ML IV SOLN
INTRAVENOUS | Status: AC
  Filled 2024-09-24: qty 4

## 2024-09-24 MED ORDER — ACETAMINOPHEN 500 MG PO TABS
1000.0000 mg | ORAL_TABLET | Freq: Four times a day (QID) | ORAL | Status: AC
  Administered 2024-09-25 (×4): 1000 mg via ORAL
  Filled 2024-09-24 (×4): qty 2

## 2024-09-24 MED ORDER — METHOCARBAMOL 1000 MG/10ML IJ SOLN
INTRAMUSCULAR | Status: AC
  Filled 2024-09-24: qty 10

## 2024-09-24 MED ORDER — ONDANSETRON HCL 4 MG PO TABS
4.0000 mg | ORAL_TABLET | Freq: Four times a day (QID) | ORAL | Status: DC | PRN
  Administered 2024-09-27: 4 mg via ORAL
  Filled 2024-09-24: qty 1

## 2024-09-24 MED ORDER — PHENOL 1.4 % MT LIQD: 1.0000 | OROMUCOSAL | Status: DC | PRN

## 2024-09-24 MED ORDER — PROPOFOL 10 MG/ML IV BOLUS
INTRAVENOUS | Status: AC
  Filled 2024-09-24: qty 20

## 2024-09-24 MED ORDER — LORAZEPAM 2 MG/ML IJ SOLN
1.0000 mg | Freq: Once | INTRAMUSCULAR | Status: AC
  Administered 2024-09-24: 1 mg via INTRAVENOUS
  Filled 2024-09-24: qty 1

## 2024-09-24 MED ORDER — MENTHOL 3 MG MT LOZG: 1.0000 | LOZENGE | OROMUCOSAL | Status: DC | PRN

## 2024-09-24 MED ORDER — ACETAMINOPHEN 325 MG PO TABS: 650.0000 mg | ORAL_TABLET | Freq: Four times a day (QID) | ORAL | Status: DC | PRN

## 2024-09-24 MED ORDER — HYDROMORPHONE HCL 1 MG/ML IJ SOLN
1.0000 mg | INTRAMUSCULAR | Status: DC | PRN
  Administered 2024-09-24 (×2): 1 mg via INTRAVENOUS
  Filled 2024-09-24 (×2): qty 1

## 2024-09-24 MED ORDER — THROMBIN 5000 UNITS EX SOLR: OROMUCOSAL | Status: DC | PRN

## 2024-09-24 MED ORDER — FENTANYL CITRATE (PF) 250 MCG/5ML IJ SOLN
INTRAMUSCULAR | Status: AC
  Filled 2024-09-24: qty 5

## 2024-09-24 MED ORDER — IPRATROPIUM BROMIDE 0.02 % IN SOLN: 0.5000 mg | Freq: Four times a day (QID) | RESPIRATORY_TRACT | Status: DC | PRN

## 2024-09-24 MED ORDER — CHLORHEXIDINE GLUCONATE 0.12 % MT SOLN
OROMUCOSAL | Status: AC
  Filled 2024-09-24: qty 15

## 2024-09-24 MED ORDER — SODIUM CHLORIDE 0.9 % IV SOLN: 250.0000 mL | INTRAVENOUS | Status: AC

## 2024-09-24 MED ORDER — SODIUM CHLORIDE 0.9 % IV SOLN: INTRAVENOUS | Status: DC

## 2024-09-24 MED ORDER — ZOLPIDEM TARTRATE 5 MG PO TABS: 5.0000 mg | ORAL_TABLET | Freq: Every evening | ORAL | Status: DC | PRN

## 2024-09-24 MED ORDER — ONDANSETRON HCL 4 MG/2ML IJ SOLN: 4.0000 mg | Freq: Once | INTRAMUSCULAR | Status: DC | PRN

## 2024-09-24 MED ORDER — HYDROMORPHONE HCL 1 MG/ML IJ SOLN
INTRAMUSCULAR | Status: DC | PRN
  Administered 2024-09-24: .5 mg via INTRAVENOUS

## 2024-09-24 MED ORDER — ONDANSETRON HCL 4 MG/2ML IJ SOLN
INTRAMUSCULAR | Status: AC
  Filled 2024-09-24: qty 2

## 2024-09-24 MED ORDER — LACTATED RINGERS IV BOLUS
1000.0000 mL | Freq: Once | INTRAVENOUS | Status: AC
  Administered 2024-09-24: 1000 mL via INTRAVENOUS

## 2024-09-24 MED ORDER — HYDRALAZINE HCL 20 MG/ML IJ SOLN: 10.0000 mg | INTRAMUSCULAR | Status: DC | PRN

## 2024-09-24 MED ORDER — MIDAZOLAM HCL (PF) 2 MG/2ML IJ SOLN
INTRAMUSCULAR | Status: DC | PRN
  Administered 2024-09-24: 2 mg via INTRAVENOUS

## 2024-09-24 MED ORDER — MIDAZOLAM HCL 2 MG/2ML IJ SOLN
INTRAMUSCULAR | Status: AC
  Filled 2024-09-24: qty 2

## 2024-09-24 MED ORDER — ORAL CARE MOUTH RINSE: 15.0000 mL | Freq: Once | OROMUCOSAL | Status: DC

## 2024-09-24 MED ORDER — BUPIVACAINE HCL (PF) 0.25 % IJ SOLN
INTRAMUSCULAR | Status: DC | PRN
  Administered 2024-09-24: 10 mL

## 2024-09-24 MED ORDER — OXYCODONE HCL 5 MG PO TABS
5.0000 mg | ORAL_TABLET | ORAL | Status: DC | PRN
  Administered 2024-09-24: 5 mg via ORAL
  Filled 2024-09-24: qty 1

## 2024-09-24 MED ORDER — ONDANSETRON HCL 4 MG/2ML IJ SOLN
INTRAMUSCULAR | Status: DC | PRN
  Administered 2024-09-24: 4 mg via INTRAVENOUS

## 2024-09-24 MED ORDER — VANCOMYCIN HCL 1000 MG IV SOLR
INTRAVENOUS | Status: DC | PRN
  Administered 2024-09-24: 1000 mg via INTRAVENOUS

## 2024-09-24 MED ORDER — LACTATED RINGERS IV SOLN: INTRAVENOUS | Status: AC

## 2024-09-24 MED ORDER — LACTATED RINGERS IV SOLN: INTRAVENOUS | Status: DC | PRN

## 2024-09-24 MED ORDER — ACETAMINOPHEN 650 MG RE SUPP: 650.0000 mg | Freq: Four times a day (QID) | RECTAL | Status: DC | PRN

## 2024-09-24 MED ORDER — DEXMEDETOMIDINE HCL IN NACL 80 MCG/20ML IV SOLN
INTRAVENOUS | Status: DC | PRN
  Administered 2024-09-24: 4 ug via INTRAVENOUS
  Administered 2024-09-24 (×2): 8 ug via INTRAVENOUS

## 2024-09-24 MED ORDER — ACETAMINOPHEN 10 MG/ML IV SOLN
INTRAVENOUS | Status: AC
  Filled 2024-09-24: qty 100

## 2024-09-24 MED ORDER — OXYCODONE HCL 5 MG/5ML PO SOLN: 5.0000 mg | Freq: Once | ORAL | Status: AC | PRN

## 2024-09-24 MED ORDER — ONDANSETRON HCL 4 MG/2ML IJ SOLN
4.0000 mg | Freq: Four times a day (QID) | INTRAMUSCULAR | Status: DC | PRN
  Administered 2024-09-24 – 2024-09-26 (×2): 4 mg via INTRAVENOUS
  Filled 2024-09-24: qty 2

## 2024-09-24 MED ORDER — SODIUM CHLORIDE 0.9% FLUSH: 3.0000 mL | INTRAVENOUS | Status: DC | PRN

## 2024-09-24 MED ORDER — ACETAMINOPHEN 10 MG/ML IV SOLN
INTRAVENOUS | Status: DC | PRN
  Administered 2024-09-24: 1000 mg via INTRAVENOUS

## 2024-09-24 MED ORDER — CHLORHEXIDINE GLUCONATE 0.12 % MT SOLN: 15.0000 mL | Freq: Once | OROMUCOSAL | Status: DC

## 2024-09-24 MED ORDER — SODIUM CHLORIDE 0.9% FLUSH
3.0000 mL | Freq: Two times a day (BID) | INTRAVENOUS | Status: DC
  Administered 2024-09-24 – 2024-09-29 (×9): 3 mL via INTRAVENOUS

## 2024-09-24 MED ORDER — METHOCARBAMOL 1000 MG/10ML IJ SOLN
500.0000 mg | Freq: Once | INTRAMUSCULAR | Status: AC
  Administered 2024-09-24: 500 mg via INTRAVENOUS

## 2024-09-24 MED ORDER — METHOCARBAMOL 500 MG PO TABS
500.0000 mg | ORAL_TABLET | Freq: Four times a day (QID) | ORAL | Status: DC | PRN
  Administered 2024-09-24 – 2024-09-29 (×9): 500 mg via ORAL
  Filled 2024-09-24 (×9): qty 1

## 2024-09-24 MED ORDER — SODIUM CHLORIDE 0.9% FLUSH
3.0000 mL | Freq: Two times a day (BID) | INTRAVENOUS | Status: DC
  Administered 2024-09-24 – 2024-09-28 (×7): 3 mL via INTRAVENOUS

## 2024-09-24 MED ORDER — GADOBUTROL 1 MMOL/ML IV SOLN
10.0000 mL | Freq: Once | INTRAVENOUS | Status: AC | PRN
  Administered 2024-09-24: 10 mL via INTRAVENOUS

## 2024-09-24 MED ORDER — ROCURONIUM BROMIDE 10 MG/ML (PF) SYRINGE
PREFILLED_SYRINGE | INTRAVENOUS | Status: DC | PRN
  Administered 2024-09-24: 100 mg via INTRAVENOUS
  Administered 2024-09-24: 20 mg via INTRAVENOUS

## 2024-09-24 MED ORDER — VANCOMYCIN HCL IN DEXTROSE 1-5 GM/200ML-% IV SOLN
1000.0000 mg | Freq: Once | INTRAVENOUS | Status: AC
  Administered 2024-09-24: 1000 mg via INTRAVENOUS
  Filled 2024-09-24: qty 200

## 2024-09-24 MED ORDER — LIDOCAINE 2% (20 MG/ML) 5 ML SYRINGE
INTRAMUSCULAR | Status: DC | PRN
  Administered 2024-09-24: 100 mg via INTRAVENOUS

## 2024-09-24 NOTE — Progress Notes (Signed)
 MRI reviewed.  This shows worsening discitis osteomyelitis at L4-5.  Epidural fluid collection consistent with epidural abscess on the bottom of L2-3 down to under the L5 lamina on the left at L4-5 on the right.  There is significant spinal stenosis at L3-4 and L4-5.  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  Recommended decompressive laminectomy L3-4 and L4-5.  We will culture the area.  He understands the risks of the surgery include but are not limited to bleeding, infection, CSF leak, nerve root injury, vascular injury, instability, need for further surgery, lack of relief of symptoms, worsening symptoms, numbness, weakness, loss of bowel bladder sexual function, and anesthesia risk including DVT pneumonia MI and death.  He understands risk benefits expected outcome and alternatives and wishes to proceed

## 2024-09-24 NOTE — Assessment & Plan Note (Signed)
 Due to above findings, continue as needed p.o. and IV analgesics -As needed muscle relaxants -Steroids if needed per neurosurgery

## 2024-09-24 NOTE — Sepsis Progress Note (Signed)
 Elink following code sepsis

## 2024-09-24 NOTE — ED Notes (Signed)
 Pt to MRI

## 2024-09-24 NOTE — ED Provider Notes (Signed)
 Blood pressure (!) 127/91, pulse 97, temperature 97.7 F (36.5 C), resp. rate 20, SpO2 100%.  Assuming care from Dr. Raford.  In short, Alexander Roberts is a 39 y.o. male with a chief complaint of Back Pain .  Refer to the original H&P for additional details.  The current plan of care is to follow NSG recommendations and admit to medicine service.   Discussed patient's case with [facility], Dr. Willette to request admission. Patient and family (if present) updated with plan.   I reviewed all nursing notes, vitals, pertinent old records, EKGs, labs, imaging (as available).  NSG to consult this AM w/ further recommendations.   Procedures    Eston Heslin, Fonda MATSU, MD 09/24/24 [phone]

## 2024-09-24 NOTE — Op Note (Signed)
 09/24/2024  3:18 PM  PATIENT:  Alexander Roberts  39 y.o. 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 severe spinal stenosis with early cauda equina syndrome and weakness in the left leg  POST-OPERATIVE DIAGNOSIS:  same  PROCEDURE: Decompressive lumbar laminectomy, medial facetectomy foraminotomies L3-4 and L4-5 with evacuation of epidural abscess  SURGEON:  Alm Molt, MD  ASSISTANTS: Meyran FNP  ANESTHESIA:   General  EBL: 200 ml  Total I/O In: 1350 [I.V.:1000; IV Piggyback:350] Out: 200 [Blood:200]  BLOOD ADMINISTERED: none  DRAINS: med hemovac  SPECIMEN:  none  INDICATION FOR PROCEDURE: This patient presented with severe back pain with numbness in his perineum and in his legs with weakness in his left leg. Imaging showed severe changes in the lumbar spine with evidence of osteomyelitis at the with severe stenosis L3-4 and L4-5. Recommended decompressive laminectomy with evacuation of the abscess. Patient understood the risks, benefits, and alternatives and potential outcomes and wished to proceed.  PROCEDURE DETAILS: The patient was taken to the operating room and after induction of adequate generalized endotracheal anesthesia, the patient was rolled into the prone position on the Wilson frame and all pressure points were padded. The lumbar region was cleaned with Betadine and then prepped with DuraPrep and draped in the usual sterile fashion. 5 cc of local anesthesia was injected and then a dorsal midline incision was made and carried down to the lumbo sacral fascia. The fascia was opened and the paraspinous musculature was taken down in a subperiosteal fashion to expose L3-4 and L4-5 bilaterally. Intraoperative x-ray confirmed my level, and then I leave the spinous processes of L3 and L4 and used a combination of the high-speed drill and the Kerrison punches to perform a hemilaminectomy, medial facetectomy, and  foraminotomy at L3-4 and L4-5 bilaterally. The underlying yellow ligament was opened and removed in a piecemeal fashion to expose the underlying dura and exiting nerve root L3-4 and L4-5 bilaterally.  The dura was very taut.  It did relax somewhat with a releasing the purulence from the ventral space. I undercut the lateral recess and dissected down until I was medial to and distal to the pedicle. The nerve root was well decompressed.  We were able to work ventrally with a coronary dilator and a nerve hook and ball probes to release a significant amount of ventral epidural pus at L3-4 and L4-5 on the left.  We worked distally distal to the L5 nerve root and superiorly to the L2-3 disc level with the ball probe and released pus.  We sent the purulence for culture.  I then palpated with a coronary dilator along the nerve root and into the foramen to assure adequate decompression. I felt no more compression of the nerve root. I irrigated with saline solution containing bacitracin . Achieved hemostasis with bipolar cautery, lined the dura with Gelfoam, placed a medium Hemovac drain through a separate stab incision, and then closed the fascia with 0 Vicryl. I closed the subcutaneous tissues with 2-0 Vicryl and the subcuticular tissues with 3-0 Vicryl. The skin was then closed with benzoin and Steri-Strips. The drapes were removed, a sterile dressing was applied.  My nurse practitioner was involved in the exposure, the decompression, the evacuation of the purulence, and the closure. the patient was awakened from general anesthesia and transferred to the recovery room in stable condition. At the end of the procedure all sponge, needle and instrument counts were correct.    PLAN OF  CARE: Admit to inpatient   PATIENT DISPOSITION:  PACU - hemodynamically stable.   Delay start of Pharmacological VTE agent (>24hrs) due to surgical blood loss or risk of bleeding:  yes

## 2024-09-24 NOTE — Progress Notes (Signed)
 ED Pharmacy Antibiotic Sign Off An antibiotic consult was received from an ED provider for Vancomycin /Cefepime  per pharmacy dosing for osteomyelitis. A chart review was completed to assess appropriateness.   The following one time order(s) were placed:  Vancomycin  2000 mg IV x 1 Cefepime  2g IV x 1  Further antibiotic and/or antibiotic pharmacy consults should be ordered by the admitting provider if indicated.   Lynwood Mckusick, PharmD, BCPS Clinical Pharmacist Phone: [phone]

## 2024-09-24 NOTE — Progress Notes (Signed)
 Pharmacy Antibiotic Note  Alexander Roberts is a 39 y.o. male admitted on 09/24/2024 with osteomyelitis and epidural abscess. Pharmacy has been consulted for vancomycin  dosing. Cr <1.  Plan: Vancomycin  1750mg  IV q8h - est AUC 508 Vancomycin  levels at Css    Temp (24hrs), Avg:98.6 F (37 C), Min:97.5 F (36.4 C), Max:100.5 F (38.1 C)  Recent Labs  Lab 09/24/24 0156 09/24/24 0751  WBC 13.5*  --   CREATININE 0.78 0.72  LATICACIDVEN 0.9  --     Estimated Creatinine Clearance: 192.5 mL/min (by C-G formula based on SCr of 0.72 mg/dL).    Allergies[1]    Ozell Jamaica, PharmD, BCPS, [facility] Clinical Pharmacist [phone] Please check AMION for all [facility] Pharmacy numbers 09/24/2024     [1] No Known Allergies

## 2024-09-24 NOTE — ED Notes (Signed)
 [facility] here to transport to [facility]

## 2024-09-24 NOTE — Anesthesia Procedure Notes (Signed)
 Procedure Name: Intubation Date/Time: 09/24/2024 1:24 PM  Performed by: Arvell Edsel HERO, CRNAPre-anesthesia Checklist: Patient identified, Emergency Drugs available, Suction available, Timeout performed and Patient being monitored Patient Re-evaluated:Patient Re-evaluated prior to induction Oxygen Delivery Method: Circle system utilized Preoxygenation: Pre-oxygenation with 100% oxygen Induction Type: IV induction Ventilation: Oral airway inserted - appropriate to patient size and Two handed mask ventilation required Laryngoscope Size: Glidescope and 4 Grade View: Grade I Tube size: 7.5 mm Number of attempts: 1 Airway Equipment and Method: Patient positioned with wedge pillow and Video-laryngoscopy Placement Confirmation: ETT inserted through vocal cords under direct vision, positive ETCO2 and breath sounds checked- equal and bilateral Secured at: 26 cm Tube secured with: Tape Dental Injury: Teeth and Oropharynx as per pre-operative assessment

## 2024-09-24 NOTE — Hospital Course (Addendum)
 Alexander Roberts is a 39 year old male with no significant past medical history With exception of chronic back pain presenting with worsening back pain over the past few days with worsening weakness in the left leg.  He has been having difficulty ambulating.  He is reporting numbness in the bilateral lower extremities. He denies any saddle numbness. Denies any urinary or fecal incontinence. He does state that he is now having weakness in his right leg as well which is what prompted him ED. Denies having any fever or chills.      ED Evaluation  Blood pressure (!) 127/91, pulse 97, temperature 97.7 F (36.5 C), resp. rate 20, SpO2 100%. LABs: WBC 13.5, neutrophils of 10.4, sed rate 55,   EDP Dr. Raford discussed the case with neurosurgery Dr. Luke Pean  Who will see the patient  MRI of the spine 09/20/2024 without contrast 1. Progressive, severe multifactorial spinal and neural foraminal stenosis at L4-5. Abnormal signal in the disc space and mild vertebral marrow edema may be degenerative as there is no frank endplate erosion; however, there is mild edema in the paraspinal soft tissues and discitis-osteomyelitis is a consideration in the appropriate clinical setting. Abnormal ventral epidural material at L4 and L5 could reflect venous engorgement versus epidural or phlegmon. 2. Progressive, severe spinal stenosis at L2-3 and L3-4. 3. Moderate spinal stenosis and severe neural foraminal stenosis at L5-S

## 2024-09-24 NOTE — ED Triage Notes (Signed)
 Pt presents via EMS c/o chronic lower back pain. Reports episode numbness in bilateral extremities. Reports has numbness in LLE at baseline. Also reports has been recently evaluated for possible infection of vertebrae. Reports 3rd visit to ED this week for lower back pain.

## 2024-09-24 NOTE — Anesthesia Preprocedure Evaluation (Signed)
"                                    Anesthesia Evaluation  Patient identified by MRN, date of birth, ID band Patient awake    Reviewed: Allergy & Precautions, NPO status , Patient's Chart, lab work & pertinent test results  History of Anesthesia Complications Negative for: history of anesthetic complications  Airway Mallampati: II  TM Distance: >3 FB Neck ROM: Full    Dental  (+) Teeth Intact, Dental Advisory Given   Pulmonary Current Smoker and Patient abstained from smoking.   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   Abdominal   Peds  Hematology   Anesthesia Other Findings   Reproductive/Obstetrics                              Anesthesia Physical Anesthesia Plan  ASA: 2 and emergent  Anesthesia Plan: General   Post-op Pain Management:    Induction: Intravenous  PONV Risk Score and Plan: 1 and Ondansetron , Dexamethasone  and Treatment may vary due to age or medical condition  Airway Management Planned: Oral ETT  Additional Equipment: None  Intra-op Plan:   Post-operative Plan: Extubation in OR  Informed Consent: I have reviewed the patients History and Physical, chart, labs and discussed the procedure including the risks, benefits and alternatives for the proposed anesthesia with the patient or authorized representative who has indicated his/her understanding and acceptance.     Dental advisory given  Plan Discussed with: CRNA  Anesthesia Plan Comments:          Anesthesia Quick Evaluation  "

## 2024-09-24 NOTE — ED Provider Notes (Signed)
 39 year old male transferred from [facility] because of concern for epidural abscess.  He is complaining of numbness in his left leg but is able to stand.  He has weakness of hip abduction but normal strength of plantarflexion and dorsiflexion.  Decreased sensation is noted over the posterior medial aspect of the left thigh and the entire left lower leg.  I have discussed the case with Luke Pean of the neurosurgery service who states that she will come to evaluate the patient.   Raford Lenis, MD 09/24/24 [phone]

## 2024-09-24 NOTE — ED Notes (Signed)
 Pt rolling in pain, pt reports 10/10 back pain, pt requests pain med, pain med given, ice packs given, warm blanket offered.

## 2024-09-24 NOTE — Transfer of Care (Signed)
 Immediate Anesthesia Transfer of Care Note  Patient: Alexander Roberts  Procedure(s) Performed: LUMBAR LAMINECTOMY/DECOMPRESSION MICRODISCECTOMY 1 LEVEL (Back)  Patient Location: PACU  Anesthesia Type:General  Level of Consciousness: awake, alert , oriented, and patient cooperative  Airway & Oxygen Therapy: Patient Spontanous Breathing and Patient connected to face mask oxygen  Post-op Assessment: Report given to RN, Post -op Vital signs reviewed and stable, Patient moving all extremities, and Patient moving all extremities X 4  Post vital signs: Reviewed and stable  Last Vitals:  Vitals Value Taken Time  BP 145/89 09/24/24 15:35  Temp 37.8 C 09/24/24 15:35  Pulse 107 09/24/24 15:43  Resp 34 09/24/24 15:43  SpO2 87 % 09/24/24 15:43  Vitals shown include unfiled device data.  Last Pain:  Vitals:   09/24/24 1253  TempSrc:   PainSc: 9          Complications: No notable events documented.

## 2024-09-24 NOTE — Care Management (Signed)
 Transition of Care [facility]) - Inpatient Brief Assessment   Patient Details  Name: Alexander Roberts MRN: [id number] Date of Birth: 1986-08-06  [facility] CM/SW Contact:    Corean JAYSON Canary, RN Phone Number: 09/24/2024, 10:17 AM   Clinical Narrative: 39 year old patient presents with ongoing back pain with some numbness, he has been seen as a outpatient and had a recent MRI. History of pain management last year, epidural injections, and De Quervains tenosynovitis of the wrist. Neurosurgery has seen the patient and will likely require decompression and lami- MRI suggestive of osteomyelitis and possible epidural abscess.  Has not seen PCP in over 2 years will likely require new PCP, DME and possible home health.    IPCM will follow for needs, recommendations, and transitions of care  Transition of Care Asessment: Insurance and Status: Insurance coverage has been reviewed Patient has primary care physician:  (Has not seen for over a year) Home environment has been reviewed: Spouse - [facility] Prior level of function:: Independent but weaker Prior/Current Home Services: No current home services Social Drivers of Health Review:  (not on file currently) Readmission risk has been reviewed: Yes Transition of care needs: transition of care needs identified, TOC will continue to follow

## 2024-09-24 NOTE — Consult Note (Signed)
 Reason for Consult: Severe back and leg pain Referring Physician: EDP  Alexander Roberts is an 39 y.o. male.   HPI:  39 year old gentleman who has about a 2-week history of severe back pain.  He saw Dr. Debby, one of my partners, this past week after 2 ER visits for severe back pain.  MRI was done as an outpatient was done without contrast and was suggestive of discitis and osteomyelitis.  He was sent for labs and seen in the office on Friday.  Yesterday he was walking up stairs in his leg on the left went numb.  They then developed numbness in his perineum and in his other leg.  He presented to the emergency department with those complaints.  He states he is having some weakness in the left leg.  His low back pain is severe.  He was in an outside ED and was given empiric antibiotics.  The hospitalist are admitting and they have already called infectious disease.  He denies recent infections other than the flu.  He denies IV drug use.  Past Medical History:  Diagnosis Date   Medical history non-contributory     Past Surgical History:  Procedure Laterality Date   EXCISION OF TONGUE LESION N/A 10/23/2019   Procedure: RESECTION OF THYROGLOSSAL REMNANT IN BASE OF TONGUE;  Surgeon: Jesus Oliphant, MD;  Location: [facility] OR;  Service: ENT;  Laterality: N/A;   NO PAST SURGERIES      Allergies[1]  Social History   Tobacco Use   Smoking status: Every Day    Current packs/day: 0.50    Types: Cigarettes   Smokeless tobacco: Never  Substance Use Topics   Alcohol use: Yes    Comment: occasional    History reviewed. No pertinent family history.   Review of Systems  Positive ROS: As above  All other systems have been reviewed and were otherwise negative with the exception of those mentioned in the HPI and as above.  Objective: Vital signs in last 24 hours: Temp:  [97.7 F (36.5 C)-97.9 F (36.6 C)] 97.7 F (36.5 C) (01/25 0339) Pulse Rate:  [95-99] 97 (01/25 0532) Resp:  [16-23] 20 (01/25  0532) BP: (127-158)/(78-117) 127/91 (01/25 0532) SpO2:  [99 %-100 %] 100 % (01/25 0532)  General Appearance: Alert, cooperative, no distress, appears stated age Head: Normocephalic, without obvious abnormality, atraumatic Eyes: PERRL, conjunctiva/corneas clear, EOM's intact     Throat: benign Neck: Supple, symmetrical, trachea midline Lungs: respirations unlabored Heart: Regular rate and rhythm Abdomen: Soft, non-tender Extremities: Extremities normal, atraumatic, no cyanosis or edema Pulses: 2+ and symmetric all extremities Skin: Skin color, texture, turgor normal, no rashes or lesions  NEUROLOGIC:   Mental status: A&O x4, no aphasia, good attention span, Memory and fund of knowledge appear to be appropriate Motor Exam - grossly normal, normal tone and bulk in the upper extremity but he has 2/5 strength in the left hip flexor and left dorsiflexor Sensory Exam -recently decreased in the lower extremities Reflexes: symmetric, no pathologic reflexes, No Hoffman's, No clonus Coordination - grossly normal Gait -not able to assess Balance -not able to assess Cranial Nerves: I: smell Not tested  II: visual acuity  OS: na  OD: na  II: visual fields Full to confrontation  II: pupils Equal, round, reactive to light  III,VII: ptosis None  III,IV,VI: extraocular muscles  Full ROM  V: mastication Normal  V: facial light touch sensation  Normal  V,VII: corneal reflex  Present  VII: facial muscle function - upper  Normal  VII: facial muscle function - lower Normal  VIII: hearing Not tested  IX: soft palate elevation  Normal  IX,X: gag reflex Present  XI: trapezius strength  5/5  XI: sternocleidomastoid strength 5/5  XI: neck flexion strength  5/5  XII: tongue strength  Normal    Data Review Lab Results  Component Value Date   WBC 13.5 (H) 09/24/2024   HGB 12.7 (L) 09/24/2024   HCT 36.9 (L) 09/24/2024   MCV 80.7 09/24/2024   PLT 330 09/24/2024   Lab Results  Component Value  Date   NA 135 09/24/2024   K 4.3 09/24/2024   CL 96 (L) 09/24/2024   CO2 27 09/24/2024   BUN 11 09/24/2024   CREATININE 0.78 09/24/2024   GLUCOSE 110 (H) 09/24/2024   Lab Results  Component Value Date   INR 1.0 09/24/2024    Radiology: No results found. MRI: I reviewed his MRI done as an outpatient a few days ago.  It was done without contrast.  There is significant stenosis at L3-4 L4-L5 and S1 that is suggestive of discitis with epidural abscess.  Assessment/Plan: Estimated body mass index is 39.96 kg/m as calculated from the following:   Height as of 09/17/24: 6' 3 (1.905 m).   Weight as of 09/17/24: 33 kg.   39 year old with acute onset of severe back pain and now acute onset of numbness and weakness in the groin, perineum and legs left more than right.  Stat MRI of lumbar spine with and without contrast Blood cultures Unfortunately he has already received antibiotics empirically and this may hurt our ability to get a pathogen He will likely need urgent or emergent decompressive laminectomy for evacuation of epidural abscess, so keep him n.p.o. for now.  Avoid anticoagulation.   Alm GORMAN Molt 09/24/2024 8:28 AM        [1] No Known Allergies

## 2024-09-24 NOTE — Progress Notes (Signed)
 PT Cancellation Note  Patient Details Name: Alexander Roberts MRN: [id number] DOB: [birth year]   Cancelled Treatment:    Reason Eval/Treat Not Completed: Other (comment) (Noted neurosurgery note for possible upcoming emergent or urgent decompressive lamiectomy. Will hold therapy until after surgery.)   Azani Brogdon 09/24/2024, 10:17 AM

## 2024-09-24 NOTE — Assessment & Plan Note (Signed)
 Ruling out osteomyelitis of L4-L5 -Continue current antibiotics -Pending neurosurgery evaluation -MRI from 09/20/24 was reviewed -Appreciate ID input

## 2024-09-24 NOTE — Anesthesia Postprocedure Evaluation (Signed)
"   Anesthesia Post Note  Patient: Alexander Roberts  Procedure(s) Performed: LUMBAR LAMINECTOMY/DECOMPRESSION MICRODISCECTOMY 1 LEVEL (Back)     Patient location during evaluation: PACU Anesthesia Type: General Level of consciousness: awake Pain management: pain level controlled Vital Signs Assessment: post-procedure vital signs reviewed and stable Respiratory status: spontaneous breathing Cardiovascular status: blood pressure returned to baseline Postop Assessment: no apparent nausea or vomiting and adequate PO intake Anesthetic complications: no   No notable events documented.  Last Vitals:  Vitals:   09/24/24 1800 09/24/24 1825  BP: (!) 158/89 (!) 168/90  Pulse: 89 90  Resp: 20 20  Temp:    SpO2: 98% 98%    Last Pain:  Vitals:   09/24/24 1825  TempSrc:   PainSc: 6                  Karriem Muench T Colhoun      "

## 2024-09-24 NOTE — Consult Note (Incomplete)
 "        [facility]    Date of Admission:  09/24/2024   Total days of inpatient antibiotics ***        Reason for Consult: ***    Principal Problem:   Osteomyelitis (HCC) Active Problems:   Discitis of lumbar region   Multilevel spinal stenosis   Tobacco use   Back pain   Assessment: 39 year old male with chronic back pain who has previously been seen by pain management at [facility], multiple epidural injection at L4-Ellis with 1 last one done on 11/26/2021 presents for worsening weakness in left leg.  He is having difficulty doing his ADLs.  Found to have #Possible L4-L5 osteomyelitis/discitis with phlegmon - On arrival temp afebrile, WBC 13.5 K.  MR lumbar spine without contrast showed  L4-5, mild vertebral marrow edema may be degenerative however paraspinal soft tissue present discitis/osteo is a consideration, abnormal ventral epidural material L4-L5 with slight venous engorgement versus epidural or phlegmon. - Patient was started on vancomycin  and cefepime  - ID engaged for antibiotic recommendations  Recommendations:  -Okay to continue antibiotics - Follow-up blood cultures - Neurosurgery consult, MRI with and without contrast as MNR - Standard precautions Microbiology:   Antibiotics: Vancomycin  and cefepime  1/24-present  Cultures: Blood 1/24 Urine  Other   HPI: Alexander Roberts is a 39 y.o. male with history of lumbar radiculopathy who has had flareup of back pain in the past previously relieved by L5-S1 epidural injection last 1 done on 11/26/2021 by pain medicine at [facility] presented worsening back pain over the past few days with worsening weakness in the left leg.  Patient was having difficulty ambulating and doing ADLs.  On arrival to ER WBC 13K.  Afebrile., MR lumbar spine without contrast has shown progressive severe multifocal spinal and neural foraminal stenosis at L4-5, mild vertebral marrow edema may be degenerative however paraspinal  soft tissue present discitis/osteo is a consideration, abnormal ventral epidural material L4-L5 with slight venous engorgement versus epidural or phlegmon.  Started on broad-spectrum antibiotics ID engaged antibiotic recommendations   Review of Systems: ROS  Past Medical History:  Diagnosis Date   Medical history non-contributory     Social History[1]  History reviewed. No pertinent family history. Scheduled Meds:  LORazepam   1 mg Intravenous Once   nicotine   14 mg Transdermal Daily   senna-docusate  1 tablet Oral BID   sodium chloride  flush  3 mL Intravenous Q12H   sodium chloride  flush  3 mL Intravenous Q12H   Continuous Infusions:  ceFEPime  (MAXIPIME ) IV 2 g (09/24/24 1015)   lactated ringers  150 mL/hr at 09/24/24 1019   PRN Meds:.acetaminophen  **OR** acetaminophen , bisacodyl , hydrALAZINE , HYDROmorphone  (DILAUDID ) injection, ipratropium, methocarbamol , ondansetron  **OR** ondansetron  (ZOFRAN ) IV, oxyCODONE , zolpidem  Allergies[2]  OBJECTIVE: Blood pressure (!) 155/90, pulse (!) 107, temperature 97.9 F (36.6 C), temperature source Oral, resp. rate 18, SpO2 100%.  Physical Exam  Lab Results Lab Results  Component Value Date   WBC 13.5 (H) 09/24/2024   HGB 12.7 (L) 09/24/2024   HCT 36.9 (L) 09/24/2024   MCV 80.7 09/24/2024   PLT 330 09/24/2024    Lab Results  Component Value Date   CREATININE 0.72 09/24/2024   BUN 11 09/24/2024   NA 135 09/24/2024   K 4.3 09/24/2024   CL 96 (L) 09/24/2024   CO2 27 09/24/2024    Lab Results  Component Value Date   ALT 30 09/24/2024   AST 17 09/24/2024   ALKPHOS 106 09/24/2024  BILITOT 0.6 09/24/2024       Loney Stank, MD [facility] 09/24/2024, 10:53 AM      [1]  Social History Tobacco Use   Smoking status: Every Day    Current packs/day: 0.50    Types: Cigarettes   Smokeless tobacco: Never  Vaping Use   Vaping status: Never Used  Substance Use Topics    Alcohol use: Yes    Comment: occasional   Drug use: No  [2] No Known Allergies  "

## 2024-09-24 NOTE — Assessment & Plan Note (Signed)
 Counseled regarding smoking cessation -Accepted NicoDerm patch will be provided

## 2024-09-24 NOTE — Assessment & Plan Note (Signed)
 Acute on chronic back pain with a chronic history of multilevel spinal stenosis -with some inflammation - With new onset weakness and paresthesia  MRI reviewed in detail 09/20/24 -Appreciate neurosurgery evaluation and recommendations -Consider adding Decadron 

## 2024-09-24 NOTE — ED Provider Notes (Addendum)
 " [place] [facility] Provider Note   CSN: [id number] Arrival date & time: 09/24/24  0116     Patient presents with: Back Pain   Alexander Roberts is a 39 y.o. male.    Back Pain Associated symptoms: weakness      39 year old male presenting to the emergency department with concern for back pain and progressive weakness and numbness.  The patient states that he had an outpatient MRI which showed evidence of infection in the spine and he was advised to present to the emergency department for further evaluation.  This is third visit to the emergency department, he was recently seen on 1/16 and then subsequently on 09/17/2024.  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  Abnormal signal in the disc space and mild vertebral marrow edema may be degenerative as there is no frank endplate erosion; however, there is mild edema in the paraspinal soft tissues and discitis-osteomyelitis is a consideration in the appropriate clinical setting.  Abnormal ventral epidural material at L4 and L5 could reflect venous engorgement versus epidural or phlegmon.  Unfortunately, these details were not communicated with EMS and the patient was transported to this emergency department as it was the closest emergency department due to weather.  The patient endorses worsening back pain over the past few days with worsening weakness in the left leg.  He has been having difficulty ambulating.  He endorses numbness in the bilateral lower extremities.  He denies any saddle numbness, denies any urinary or fecal incontinence. He does state that he is now having weakness in his right leg as well which is what prompted him to call EMS.  Prior to Admission medications  Medication Sig Start Date End Date Taking? Authorizing Provider  acetaminophen  (TYLENOL ) 500 MG tablet Take 1,000-1,500 mg by mouth every 6 (six)  hours as needed for moderate pain (pain score 4-6) or mild pain (pain score 1-3).   Yes [provider]  celecoxib  (CELEBREX ) 200 MG capsule Take 1 capsule (200 mg total) by mouth 2 (two) times daily. 09/15/24  Yes Jerral Meth, MD  Ibuprofen -Acetaminophen  (ADVIL  DUAL ACTION) 125-250 MG TABS Take 3 tablets by mouth every 6 (six) hours as needed (pain).   Yes [provider]  Ibuprofen -diphenhydrAMINE Cit (ADVIL  PM) 200-38 MG TABS Take 4 tablets by mouth at bedtime as needed (pain).   Yes [provider]  methocarbamol  (ROBAXIN ) 750 MG tablet Take 750 mg by mouth 3 (three) times daily. 09/22/24  Yes [provider]  oxyCODONE -acetaminophen  (PERCOCET) 10-325 MG tablet Take 1-2 tablets by mouth every 6 (six) hours as needed. 09/20/24  Yes [provider]  pregabalin (LYRICA) 75 MG capsule Take 75 mg by mouth 2 (two) times daily. 09/20/24  Yes [provider]  cyclobenzaprine  (FLEXERIL ) 10 MG tablet Take 1 tablet (10 mg total) by mouth 2 (two) times daily as needed for muscle spasms. Patient not taking: Reported on 09/24/2024 09/15/24   Jerral Meth, MD  diazepam (VALIUM) 5 MG tablet Take 5-10 mg by mouth once. Patient not taking: Reported on 09/24/2024 09/20/24   [provider]  lidocaine  (LIDODERM ) 5 % Place 1 patch onto the skin daily. Remove & Discard patch within 12 hours or as directed by MD Patient not taking: Reported on 09/24/2024 09/15/24   Jerral Meth, MD  predniSONE  (STERAPRED UNI-PAK 21 TAB) 10 MG (21) TBPK tablet Take by mouth daily. Take 6  tabs by mouth daily  for 2 days, then 5 tabs for 2 days, then 4 tabs for 2 days, then 3 tabs for 2 days, 2 tabs for 2 days, then 1 tab by mouth daily for 2 days Patient not taking: Reported on 09/24/2024 09/15/24   Jerral Meth, MD  promethazine  (PHENERGAN ) 25 MG suppository Place 1 suppository (25 mg total) rectally every 6 (six) hours as needed for nausea or vomiting. 10/24/19    Jesus Oliphant, MD    Allergies: Patient has no known allergies.    Review of Systems  Musculoskeletal:  Positive for back pain.  Neurological:  Positive for weakness.  All other systems reviewed and are negative.   Updated Vital Signs BP (!) 140/83 (BP Location: Right Arm)   Pulse (!) 116   Temp (!) 100.5 F (38.1 C) (Oral)   Resp (!) 22   SpO2 100%   Physical Exam Vitals and nursing note reviewed.  Constitutional:      General: He is not in acute distress.    Appearance: He is well-developed.  HENT:     Head: Normocephalic and atraumatic.  Eyes:     Conjunctiva/sclera: Conjunctivae normal.  Cardiovascular:     Rate and Rhythm: Normal rate and regular rhythm.  Pulmonary:     Effort: Pulmonary effort is normal. No respiratory distress.     Breath sounds: Normal breath sounds.  Abdominal:     Palpations: Abdomen is soft.     Tenderness: There is no abdominal tenderness.  Musculoskeletal:        General: No swelling.     Cervical back: Neck supple.  Skin:    General: Skin is warm and dry.     Capillary Refill: Capillary refill takes less than 2 seconds.  Neurological:     Mental Status: He is alert.     Comments: 5/5 strength in the bilateral upper extremities, 4/5 strength in the LLE, 4.5/5 strength in the RLE. Intact sensation to light touch, diminished in the lower extremities  Psychiatric:        Mood and Affect: Mood normal.     (all labs ordered are listed, but only abnormal results are displayed) Labs Reviewed  COMPREHENSIVE METABOLIC PANEL WITH GFR - Abnormal; Notable for the following components:      Result Value   Chloride 96 (*)    Glucose, Bld 110 (*)    All other components within normal limits  CBC WITH DIFFERENTIAL/PLATELET - Abnormal; Notable for the following components:   WBC 13.5 (*)    Hemoglobin 12.7 (*)    HCT 36.9 (*)    Neutro Abs 10.4 (*)    Monocytes Absolute 1.2 (*)    Abs Immature Granulocytes 0.28 (*)    All other components  within normal limits  SEDIMENTATION RATE - Abnormal; Notable for the following components:   Sed Rate 55 (*)    All other components within normal limits  CULTURE, BLOOD (ROUTINE X 2)  CULTURE, BLOOD (ROUTINE X 2)  EXPECTORATED SPUTUM ASSESSMENT W GRAM STAIN, RFLX TO RESP C  SURGICAL PCR SCREEN  LACTIC ACID, PLASMA  PROTIME-INR  PROCALCITONIN  CREATININE, SERUM  HIV ANTIBODY (ROUTINE TESTING W REFLEX)  MAGNESIUM  PHOSPHORUS  C-REACTIVE PROTEIN  URINE DRUG SCREEN  CBG MONITORING, ED    EKG: EKG Interpretation Date/Time:  Sunday September 24 2024 02:10:07 EST Ventricular Rate:  94 PR Interval:  150 QRS Duration:  86 QT Interval:  333 QTC Calculation: 417 R Axis:  36  Text Interpretation: Sinus rhythm Borderline T wave abnormalities Confirmed by Jerrol Agent ([id number]) on 09/24/2024 2:11:50 AM  Radiology: No results found.   .Critical Care  Performed by: Jerrol Agent, MD Authorized by: Jerrol Agent, MD   Critical care provider statement:    Critical care time (minutes):  90   Critical care was necessary to treat or prevent imminent or life-threatening deterioration of the following conditions:  Sepsis   Critical care was time spent personally by me on the following activities:  Development of treatment plan with patient or surrogate, discussions with consultants, evaluation of patient's response to treatment, examination of patient, ordering and review of laboratory studies, ordering and review of radiographic studies, ordering and performing treatments and interventions, pulse oximetry, re-evaluation of patient's condition and review of old charts   Care discussed with: accepting provider at another facility      Medications Ordered in the ED  lactated ringers  infusion ( Intravenous New Bag/Given 09/24/24 1019)  sodium chloride  flush (NS) 0.9 % injection 3 mL ( Intravenous Automatically Held 10/02/24 2200)  sodium chloride  flush (NS) 0.9 % injection 3 mL ( Intravenous  Automatically Held 10/02/24 2200)  acetaminophen  (TYLENOL ) tablet 650 mg ( Oral MAR Hold 09/24/24 1230)    Or  acetaminophen  (TYLENOL ) suppository 650 mg ( Rectal MAR Hold 09/24/24 1230)  oxyCODONE  (Oxy IR/ROXICODONE ) immediate release tablet 5 mg ( Oral MAR Hold 09/24/24 1230)  HYDROmorphone  (DILAUDID ) injection 0.5-1 mg ( Intravenous MAR Hold 09/24/24 1230)  zolpidem  (AMBIEN ) tablet 5 mg ( Oral MAR Hold 09/24/24 1230)  bisacodyl  (DULCOLAX) EC tablet 5 mg ( Oral MAR Hold 09/24/24 1230)  ondansetron  (ZOFRAN ) tablet 4 mg ( Oral MAR Hold 09/24/24 1230)    Or  ondansetron  (ZOFRAN ) injection 4 mg ( Intravenous MAR Hold 09/24/24 1230)  ipratropium (ATROVENT ) nebulizer solution 0.5 mg ( Nebulization MAR Hold 09/24/24 1230)  hydrALAZINE  (APRESOLINE ) injection 10 mg ( Intravenous MAR Hold 09/24/24 1230)  senna-docusate (Senokot-S) tablet 1 tablet ( Oral Automatically Held 10/02/24 2200)  ceFEPIme  (MAXIPIME ) 2 g in sodium chloride  0.9 % 100 mL IVPB ( Intravenous Automatically Held 10/02/24 1800)  nicotine  (NICODERM CQ  - dosed in mg/24 hours) patch 14 mg ( Transdermal Automatically Held 10/02/24 1000)  methocarbamol  (ROBAXIN ) tablet 500 mg ( Oral MAR Hold 09/24/24 1230)  lactated ringers  infusion ( Intravenous New Bag/Given 09/24/24 1305)  lactated ringers  infusion (has no administration in time range)  chlorhexidine  (PERIDEX ) 0.12 % solution 15 mL (has no administration in time range)    Or  Oral care mouth rinse (has no administration in time range)  0.9 % irrigation (POUR BTL) (1,000 mLs Irrigation Given 09/24/24 1352)  vancomycin  (VANCOCIN ) 1-5 GM/200ML-% IVPB (has no administration in time range)  Surgifoam 1 Gm with Thrombin  5,000 units (5 ml) topical solution ( Topical Given 09/24/24 1357)  ceFEPIme  (MAXIPIME ) 2 g in sodium chloride  0.9 % 100 mL IVPB (0 g Intravenous Stopped 09/24/24 0229)  vancomycin  (VANCOCIN ) IVPB 1000 mg/200 mL premix (0 mg Intravenous Stopped 09/24/24 0333)    Followed by  vancomycin   (VANCOCIN ) IVPB 1000 mg/200 mL premix (0 mg Intravenous Stopped 09/24/24 0445)  lactated ringers  bolus 1,000 mL (0 mLs Intravenous Stopped 09/24/24 0232)  HYDROmorphone  (DILAUDID ) injection 1 mg (1 mg Intravenous Given 09/24/24 0150)  HYDROmorphone  (DILAUDID ) injection 1 mg (1 mg Intravenous Given 09/24/24 0349)  ondansetron  (ZOFRAN ) injection 4 mg (4 mg Intravenous Given 09/24/24 0357)  LORazepam  (ATIVAN ) injection 1 mg (1 mg Intravenous Given 09/24/24 1055)  chlorhexidine  (  PERIDEX ) 0.12 % solution 15 mL (15 mLs Mouth/Throat Given 09/24/24 1304)    Or  Oral care mouth rinse ( Mouth Rinse See Alternative 09/24/24 1304)    Clinical Course as of 09/24/24 1412  Sun Sep 24, 2024  0208 WBC(!): 13.5 [JL]  0208 Pulse Rate: 99 [JL]    Clinical Course User Index [JL] Jerrol Agent, MD                                 Medical Decision Making Amount and/or Complexity of Data Reviewed Labs: ordered. Decision-making details documented in ED Course.  Risk Prescription drug management. Decision regarding hospitalization.     39 year old male presenting to the emergency department with concern for back pain and progressive weakness and numbness.  The patient states that he had an outpatient MRI which showed evidence of infection in the spine and he was advised to present to the emergency department for further evaluation.  This is third visit to the emergency department, he was recently seen on 1/16 and then subsequently on 09/17/2024.  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  Abnormal signal in the disc space and mild vertebral marrow edema may be degenerative as there is no frank endplate erosion; however, there is mild edema in the paraspinal soft tissues and discitis-osteomyelitis is a consideration in the appropriate clinical setting.  Abnormal ventral epidural material at L4 and L5 could reflect venous engorgement versus  epidural or phlegmon.  Unfortunately, these details were not communicated with EMS and the patient was transported to this emergency department as it was the closest emergency department due to weather.  The patient endorses worsening back pain over the past few days with worsening weakness in the left leg.  He has been having difficulty ambulating.  He endorses numbness in the bilateral lower extremities.  He denies any saddle numbness, denies any urinary or fecal incontinence.  He does state that he is now having weakness in his right leg as well which is what prompted him to call EMS.  On arrival, the patient was afebrile, temperature 97.9, borderline tachycardic heart rate 99, respiratory rate 20, BP 158/93, saturating her percent on room air.  On exam the patient had bilateral lower extremity weakness (left lower extremity weakness greater than right lower extremity).  With MRI findings, concern for epidural abscess.  Neurosurgery was consulted immediately on patient arrival.  IV access was obtained and IV vancomycin  and IV cefepime  in addition to an IV fluid bolus and IV Dilaudid  was ordered.  Laboratory evaluation was obtained including blood cultures.  Neurosurgery: Spoke with Luke Pean of on-call neurosurgery who reviewed the patient's presentation and agreed with ER to ER transfer for neurosurgical evaluation.  Patient will need admission for further management. She stated to wait on repeat imaging until the patient could be evaluated by neurosurgery. Discussed with both Dr. Griselda and Dr. Raford at [facility] and the patient was accepted in ER to ER transfer.  [facility] contacted for transport.  Labs: CBC with a leukocytosis of 13.5 and in the setting of elevated heart rate to 99, code sepsis was called.  Lactic acid was normal at 0.9, ESR and CRP collected and pending, CMP generally unremarkable, mild anemia to 12.7 noted, urinalysis ordered and pending, blood cultures x 2 collected and  pending.  [facility] arrived after 90 minutes in the ER  and pt subsequently transported to [facility] in critical but stable condition.     Final diagnoses:  Low back pain, unspecified back pain laterality, unspecified chronicity, unspecified whether sciatica present  Weakness  Epidural abscess    ED Discharge Orders     None          Jerrol Agent, MD 09/24/24 9750    Jerrol Agent, MD 09/24/24 9667    Jerrol Agent, MD 09/24/24 [id number]    Jerrol Agent, MD 09/24/24 1412  "

## 2024-09-24 NOTE — H&P (Signed)
 " History and Physical   Patient: Alexander Roberts                            PCP: Boneta, [place]  E, PA-C                    DOB: [birth year]            DOA: 09/24/2024 FMW:[id number]             DOS: 09/24/2024, 8:40 AM  Fulbright, [place]  E, PA-C  Patient coming from:   HOME  I have personally reviewed patient's medical records, in electronic medical records, including:  [facility], and care everywhere.    Chief Complaint:   Chief Complaint  Patient presents with   Back Pain    History of present illness:    Alexander Roberts is a 39 year old male with no significant past medical history With exception of chronic back pain presenting with worsening back pain over the past few days with worsening weakness in the left leg.  He has been having difficulty ambulating.  He is reporting numbness in the bilateral lower extremities. He denies any saddle numbness. Denies any urinary or fecal incontinence. He does state that he is now having weakness in his right leg as well which is what prompted him ED. Denies having any fever or chills.      ED Evaluation  Blood pressure (!) 127/91, pulse 97, temperature 97.7 F (36.5 C), resp. rate 20, SpO2 100%. LABs: WBC 13.5, neutrophils of 10.4, sed rate 55,   EDP Dr. Raford discussed the case with neurosurgery Dr. Luke Pean  Who will see the patient  MRI of the spine 09/20/2024 without contrast 1. Progressive, severe multifactorial spinal and neural foraminal stenosis at L4-5. Abnormal signal in the disc space and mild vertebral marrow edema may be degenerative as there is no frank endplate erosion; however, there is mild edema in the paraspinal soft tissues and discitis-osteomyelitis is a consideration in the appropriate clinical setting. Abnormal ventral epidural material at L4 and L5 could reflect venous engorgement versus epidural or phlegmon. 2. Progressive, severe spinal stenosis at L2-3 and L3-4. 3. Moderate spinal stenosis  and severe neural foraminal stenosis at L5-S  Patient Denies having: Fever, Chills, Cough, SOB, Chest Pain, Abd pain, N/V/D, headache, dizziness, lightheadedness,  Dysuria, Joint pain, rash, open wounds   Review of Systems: As per HPI, otherwise 10 point review of systems were negative.   ----------------------------------------------------------------------------------------------------------------------  Allergies[1]  Home MEDs:  Prior to Admission medications  Medication Sig Start Date End Date Taking? Authorizing Provider  celecoxib  (CELEBREX ) 200 MG capsule Take 1 capsule (200 mg total) by mouth 2 (two) times daily. 09/15/24   Jerral Meth, MD  clindamycin  (CLEOCIN ) 300 MG capsule Take 1 capsule (300 mg total) by mouth 3 (three) times daily. 10/24/19   Jesus Oliphant, MD  clindamycin  (CLEOCIN ) 300 MG capsule Take 1 capsule (300 mg total) by mouth 3 (three) times daily. 10/24/19   Jesus Oliphant, MD  cyclobenzaprine  (FLEXERIL ) 10 MG tablet Take 1 tablet (10 mg total) by mouth 2 (two) times daily as needed for muscle spasms. 09/15/24   Jerral Meth, MD  gabapentin  (NEURONTIN ) 300 MG capsule Take 300 mg by mouth 2 (two) times daily. 10/09/19   [provider]  HYDROcodone -acetaminophen  (HYCET) 7.5-325 mg/15 ml solution Take 15 mLs by mouth 4 (four) times daily as needed for moderate pain. 10/24/19   Jesus Oliphant,  MD  ibuprofen  (ADVIL ) 200 MG tablet Take 400-600 mg by mouth every 8 (eight) hours as needed (pain.).    [provider]  lidocaine  (LIDODERM ) 5 % Place 1 patch onto the skin daily. Remove & Discard patch within 12 hours or as directed by MD 09/15/24   Jerral Meth, MD  meloxicam  (MOBIC ) 7.5 MG tablet Take 7.5 mg by mouth daily. 10/07/19   [provider]  Multiple Vitamin (MULTIVITAMIN WITH MINERALS) TABS tablet Take 1 tablet by mouth daily. Centrum Multivitamin    [provider]  oxyCODONE -acetaminophen  (PERCOCET/ROXICET) 5-325 MG tablet Take  1 tablet by mouth every 6 (six) hours as needed for severe pain (pain score 7-10). 09/17/24   Theotis Peers M, PA-C  predniSONE  (STERAPRED UNI-PAK 21 TAB) 10 MG (21) TBPK tablet Take by mouth daily. Take 6 tabs by mouth daily  for 2 days, then 5 tabs for 2 days, then 4 tabs for 2 days, then 3 tabs for 2 days, 2 tabs for 2 days, then 1 tab by mouth daily for 2 days 09/15/24   Jerral Meth, MD  promethazine  (PHENERGAN ) 25 MG suppository Place 1 suppository (25 mg total) rectally every 6 (six) hours as needed for nausea or vomiting. 10/24/19   Jesus Oliphant, MD    PRN MEDs: acetaminophen  **OR** acetaminophen , bisacodyl , hydrALAZINE , HYDROmorphone  (DILAUDID ) injection, ipratropium, ondansetron  **OR** ondansetron  (ZOFRAN ) IV, oxyCODONE , zolpidem   Past Medical History:  Diagnosis Date   Medical history non-contributory     Past Surgical History:  Procedure Laterality Date   EXCISION OF TONGUE LESION N/A 10/23/2019   Procedure: RESECTION OF THYROGLOSSAL REMNANT IN BASE OF TONGUE;  Surgeon: Jesus Oliphant, MD;  Location: [facility] OR;  Service: ENT;  Laterality: N/A;   NO PAST SURGERIES       reports that he has been smoking cigarettes. He has never used smokeless tobacco. He reports current alcohol use. He reports that he does not use drugs.   History reviewed. No pertinent family history.  Physical Exam:   Vitals:   09/24/24 0500 09/24/24 0515 09/24/24 0532 09/24/24 0839  BP: 138/83 (!) 140/79 (!) 127/91 (!) 155/90  Pulse:   97 (!) 107  Resp: 16 (!) 23 20 18   Temp:    97.9 F (36.6 C)  TempSrc:    Oral  SpO2:   100% 100%   Constitutional: NAD, calm, comfortable Eyes: PERRL, lids and conjunctivae normal ENMT: Mucous membranes are moist. Posterior pharynx clear of any exudate or lesions.Normal dentition.  Neck: normal, supple, no masses, no thyromegaly Respiratory: clear to auscultation bilaterally, no wheezing, no crackles. Normal respiratory effort. No accessory muscle use.   Cardiovascular: Regular rate and rhythm, no murmurs / rubs / gallops. No extremity edema. 2+ pedal pulses. No carotid bruits.  Abdomen: no tenderness, no masses palpated. No hepatosplenomegaly. Bowel sounds positive.  Musculoskeletal: no clubbing / cyanosis. No joint deformity upper and lower extremities. Good ROM, no contractures. Normal muscle tone.  Neurologic: Subjective paresthesia to bilateral lower extremities, pelvic, perennial paresthesia Weakness left lower extremity> right, abnormal gait.  Due to weakness and paresthesia CN II-XII grossly intact.  Upper extremities sensory and motor intact  Psychiatric: Normal judgment and insight. Alert and oriented x 3. Normal mood.  Skin: no rashes, lesions, ulcers. No induration  Labs on admission:    I have personally reviewed following labs and imaging studies  CBC: Recent Labs  Lab 09/24/24 0156  WBC 13.5*  NEUTROABS 10.4*  HGB 12.7*  HCT 36.9*  MCV 80.7  PLT 330   Basic Metabolic Panel: Recent Labs  Lab 09/24/24 0156  NA 135  K 4.3  CL 96*  CO2 27  GLUCOSE 110*  BUN 11  CREATININE 0.78  CALCIUM  10.2   GFR: Estimated Creatinine Clearance: 192.5 mL/min (by C-G formula based on SCr of 0.78 mg/dL). Liver Function Tests: Recent Labs  Lab 09/24/24 0156  AST 17  ALT 30  ALKPHOS 106  BILITOT 0.6  PROT 7.3  ALBUMIN 3.7   No results for input(s): LIPASE, AMYLASE in the last 168 hours. No results for input(s): AMMONIA in the last 168 hours. Coagulation Profile: Recent Labs  Lab 09/24/24 0156  INR 1.0    Urine analysis:    Component Value Date/Time   COLORURINE YELLOW 06/19/2012 1333   APPEARANCEUR CLEAR 06/19/2012 1333   LABSPEC 1.026 06/19/2012 1333   PHURINE 7.5 06/19/2012 1333   GLUCOSEU NEGATIVE 06/19/2012 1333   HGBUR NEGATIVE 06/19/2012 1333   BILIRUBINUR NEGATIVE 06/19/2012 1333   KETONESUR NEGATIVE 06/19/2012 1333   PROTEINUR NEGATIVE 06/19/2012 1333   UROBILINOGEN 1.0 06/19/2012 1333    NITRITE NEGATIVE 06/19/2012 1333   LEUKOCYTESUR NEGATIVE 06/19/2012 1333    Last A1C:  No results found for: HGBA1C   Radiologic Exams on Admission:   No results found.  EKG:   Independently reviewed.  Orders placed or performed during the hospital encounter of 09/24/24   ED EKG   ED EKG   EKG 12-Lead   EKG 12-Lead   EKG   EKG   EKG 12-Lead   ---------------------------------------------------------------------------------------------------------------------------------------    Assessment / Plan:   Principal Problem:   Osteomyelitis (HCC) Active Problems:   Discitis of lumbar region   Back pain   Multilevel spinal stenosis   Tobacco use   Assessment and Plan: * Osteomyelitis (HCC) Ruling out osteomyelitis of L4-L5 -Continue current antibiotics -Pending neurosurgery evaluation -MRI from 09/20/24 was reviewed -Appreciate ID input  Discitis of lumbar region L4-5 discitis -versus epidural infection abscess phlegmon MRI of the spine was reviewed: Mild edema in the paraspinal soft tissues and discitis-osteomyelitis is a consideration in the appropriate clinical setting. Abnormal ventral epidural material at L4 and L5 could reflect venous engorgement versus epidural or phlegmon.  -Afebrile, normotensive, with WBC of 13.5, neutrophils 10.4 -Was started on vancomycin  and cefepime  -will continue for now -Will follow the blood cultures - Appreciate ID consultation follow-up and recommendations  - Neurosurgery consult by EDP, appreciate evaluation recommendation  Back pain Due to above findings, continue as needed p.o. and IV analgesics -As needed muscle relaxants -Steroids if needed per neurosurgery  Tobacco use Counseled regarding smoking cessation -Accepted NicoDerm patch will be provided  Multilevel spinal stenosis Acute on chronic back pain with a chronic history of multilevel spinal stenosis -with some inflammation - With new onset weakness and  paresthesia  MRI reviewed in detail 09/20/24 -Appreciate neurosurgery evaluation and recommendations -Consider adding Decadron           Consults called: Neurosurgery / ID -------------------------------------------------------------------------------------------------------------------------------------------- DVT prophylaxis:  Place and maintain sequential compression device Start: 09/24/24 0835 Place TED hose Start: 09/24/24 0835 SCDs Start: 09/24/24 0751   Code Status:   Code Status: Full Code   Admission status: Patient will be admitted as inpatient Level of care: Med-Surg   Family Communication: Wife at bedside (The above findings and plan of care has been discussed with patient in detail, the patient expressed understanding and agreement of above plan)  --------------------------------------------------------------------------------------------------------------------------------------------------  Disposition Plan: >3 days Status is: Inpatient The  patient will require care spanning > 2 midnights and should be moved to inpatient because: Will need neurosurgery evaluation possible intervention, IV antibiotics     ----------------------------------------------------------------------------------------------------------------------------------------------------  Time spent:  36  Min.  Was spent seeing and evaluating the patient, reviewing all medical records, drawn plan of care.  SIGNED: Adriana DELENA Grams, MD, FHM. FAAFP. [place] [facility], Pager  (Please use [email or web address] to page/ or secure chat through epic) If 7PM-7AM, please contact night-coverage [email or web address],  09/24/2024, 8:40 AM     [1] No Known Allergies  "

## 2024-09-24 NOTE — Assessment & Plan Note (Signed)
 L4-5 discitis -versus epidural infection abscess phlegmon MRI of the spine was reviewed: Mild edema in the paraspinal soft tissues and discitis-osteomyelitis is a consideration in the appropriate clinical setting. Abnormal ventral epidural material at L4 and L5 could reflect venous engorgement versus epidural or phlegmon.  -Afebrile, normotensive, with WBC of 13.5, neutrophils 10.4 -Was started on vancomycin  and cefepime  -will continue for now -Will follow the blood cultures - Appreciate ID consultation follow-up and recommendations  - Neurosurgery consult by EDP, appreciate evaluation recommendation

## 2024-09-25 ENCOUNTER — Encounter (HOSPITAL_COMMUNITY): Payer: Self-pay | Admitting: Neurological Surgery

## 2024-09-25 DIAGNOSIS — B9689 Other specified bacterial agents as the cause of diseases classified elsewhere: Secondary | ICD-10-CM

## 2024-09-25 DIAGNOSIS — M869 Osteomyelitis, unspecified: Secondary | ICD-10-CM | POA: Diagnosis not present

## 2024-09-25 DIAGNOSIS — F17210 Nicotine dependence, cigarettes, uncomplicated: Secondary | ICD-10-CM | POA: Diagnosis not present

## 2024-09-25 DIAGNOSIS — M4626 Osteomyelitis of vertebra, lumbar region: Secondary | ICD-10-CM | POA: Diagnosis not present

## 2024-09-25 DIAGNOSIS — G062 Extradural and subdural abscess, unspecified: Secondary | ICD-10-CM | POA: Diagnosis not present

## 2024-09-25 DIAGNOSIS — M4646 Discitis, unspecified, lumbar region: Secondary | ICD-10-CM | POA: Diagnosis not present

## 2024-09-25 DIAGNOSIS — M48061 Spinal stenosis, lumbar region without neurogenic claudication: Secondary | ICD-10-CM

## 2024-09-25 LAB — BLOOD CULTURE ID PANEL (REFLEXED) - BCID2

## 2024-09-25 LAB — GLUCOSE, CAPILLARY
Glucose-Capillary: 101 mg/dL — ABNORMAL HIGH (ref 70–99)
Glucose-Capillary: 132 mg/dL — ABNORMAL HIGH (ref 70–99)
Glucose-Capillary: 101 mg/dL — ABNORMAL HIGH (ref 70–99)

## 2024-09-25 LAB — BASIC METABOLIC PANEL WITH GFR
Anion gap: 12 (ref 5–15)
BUN: 13 mg/dL (ref 6–20)
CO2: 26 mmol/L (ref 22–32)
Calcium: 9.6 mg/dL (ref 8.9–10.3)
Chloride: 93 mmol/L — ABNORMAL LOW (ref 98–111)
Creatinine, Ser: 0.76 mg/dL (ref 0.61–1.24)
GFR, Estimated: >60 mL/min
Glucose, Bld: 117 mg/dL — ABNORMAL HIGH (ref 70–99)
Potassium: 4.3 mmol/L (ref 3.5–5.1)
Sodium: 131 mmol/L — ABNORMAL LOW (ref 135–145)

## 2024-09-25 LAB — PROTIME-INR
INR: 1.1 (ref 0.8–1.2)
Prothrombin Time: 14.9 s (ref 11.4–15.2)

## 2024-09-25 LAB — CBC WITH DIFFERENTIAL/PLATELET
Abs Immature Granulocytes: 0.14 10*3/uL — ABNORMAL HIGH (ref 0.00–0.07)
Basophils Absolute: 0 10*3/uL (ref 0.0–0.1)
Basophils Relative: 0 %
Eosinophils Absolute: 0 10*3/uL (ref 0.0–0.5)
Eosinophils Relative: 0 %
HCT: 32.6 % — ABNORMAL LOW (ref 39.0–52.0)
Hemoglobin: 11.5 g/dL — ABNORMAL LOW (ref 13.0–17.0)
Immature Granulocytes: 1 %
Lymphocytes Relative: 6 %
Lymphs Abs: 1 10*3/uL (ref 0.7–4.0)
MCH: 27.9 pg (ref 26.0–34.0)
MCHC: 35.3 g/dL (ref 30.0–36.0)
MCV: 79.1 fL — ABNORMAL LOW (ref 80.0–100.0)
Monocytes Absolute: 0.9 10*3/uL (ref 0.1–1.0)
Monocytes Relative: 6 %
Neutro Abs: 13.5 10*3/uL — ABNORMAL HIGH (ref 1.7–7.7)
Neutrophils Relative %: 87 %
Platelets: 347 10*3/uL (ref 150–400)
RBC: 4.12 MIL/uL — ABNORMAL LOW (ref 4.22–5.81)
RDW: 14.2 % (ref 11.5–15.5)
WBC: 15.5 10*3/uL — ABNORMAL HIGH (ref 4.0–10.5)
nRBC: 0 % (ref 0.0–0.2)

## 2024-09-25 LAB — C-REACTIVE PROTEIN: CRP: 40.9 mg/dL — ABNORMAL HIGH

## 2024-09-25 LAB — APTT: aPTT: 35 s (ref 24–36)

## 2024-09-25 MED ORDER — SODIUM CHLORIDE 0.9 % IV SOLN
2.0000 g | INTRAVENOUS | Status: DC
  Administered 2024-09-25 – 2024-09-28 (×4): 2 g via INTRAVENOUS
  Filled 2024-09-25 (×4): qty 20

## 2024-09-25 MED ORDER — MELATONIN 5 MG PO TABS
5.0000 mg | ORAL_TABLET | Freq: Once | ORAL | Status: AC
  Administered 2024-09-25: 5 mg via ORAL
  Filled 2024-09-25: qty 1

## 2024-09-25 MED ORDER — SODIUM CHLORIDE 0.9 % IV SOLN
8.0000 mg/kg | Freq: Every day | INTRAVENOUS | Status: DC
  Administered 2024-09-25 – 2024-09-26 (×2): 900 mg via INTRAVENOUS
  Filled 2024-09-25 (×3): qty 18

## 2024-09-25 NOTE — Consult Note (Addendum)
 "                                                                  [facility]                                                                                        Patient Identification: Patient Name: Alexander Roberts MRN: [id number] Admit Date: 09/24/2024  1:19 AM Today's Date: 09/25/2024 Reason for consult: Discitis and osteomyelitis of lumbar spine Requesting provider: Dr. 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 Or cx Gram stain GPC, cx pending - Per OR note, significant amount of ventral epidural pus at L3-4 and L4-5 on the left  - 1/25 ESR 55, CRP 40.9  # Smoker - to be counseled   # H/o prior low back pain and epidural injection 2 years ago  Recommendations  - Will switch IV vancomycin  to daptomycin  for renal safety. With switch cefepime  to IV ceftriaxone .  Unlikely to have Pseudomonas infection - Follow-up blood cultures - Follow-up or cultures 1/25 - Pulled off on placing PICC line until blood cultures are negative - Post op Care per neurosurgery Universal/standard isolation precautions Following  Rest of the management as per the primary team. Please call with questions or concerns.  Thank you for the consult  __________________________________________________________________________________________________________ HPI and Hospital Course: 39 year old male with prior history as below who  presented to the ED on 1/25 with with worsening of  lower back pain for a week PTA, progressive weakness in the left lower extremity, right lower extremity as well as numbness in the bilateral lower extremities.  He reports history of preceding epidural injection approximately 2 years ago, was also on gabapentin , meloxicam  at that time but back pain was resolved for approximately 2 years until a week prior to ED arrival.  Denies prior back surgeries, IVDU.  He was seen in the ED on 1/16 and 1/18 for back pain, had outpatient MRI done on 1/21 which was concerning for infection. No reported fever or chills, cough, chest pain, shortness of breath, nausea, vomiting, diarrhea, rashes, GU symptoms or joint pain or wounds.  He however reports chronic night sweats which he reports has been for a long time since sleeping with his wife who is more warm.   He smokes cigarettes, works in a store.  Lives with wife.   At ED febrile, tachycardic,  bilateral lower extremity weakness, more in the left lower extremity Labs remarkable for lactic acid 0.9, procalcitonin 0.24, WBC 13.5, hemoglobin 12.7 HIV NR 1/25 blood cx pending 1/25 MRSA PCR negative  Neurosurgery consulted Started on IV vancomycin  and cefepime , IVF, IV Dilaudid ,, blood cultures   ROS: General- Denies fever, chills, loss of appetite and loss of weight HEENT - Denies headache, blurry vision, neck pain, sinus pain Chest - Denies any chest pain, SOB or cough CVS- Denies any dizziness/lightheadedness, syncopal attacks, palpitations Abdomen- Denies any nausea, vomiting, abdominal pain, hematochezia and diarrhea Psych - Denies any changes in mood irritability or depressive symptoms GU- Denies any burning, dysuria, hematuria or increased frequency of urination Skin - denies any rashes/lesions MSK - denies any joint pain/swelling or restricted ROM   Past Medical History:  Diagnosis Date   Medical history non-contributory    Past Surgical History:   Procedure Laterality Date   EXCISION OF TONGUE LESION N/A 10/23/2019   Procedure: RESECTION OF THYROGLOSSAL REMNANT IN BASE OF TONGUE;  Surgeon: Jesus Oliphant, MD;  Location: [facility] OR;  Service: ENT;  Laterality: N/A;   LUMBAR LAMINECTOMY/DECOMPRESSION MICRODISCECTOMY N/A 09/24/2024   Procedure: LUMBAR LAMINECTOMY/DECOMPRESSION MICRODISCECTOMY 1 LEVEL;  Surgeon: Vera Alm Hamilton, MD;  Location: [facility] OR;  Service: Neurosurgery;  Laterality: N/A;  Lumbar Three - Four, Lumbar Four - Five Laminectomy   NO PAST SURGERIES      Scheduled Meds:  acetaminophen   1,000 mg Oral Q6H   celecoxib   200 mg Oral Q12H   nicotine   14 mg Transdermal Daily   senna-docusate  1 tablet Oral BID   sodium chloride  flush  3 mL Intravenous Q12H   sodium chloride  flush  3 mL Intravenous Q12H   sodium chloride  flush  3 mL Intravenous Q12H   Continuous Infusions:  sodium chloride      ceFEPime  (MAXIPIME ) IV Stopped (09/25/24 0225)   vancomycin  175 mL/hr at 09/25/24 0535   PRN Meds:.bisacodyl , hydrALAZINE , HYDROmorphone  (DILAUDID ) injection, ipratropium, menthol  **OR** phenol, methocarbamol , ondansetron  **OR** ondansetron  (ZOFRAN ) IV, oxyCODONE , sodium chloride  flush  Allergies[1]  Social History   Socioeconomic History   Marital status: Married    Spouse name: Not on file   Number of children: Not on file   Years of education: Not on file   Highest education level: Not on file  Occupational History   Not on file  Tobacco Use   Smoking status: Every Day    Current packs/day: 0.50    Types: Cigarettes   Smokeless tobacco: Never  Vaping Use   Vaping status: Never Used  Substance and Sexual Activity   Alcohol use: Yes    Comment: occasional   Drug use: No   Sexual activity: Not on file  Other Topics Concern   Not on file  Social History Narrative   Not on file   Social Drivers of Health   Tobacco Use: High Risk (09/24/2024)   Patient History    Smoking Tobacco Use: Every Day    Smokeless Tobacco Use:  Never    Passive Exposure: Not on file  Financial Resource Strain: Not on file  Food Insecurity: Not on file  Transportation Needs: Not on file  Physical Activity: Not on file  Stress: Not on file  Social Connections: Not on file  Intimate Partner Violence: Not on file  Depression (EYV7-0): Not on file  Alcohol Screen: Not on file  Housing: Not on file  Utilities: Not on file  Health Literacy: Not on file   History reviewed.  No pertinent family history.  Vitals BP (!) 148/80 (BP Location: Right Arm)   Pulse 83   Temp 98.6 F (37 C) (Oral)   Resp 18   SpO2 98%    Physical Exam Constitutional: Awake, alert, nontoxic-appearing    Comments: HEENT WNL  Cardiovascular:     Rate and Rhythm: Normal rate     Heart sounds: S1 and S2  Pulmonary:     Effort: Pulmonary effort is normal.     Comments: Normal breath sounds  Abdominal:     Palpations: Abdomen is soft.     Tenderness: Nondistended and nontender  Musculoskeletal:        General: No swelling or tenderness in peripheral joints or signs of septic joint.  Left leg weakness  Skin:    Comments: No rashes.  Back has a Hemovac, intact  Neurological:     General: Left leg weakness, awake alert and oriented  Psychiatric:        Mood and Affect: Mood normal.    Pertinent Microbiology Results for orders placed or performed during the hospital encounter of 09/24/24  Surgical pcr screen     Status: None   Collection Time: 09/24/24 12:32 PM   Specimen: Nasal Mucosa; Nasal Swab  Result Value Ref Range Status   MRSA, PCR NEGATIVE NEGATIVE Final   Staphylococcus aureus NEGATIVE NEGATIVE Final    Comment: (NOTE) The Xpert SA Assay (FDA approved for NASAL specimens in patients 27 years of age and older), is one component of a comprehensive surveillance program. It is not intended to diagnose infection nor to guide or monitor treatment. Performed at [facility], [address] [place], [place] [address]    Aerobic/Anaerobic Culture w Gram Stain (surgical/deep wound)     Status: None (Preliminary result)   Collection Time: 09/24/24  2:14 PM   Specimen: Abscess  Result Value Ref Range Status   Specimen Description ABSCESS  Final   Special Requests LUMBAR FOUR FIVE EPIDURAL ABSCESS  Final   Gram Stain   Final    RARE WBC PRESENT, PREDOMINANTLY PMN RARE GRAM POSITIVE COCCI Performed at [facility], [address], [place], [place] [address]    Culture PENDING  Incomplete   Report Status PENDING  Incomplete  Aerobic/Anaerobic Culture w Gram Stain (surgical/deep wound)     Status: None (Preliminary result)   Collection Time: 09/24/24  2:21 PM   Specimen: Abscess  Result Value Ref Range Status   Specimen Description ABSCESS  Final   Special Requests LUMBAR FOUR FIVE EPIDURAL ABSCESS  Final   Gram Stain   Final    NO WBC SEEN NO ORGANISMS SEEN Performed at [facility], [address], [place], [place] [address]    Culture PENDING  Incomplete   Report Status PENDING  Incomplete   Pertinent Lab seen by me:    Latest Ref Rng & Units 09/25/2024    4:13 AM 09/24/2024    1:56 AM 10/19/2019    9:04 AM  CBC  WBC 4.0 - 10.5 K/uL 15.5  13.5  3.6   Hemoglobin 13.0 - 17.0 g/dL 88.4  87.2  85.4   Hematocrit 39.0 - 52.0 % 32.6  36.9  45.0   Platelets 150 - 400 K/uL 347  330  254       Latest Ref Rng & Units 09/25/2024    4:13 AM 09/24/2024    7:51 AM 09/24/2024    1:56 AM  CMP  Glucose  70 - 99 mg/dL 882   889   BUN 6 - 20 mg/dL 13   11   Creatinine 9.38 - 1.24 mg/dL 9.23  9.27  9.21   Sodium 135 - 145 mmol/L 131   135   Potassium 3.5 - 5.1 mmol/L 4.3   4.3   Chloride 98 - 111 mmol/L 93   96   CO2 22 - 32 mmol/L 26   27   Calcium  8.9 - 10.3 mg/dL 9.6   89.7   Total Protein 6.5 - 8.1 g/dL   7.3   Total Bilirubin 0.0 - 1.2 mg/dL   0.6   Alkaline Phos 38 - 126 U/L   106   AST 15 - 41 U/L   17   ALT 0 - 44 U/L   30      Pertinent Imagings/Other Imagings Plain films and CT  images have been personally visualized and interpreted; radiology reports have been reviewed. Decision making incorporated into the Impression / Recommendations.  DG Lumbar Spine 1 View Result Date: 09/24/2024 CLINICAL DATA:  Elective surgery. Lumbar laminectomy/decompression with micro discectomy, 1 level. EXAM: LUMBAR SPINE - 1 VIEW COMPARISON:  09/17/2024. FINDINGS: There is no evidence of acute lumbar spine fracture. Alignment is normal. Multilevel intervertebral disc space narrowing, degenerative endplate changes, and facet arthropathy are noted. A needle is present posterior to L5-S1 on image 1. There is a curvilinear structure posterior to L4-L5 on image 2. 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 Electronically Signed   By: Leita Birmingham M.D.   On: 09/24/2024 16:12   MR Lumbar Spine W Wo Contrast Result Date: 09/24/2024 EXAM: MRI LUMBAR SPINE 09/24/2024 02:37:07 PM TECHNIQUE: Multiplanar multisequence MRI of the lumbar spine was performed without and with the administration of intravenous contrast. COMPARISON: 07/31/2025 CLINICAL HISTORY: Low back pain, cauda equina syndrome suspected; Low back pain, infection suspected, positive X-ray/computed tomography. FINDINGS: BONES AND ALIGNMENT: Straightening of the lumbar lordosis. Normal vertebral body heights. There is increased edema throughout the L4 and L5 vertebral bodies. There are areas of signal abnormality and irregularity along the endplates, also increased from prior findings, suggestive of discitis and osteomyelitis. Additional signal abnormality along the L5 inferior endplate. Focal subtle signal abnormality at the posterior aspect of the S1 superior endplate which is new from prior findings are concerning for discitis osteomyelitis also at the L5-S1 level. Degenerative endplate changes posteriorly at L2-L3. Additional Modic type 2 degenerative endplate changes at L5-S1. 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 SPINAL CORD: The conus terminates normally. SOFT TISSUES: There is prominent edema within the paraspinal musculature particularly at L4 and L5 on the right and at L3 through S1 on the left. There is edema noted within the psoas musculature bilaterally. There is a partially visualized paraspinal collection along the right paraspinal soft tissues at the L4-L5 level concerning for paraspinal abscess seen on series 6 image 1 and series 15 image 1. Possible additional smaller paraspinal abscess on the right at the L5 level anterior and inferior to the larger collection. Additional partially visualized enhancing paraspinal collections along left aspect of L5-S1 involving the lower aspect of the psoas musculature at this level series 15 image 17 and series 16 image 44. T12-L1: There is minimal disc bulge. No significant spinal canal or foraminal stenosis. L1-L2: There is minimal disc bulge and mild facet arthrosis. No significant spinal canal or foraminal stenosis. L2-L3: There is a diffuse disc bulge. Bilateral facet arthrosis. Prominent epidural fat. Congenital  short pedicles. Similar severe spinal canal stenosis. Mild bilateral foraminal stenosis. Small annular fissure noted. L3-L4: There is a diffuse disc bulge. Moderate facet arthrosis. Prominent epidural fat. Congenital short pedicles. Additional contribution to spinal canal stenosis from epidural abscess. Similar severe spinal canal stenosis. Mild bilateral facet arthrosis. Additional areas of prominent epidural soft tissue and enhancement along the posterior aspect of L3 which is increased from prior. Within this region there is a 0.9 cm enhancing collection concerning for epidural abscess series 16 image 28. Subtle signal abnormality in the posterior aspect of L3 with edema and mild enhancement concerning for additional focus of osteomyelitis. There is also subtle signal abnormality in the L3-L4 disc which appears new from prior and may reflect additional  areas of discitis. L4-L5: There is a disc bulge and posterior osteophytes. Bilateral facet arthrosis. Prominence of the epidural fat. Epidural abscess also contributing to spinal canal stenosis. Similar severe spinal canal stenosis. There is moderate to severe bilateral foraminal stenosis. There is increased abnormal fluid within the L4-L5 disc space. There are areas of signal abnormality and irregularity along the endplates, also increased from prior findings, suggestive of discitis and osteomyelitis. There is prominent abnormal soft tissue within the ventral epidural fat along the posterior aspect of L4 and L5. There is edema and abnormal enhancement within this region. At the L4 level there is a small hypointense focus with peripheral enhancement right of midline just above the level of the disc which measures up to 0.4 cm which is concerning for epidural abscess series 16 image 36. Additional prominent epidural soft tissue at L5 also with a peripherally enhancing component at midline along the mid aspect of L5 which measures 0.8 x 0.7 cm concerning for additional epidural abscess series 16 image 4. L5-S1: There is a diffuse disc bulge and posterior osteophytes. Bilateral facet arthrosis. Epidural abscess also contributes to spinal canal stenosis. There is similar appearance of moderate spinal canal stenosis and lateral recess narrowing. There is severe right and moderate to severe left foraminal stenosis. There is slightly increased signal abnormality within the L5-S1 disc. Focal subtle signal abnormality at the posterior aspect of the S1 superior endplate which is new from prior findings are concerning for discitis osteomyelitis also at the L5-S1 level. VISUALIZED ABDOMEN AND PELVIS: There are areas of diverticuli along the partially visualized sigmoid colon. Mildly distended urinary bladder. Cyst in the posterior aspect of the left kidney. LIMITATIONS/ARTIFACTS: Axial images are limited by motion artifact.  IMPRESSION: 1. 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 2. Findings consistent with discitis and osteomyelitis at L4-L5 and L5-S1 levels, with additional concern for discitis at L3-L4. Possible osteomyelitis in the posterior L3 vertebral body. 3. Paraspinal and psoas muscle edema with multiple collections concerning for abscesses, including a partially visualized right paraspinal collection at L4-L5 and left paraspinal/psoas collections at L5-S1. 4. Severe spinal canal stenosis at L2-L3, L3-L4, and L4-L5 levels, with severe right and moderate to severe left foraminal stenosis at L5-S1. Electronically signed by: Donnice Mania MD 09/24/2024 03:17 PM EST RP Workstation: [id number]   MR LUMBAR SPINE WO CONTRAST Result Date: 09/20/2024 EXAM: MRI LUMBAR SPINE 09/20/2024 03:38:31 PM TECHNIQUE: Multiplanar multisequence MRI of the lumbar spine was performed without the administration of intravenous contrast. COMPARISON: MRI lumbar spine 01/15/2015. CLINICAL HISTORY: Lumbar back pain with radiculopathy affecting left lower extremity. FINDINGS: BONES AND ALIGNMENT: 5 lumbar  type vertebrae. Straightening of the normal lumbar lordosis. No significant listhesis. No acute fracture. Increased marrow edema in the L4 and L5 vertebral bodies extending into the L5 pedicles and STIR hyperintensity/fluid signal in the L4-5 disc. There is a Schmorl node involving the L5 inferior endplate, however there is no frank endplate erosion. Moderate to prominent Modic type 2 endplate changes at L5-S1. Diffuse congenital narrowing of the lumbar spinal canal due to short pedicles. SPINAL CORD: The conus medullaris terminates at T12 and is normal in signal. SOFT TISSUES: Mild edema in the lower lumbar paraspinal soft tissues bilaterally including involvement of the left greater than right psoas  muscles. No evidence of a paraspinal fluid collection. T12-L1: Mild to moderate facet hypertrophy and at most minimal disc bulging without stenosis. L1-L2: Moderate facet hypertrophy without disc herniation or stenosis. L2-L3: Disc bulging, a chronic broad central disc protrusion with annular fissure, congenitally short pedicles, prominent epidural fat, and moderate facet hypertrophy result in severe spinal stenosis and mild bilateral neural foraminal stenosis, progressed from prior. L3-L4: Disc bulging, a right paracentral disc extrusion, congenitally short pedicles, prominent epidural fat, and moderate facet and ligamentum flavum hypertrophy result in severe spinal stenosis, right greater than left lateral recess stenosis, and mild bilateral neural foraminal stenosis, progressed from prior. L4-L5: Circumferential disc bulging, a central disc extrusion, endplate spurring, congenitally short pedicles, prominent epidural fat, and moderate facet hypertrophy result in severe spinal stenosis and moderate to severe right and severe left neural foraminal stenosis, progressed from prior. There is heterogeneous expansion of the ventral epidural space behind the L4 and L5 vertebral bodies which may reflect epidural fat expansion and engorgement of the epidural venous plexus, although an element of epidural hematoma or phlegmon is also possible. L5-S1: The large right paracentral disc extrusion and possible associated epidural hematoma on the prior MRI have resolved with improved spinal canal and lateral recess patency. Circumferential disc bulging, endplate spurring, congenitally short pedicles, and mild facet hypertrophy result in moderate spinal stenosis, moderate bilateral lateral recess stenosis, and severe right and moderate left neural foraminal stenosis. IMPRESSION: 1. Progressive, severe multifactorial spinal and neural foraminal stenosis at L4-5. Abnormal signal in the disc space and mild vertebral marrow edema may  be degenerative as there is no frank endplate erosion; however, there is mild edema in the paraspinal soft tissues and discitis-osteomyelitis is a consideration in the appropriate clinical setting. Abnormal ventral epidural material at L4 and L5 could reflect venous engorgement versus epidural or phlegmon. 2. Progressive, severe spinal stenosis at L2-3 and L3-4. 3. Moderate spinal stenosis and severe neural foraminal stenosis at L5-S1. Electronically signed by: Dasie Hamburg MD 09/20/2024 04:40 PM EST RP Workstation: [id number]   DG Lumbar Spine 2-3 Views Result Date: 09/15/2024 EXAM: 2 OR 3 VIEW(S) XRAY OF THE LUMBAR SPINE 09/15/2024 04:40:01 AM COMPARISON: Lumbar spine series dated 02/09/2007. CLINICAL HISTORY: Back pain. FINDINGS: LUMBAR SPINE: BONES: Vertebral body heights are maintained. There is a mild dextrocurvature of the lumbar spine. There is straightening of the normal lumbar lordosis. DISCS AND DEGENERATIVE CHANGES: There is mild disc space narrowing at L3-L4. Since the previous study, the patient has developed moderate chronic degenerative disc disease at L4-L5. There is moderate disc space narrowing, endplate sclerosis and anterior osteophyte formation. Patient has also developed mild-to-moderate chronic degenerative disc disease at L5-S1, also with endplate sclerosis and anterior osteophytosis. SOFT TISSUES: No acute abnormality. IMPRESSION: 1. Mild dextrocurvature of the lumbar spine and straightening of the normal lumbar lordosis. 2. Moderate chronic degenerative disc  disease at L4-5 and mild-to-moderate chronic degenerative disc disease at L5-S1, with disc space narrowing, endplate sclerosis, and anterior osteophyte formation. 3. Mild disc space narrowing at L3-4. Electronically signed by: Evalene Coho MD 09/15/2024 04:53 AM EST RP Workstation: [id number]   I personally spent a total of 81 minutes in the care of the patient today including preparing to see the patient, getting/reviewing  separately obtained history, performing a medically appropriate exam/evaluation, counseling and educating, placing orders, documenting clinical information in the EHR, independently interpreting results, communicating results, and coordinating care.  Annalee Orem, MD Infectious Disease Physician [facility] Pager: [phone]      [1] No Known Allergies  "

## 2024-09-25 NOTE — Progress Notes (Signed)
 ID Brief note  Pt taken to OR. See tomorrow  39 year old male with chronic back pain who has previously been seen by pain management at [facility], multiple epidural injection at L4-Ellis with 1 last one done on 11/26/2021 presents for worsening weakness in left leg.  He is having difficulty doing his ADLs.  Found to have # Lumbar spine with multiple epidural abscesses/discitis/osteomyelitis - On arrival temp afebrile, WBC 13.5 K.  MR lumbar spine without contrast showed  L4-5, mild vertebral marrow edema may be degenerative however paraspinal soft tissue present discitis/osteo is a consideration, abnormal ventral epidural material L4-L5 with slight venous engorgement versus epidural or phlegmon. - Patient was started on vancomycin  and cefepime  - ID engaged for antibiotic recommendations  -NSY engaged and MRI lumbar spine w and wo showed multiple epidural abscesses L3-L5, discitis/osteomyelitis of lumbar spine, paraspinous psoas muscle edema with multiple collections concerning for abscesses. - Taken to the OR with neurosurgery Recommendations:  -Okay to continue antibiotics->, has been on cefepime  for today can transition cefepime  to ceftriaxone  tomorrow - Follow-up blood cultures - Follow OR findings and Cx -New ID service Monday

## 2024-09-25 NOTE — Progress Notes (Signed)
 PHARMACY - PHYSICIAN COMMUNICATION CRITICAL VALUE ALERT - BLOOD CULTURE IDENTIFICATION (BCID)  Alexander Roberts is an 39 y.o. male who presented to [facility] on 09/24/2024 with a chief complaint of worsening back pain.  Assessment:  2 of 4 bottles growing Strep species  Name of physician (or Provider) Contacted: Lynwood Kipper, NP  Current antibiotics: ceftriaxone  and daptomycin   Changes to prescribed antibiotics recommended:  Patient is on recommended antibiotics - No changes needed  Results for orders placed or performed during the hospital encounter of 09/24/24  Blood Culture ID Panel (Reflexed) (Collected: 09/24/2024  1:54 AM)  Result Value Ref Range   Enterococcus faecalis NOT DETECTED NOT DETECTED   Enterococcus Faecium NOT DETECTED NOT DETECTED   Listeria monocytogenes NOT DETECTED NOT DETECTED   Staphylococcus species NOT DETECTED NOT DETECTED   Staphylococcus aureus (BCID) NOT DETECTED NOT DETECTED   Staphylococcus epidermidis NOT DETECTED NOT DETECTED   Staphylococcus lugdunensis NOT DETECTED NOT DETECTED   Streptococcus species DETECTED (A) NOT DETECTED   Streptococcus agalactiae NOT DETECTED NOT DETECTED   Streptococcus pneumoniae NOT DETECTED NOT DETECTED   Streptococcus pyogenes NOT DETECTED NOT DETECTED   A.calcoaceticus-baumannii NOT DETECTED NOT DETECTED   Bacteroides fragilis NOT DETECTED NOT DETECTED   Enterobacterales NOT DETECTED NOT DETECTED   Enterobacter cloacae complex NOT DETECTED NOT DETECTED   Escherichia coli NOT DETECTED NOT DETECTED   Klebsiella aerogenes NOT DETECTED NOT DETECTED   Klebsiella oxytoca NOT DETECTED NOT DETECTED   Klebsiella pneumoniae NOT DETECTED NOT DETECTED   Proteus species NOT DETECTED NOT DETECTED   Salmonella species NOT DETECTED NOT DETECTED   Serratia marcescens NOT DETECTED NOT DETECTED   Haemophilus influenzae NOT DETECTED NOT DETECTED   Neisseria meningitidis NOT DETECTED NOT DETECTED   Pseudomonas aeruginosa NOT  DETECTED NOT DETECTED   Stenotrophomonas maltophilia NOT DETECTED NOT DETECTED   Candida albicans NOT DETECTED NOT DETECTED   Candida auris NOT DETECTED NOT DETECTED   Candida glabrata NOT DETECTED NOT DETECTED   Candida krusei NOT DETECTED NOT DETECTED   Candida parapsilosis NOT DETECTED NOT DETECTED   Candida tropicalis NOT DETECTED NOT DETECTED   Cryptococcus neoformans/gattii NOT DETECTED NOT DETECTED    Rocky Slade, PharmD, BCPS 09/25/2024  7:56 PM

## 2024-09-25 NOTE — Progress Notes (Addendum)
 " PROGRESS NOTE    Alexander Roberts  FMW:[id number] DOB: 1986/04/30 DOA: 09/24/2024 PCP: Bon[place], [place]  E, PA-C   Brief Narrative:  39 year old male with no significant past medical history With exception of chronic back pain presenting with worsening back pain over the past few days with worsening weakness in the left leg.   MRI of the spine showed discitis osteomyelitis at L4-L5, epidural fluid collection consistent with epidural abscess on the bottom of L2-L3 down to under the L5 lamina on the left at L4-L5 on the right, significant spinal stenosis L3-L4 and L4-L5  Neurosurgery consulted.  Status post  Decompressive lumbar laminectomy, medial facetectomy foraminotomies L3-4 and L4-5 with evacuation of epidural abscess done on 09/24/2024  On broad-spectrum intravenous antibiotics, awaiting culture results, de-escalate antibiotics once appropriate.  He has been screened for CIR candidacy and is a potential candidate.   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  He is a potential candidate for CIR - Follow-up culture results.  Gram stain with rare GPC.  Infectious disease on board.  Patient is currently on intravenous vancomycin  and Maxipime .  Adjust antibiotics appropriately.  May transition from cefepime  to ceftriaxone  today.  Follow-up blood cultures - Continue with 0.5 to 1 mg of Dilaudid  every 2 hours for severe pain  Tobacco use Counseled regarding  smoking cessation -Accepted NicoDerm patch will be provided   Multilevel spinal stenosis Acute on chronic back pain with a chronic history of multilevel spinal stenosis -with some inflammation - With new onset weakness and paresthesia -Neurosurgery on board, appreciate assistance.  Status post surgery. - Continue with intravenous Dilaudid  for severe pain   Disposition: He is a potential candidate for CIR.  DVT prophylaxis: SCD's Start: 09/24/24 1820 Place and maintain sequential compression device Start: 09/24/24 0835 Place TED hose Start: 09/24/24 0835     Code Status: Full Code Family Communication:  Wife at the bedside Status is: Inpatient Remains inpatient appropriate because: Decompressive lumbar laminectomy, medial facetectomy foraminotomies L3-4 and L4-5 with evacuation of epidural abscess     Subjective:  No acute events overnight. RLE feels stronger than the left. Complaining of numbness of b/l feet and buttocks.   Examination:  General exam: Appears calm and comfortable  Respiratory system: Clear to auscultation. Respiratory effort normal. Cardiovascular system: S1 & S2 heard, RRR. No JVD, murmurs, rubs, gallops or clicks. No pedal edema. Gastrointestinal system: Abdomen is nondistended, soft and nontender. No organomegaly or masses felt. Normal bowel sounds heard. Central nervous system: Alert and oriented. No focal neurological deficits. Extremities: Symmetric 5 x 5 power. Skin: No rashes, lesions or ulcers Psychiatry: Judgement and insight appear normal. Mood & affect appropriate.      Diet Orders (From admission, onward)     Start     Ordered   09/24/24 1851  Diet regular Room service appropriate? Yes; Fluid consistency: Thin  Diet effective now       Question Answer Comment  Room service appropriate? Yes   Fluid consistency:  Thin      09/24/24 1850            Objective: Vitals:   09/24/24 1730 09/24/24 1800 09/24/24 1825 09/25/24 0000  BP:  (!) 136/93 (!) 158/89 (!) 168/90 (!) 148/80  Pulse: 89 89 90 83  Resp: 18 20 20 18   Temp:    98.6 F (37 C)  TempSrc:    Oral  SpO2: 98% 98% 98% 98%    Intake/Output Summary (Last 24 hours) at 09/25/2024 1001 Last data filed at 09/25/2024 0825 Gross per 24 hour  Intake 2522.11 ml  Output 2100 ml  Net 422.11 ml   There were no vitals filed for this visit.  Scheduled Meds:  acetaminophen   1,000 mg Oral Q6H   celecoxib   200 mg Oral Q12H   nicotine   14 mg Transdermal Daily   senna-docusate  1 tablet Oral BID   sodium chloride  flush  3 mL Intravenous Q12H   sodium chloride  flush  3 mL Intravenous Q12H   sodium chloride  flush  3 mL Intravenous Q12H   Continuous Infusions:  sodium chloride      ceFEPime  (MAXIPIME ) IV 2 g (09/25/24 0823)   vancomycin  175 mL/hr at 09/25/24 0535    Nutritional status     There is no height or weight on file to calculate BMI.  Data Reviewed:   CBC: Recent Labs  Lab 09/24/24 0156 09/25/24 0413  WBC 13.5* 15.5*  NEUTROABS 10.4* 13.5*  HGB 12.7* 11.5*  HCT 36.9* 32.6*  MCV 80.7 79.1*  PLT 330 347   Basic Metabolic Panel: Recent Labs  Lab 09/24/24 0156 09/24/24 0751 09/25/24 0413  NA 135  --  131*  K 4.3  --  4.3  CL 96*  --  93*  CO2 27  --  26  GLUCOSE 110*  --  117*  BUN 11  --  13  CREATININE 0.78 0.72 0.76  CALCIUM  10.2  --  9.6  MG  --  1.7  --   PHOS  --  4.3  --    GFR: Estimated Creatinine Clearance: 192.5 mL/min (by C-G formula based on SCr of 0.76 mg/dL). Liver Function Tests: Recent Labs  Lab 09/24/24 0156  AST 17  ALT 30  ALKPHOS 106  BILITOT 0.6  PROT 7.3  ALBUMIN 3.7   No results for input(s): LIPASE, AMYLASE in the last 168 hours. No results for input(s): AMMONIA in the last 168 hours. Coagulation Profile: Recent Labs  Lab 09/24/24 0156 09/25/24 0413  INR 1.0 1.1   Cardiac Enzymes: No results for input(s): CKTOTAL, CKMB, CKMBINDEX, TROPONINI in the last 168 hours. BNP (last 3  results) No results for input(s): PROBNP in the last 8760 hours. HbA1C: No results for input(s): HGBA1C in the last 72 hours. CBG: Recent Labs  Lab 09/24/24 0847 09/25/24 0740  GLUCAP 97 101*   Lipid Profile: No results for input(s): CHOL, HDL, LDLCALC, TRIG, CHOLHDL, LDLDIRECT in the last 72 hours. Thyroid Function Tests: No results for input(s): TSH, T4TOTAL, FREET4, T3FREE, THYROIDAB in the last 72 hours. Anemia Panel: No results for input(s): VITAMINB12, FOLATE, FERRITIN, TIBC, IRON, RETICCTPCT in the last 72 hours. Sepsis Labs: Recent Labs  Lab 09/24/24 0156 09/24/24 0751  PROCALCITON  --  0.24  LATICACIDVEN 0.9  --     Recent Results (from the past 240 hours)  Surgical pcr screen     Status: None   Collection Time: 09/24/24 12:32 PM   Specimen: Nasal Mucosa; Nasal Swab  Result Value Ref Range Status   MRSA, PCR NEGATIVE NEGATIVE Final   Staphylococcus aureus NEGATIVE NEGATIVE Final    Comment: (NOTE) The Xpert SA Assay (FDA approved for NASAL specimens in patients 27 years of age and older), is one component of a comprehensive surveillance program. It is not intended to diagnose infection nor to guide or monitor treatment. Performed at [facility], [address], [place], [place] [address]   Aerobic/Anaerobic Culture w Gram Stain (surgical/deep wound)     Status: None (Preliminary result)   Collection Time: 09/24/24  2:14 PM   Specimen: Abscess  Result Value Ref Range Status   Specimen Description ABSCESS  Final   Special Requests LUMBAR FOUR FIVE EPIDURAL ABSCESS  Final   Gram Stain   Final    RARE WBC PRESENT, PREDOMINANTLY PMN RARE GRAM POSITIVE COCCI    Culture   Final    CULTURE REINCUBATED FOR BETTER GROWTH Performed at [facility], [address], [place], [place] [address]    Report Status PENDING  Incomplete  Aerobic/Anaerobic Culture w Gram Stain (surgical/deep wound)     Status: None  (Preliminary result)   Collection Time: 09/24/24  2:21 PM   Specimen: Abscess  Result Value Ref Range Status   Specimen Description ABSCESS  Final   Special Requests LUMBAR FOUR FIVE EPIDURAL ABSCESS  Final   Gram Stain NO WBC SEEN NO ORGANISMS SEEN   Final   Culture   Final    NO GROWTH < 24 HOURS Performed at [facility], [address], [place], [place] [address]    Report Status PENDING  Incomplete         Radiology Studies: DG Lumbar Spine 1 View Result Date: 09/24/2024 CLINICAL DATA:  Elective surgery. Lumbar laminectomy/decompression with micro discectomy, 1 level. EXAM: LUMBAR SPINE - 1 VIEW COMPARISON:  09/17/2024. FINDINGS: There is no evidence of acute lumbar spine fracture. Alignment is normal. Multilevel intervertebral disc space narrowing, degenerative endplate changes, and facet arthropathy are noted. A needle is present posterior to L5-S1 on image 1. There is a curvilinear structure posterior to L4-L5 on image 2. 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 Electronically Signed   By: Leita Birmingham M.D.   On: 09/24/2024 16:12   MR Lumbar Spine W Wo Contrast Result Date: 09/24/2024 EXAM: MRI LUMBAR SPINE 09/24/2024 02:37:07 PM TECHNIQUE: Multiplanar multisequence MRI of the lumbar spine was performed without and with the administration of intravenous contrast. COMPARISON: 07/31/2025 CLINICAL HISTORY: Low back pain, cauda equina syndrome suspected; Low back pain, infection suspected, positive X-ray/computed tomography. FINDINGS: BONES AND ALIGNMENT: Straightening of the lumbar lordosis. Normal vertebral body heights. There is increased edema throughout the L4 and L5 vertebral bodies. There are areas of signal abnormality and irregularity along the endplates, also increased from prior findings, suggestive of discitis and osteomyelitis. Additional signal abnormality along the L5 inferior endplate. Focal subtle signal abnormality at the posterior aspect  of the S1 superior endplate which is new from prior findings are concerning for discitis osteomyelitis also at the L5-S1 level. Degenerative endplate changes posteriorly at L2-L3. Additional Modic type 2 degenerative endplate changes at L5-S1. 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 SPINAL CORD: The conus terminates normally. SOFT TISSUES: There is prominent edema within the paraspinal musculature particularly at L4 and L5 on the right and at L3 through S1 on the left. There is edema noted within the psoas musculature bilaterally. There is a partially visualized  paraspinal collection along the right paraspinal soft tissues at the L4-L5 level concerning for paraspinal abscess seen on series 6 image 1 and series 15 image 1. Possible additional smaller paraspinal abscess on the right at the L5 level anterior and inferior to the larger collection. Additional partially visualized enhancing paraspinal collections along left aspect of L5-S1 involving the lower aspect of the psoas musculature at this level series 15 image 17 and series 16 image 44. T12-L1: There is minimal disc bulge. No significant spinal canal or foraminal stenosis. L1-L2: There is minimal disc bulge and mild facet arthrosis. No significant spinal canal or foraminal stenosis. L2-L3: There is a diffuse disc bulge. Bilateral facet arthrosis. Prominent epidural fat. Congenital short pedicles. Similar severe spinal canal stenosis. Mild bilateral foraminal stenosis. Small annular fissure noted. L3-L4: There is a diffuse disc bulge. Moderate facet arthrosis. Prominent epidural fat. Congenital short pedicles. Additional contribution to spinal canal stenosis from epidural abscess. Similar severe spinal canal stenosis. Mild bilateral facet arthrosis. Additional areas of prominent epidural soft tissue and enhancement along the posterior aspect of L3 which is increased from prior. Within this region there is a 0.9 cm enhancing collection concerning  for epidural abscess series 16 image 28. Subtle signal abnormality in the posterior aspect of L3 with edema and mild enhancement concerning for additional focus of osteomyelitis. There is also subtle signal abnormality in the L3-L4 disc which appears new from prior and may reflect additional areas of discitis. L4-L5: There is a disc bulge and posterior osteophytes. Bilateral facet arthrosis. Prominence of the epidural fat. Epidural abscess also contributing to spinal canal stenosis. Similar severe spinal canal stenosis. There is moderate to severe bilateral foraminal stenosis. There is increased abnormal fluid within the L4-L5 disc space. There are areas of signal abnormality and irregularity along the endplates, also increased from prior findings, suggestive of discitis and osteomyelitis. There is prominent abnormal soft tissue within the ventral epidural fat along the posterior aspect of L4 and L5. There is edema and abnormal enhancement within this region. At the L4 level there is a small hypointense focus with peripheral enhancement right of midline just above the level of the disc which measures up to 0.4 cm which is concerning for epidural abscess series 16 image 36. Additional prominent epidural soft tissue at L5 also with a peripherally enhancing component at midline along the mid aspect of L5 which measures 0.8 x 0.7 cm concerning for additional epidural abscess series 16 image 4. L5-S1: There is a diffuse disc bulge and posterior osteophytes. Bilateral facet arthrosis. Epidural abscess also contributes to spinal canal stenosis. There is similar appearance of moderate spinal canal stenosis and lateral recess narrowing. There is severe right and moderate to severe left foraminal stenosis. There is slightly increased signal abnormality within the L5-S1 disc. Focal subtle signal abnormality at the posterior aspect of the S1 superior endplate which is new from prior findings are concerning for discitis  osteomyelitis also at the L5-S1 level. VISUALIZED ABDOMEN AND PELVIS: There are areas of diverticuli along the partially visualized sigmoid colon. Mildly distended urinary bladder. Cyst in the posterior aspect of the left kidney. LIMITATIONS/ARTIFACTS: Axial images are limited by motion artifact. IMPRESSION: 1. 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 2. Findings consistent with discitis and osteomyelitis at L4-L5 and L5-S1 levels, with additional concern for discitis at L3-L4. Possible osteomyelitis in the posterior  L3 vertebral body. 3. Paraspinal and psoas muscle edema with multiple collections concerning for abscesses, including a partially visualized right paraspinal collection at L4-L5 and left paraspinal/psoas collections at L5-S1. 4. Severe spinal canal stenosis at L2-L3, L3-L4, and L4-L5 levels, with severe right and moderate to severe left foraminal stenosis at L5-S1. Electronically signed by: Donnice Mania MD 09/24/2024 03:17 PM EST RP Workstation: [id number]           LOS: 1 day   Time spent= 35 mins    Deliliah Room, MD [facility]  If 7PM-7AM, please contact night-coverage  09/25/2024, 10:01 AM  "

## 2024-09-25 NOTE — Progress Notes (Signed)
 Inpatient Rehab Admissions Coordinator Note:   Per therapy recommendations patient was screened for CIR candidacy by Reche FORBES Lowers, PT. At this time, pt appears to be a potential candidate for CIR. I will place an order for rehab consult for full assessment, per our protocol.  Please contact me any with questions.SABRA Reche Lowers, PT, DPT [phone] 09/25/24 9:37 AM

## 2024-09-25 NOTE — Progress Notes (Signed)
 Inpatient Rehab Admissions Coordinator:  Consult received. Attempted to contact pt and his significant other. No answer to either phone. Will continue to follow.   Tinnie Yvone Cohens, MS, CCC-SLP Admissions Coordinator [phone]

## 2024-09-25 NOTE — Evaluation (Signed)
 Occupational Therapy Evaluation Patient Details Name: Alexander Roberts MRN: [id number] DOB: November 21, 1985 Today's Date: 09/25/2024   History of Present Illness   Pt is a 39 y.o. 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 S/p decompressive lumbar laminectomy, medial facetectomy foraminotomies L3-4 and L4-5 with evacuation of epidural abscess 1/25. Significant PMH: none.     Clinical Impressions PTA, pt lives with family, typically completely Independent and working at [employer]. Pt presents now with deficits in back pain, balance and sensation. Pt requiring Supervision to Mod A for bed mobility aspects, reliant on BUE support EOB d/t pain. Educated on back precautions and considerations for ADL mgmt. Pt requiring Min A for UB ADL and Max A for LB ADLs. Pt will benefit from AE education to improve LB ADL independence in next session. Wife at bedside and supportive. Based on high PLOF and rehab potential, recommend intensive rehab services > 3 hours therapy per day upon DC.     If plan is discharge home, recommend the following:   A lot of help with walking and/or transfers;A lot of help with bathing/dressing/bathroom;Assistance with cooking/housework;Assist for transportation     Functional Status Assessment   Patient has had a recent decline in their functional status and demonstrates the ability to make significant improvements in function in a reasonable and predictable amount of time.     Equipment Recommendations   Other (comment) (TBD pending progress)     Recommendations for Other Services   Rehab consult     Precautions/Restrictions   Precautions Precautions: Fall;Back Precaution Booklet Issued: Yes (comment) Recall of Precautions/Restrictions: Intact Precaution/Restrictions Comments: Hemovac,  no back brace needed Restrictions Weight Bearing Restrictions Per Provider Order: No     Mobility Bed Mobility Overal bed mobility: Needs Assistance Bed Mobility: Rolling, Sidelying to Sit, Sit to Sidelying Rolling: Supervision, Used rails Sidelying to sit: Supervision     Sit to sidelying: Mod assist General bed mobility comments: with increased time, able to sit EOB without assist. Mod A for BLE back tobed    Transfers                   General transfer comment: Did not test d/t pt pain levels. Was Mod A to stand with PT earlier in AM      Balance Overall balance assessment: Needs assistance Sitting-balance support: Feet supported, Bilateral upper extremity supported Sitting balance-Leahy Scale: Fair Sitting balance - Comments: Reliant on BUE support due to pain                                   ADL either performed or assessed with clinical judgement   ADL Overall ADL's : Needs assistance/impaired Eating/Feeding: Independent   Grooming: Set up;Sitting   Upper Body Bathing: Minimal assistance;Sitting   Lower Body Bathing: Maximal assistance;Sit to/from stand;Sitting/lateral leans   Upper Body Dressing : Minimal assistance;Sitting   Lower Body Dressing: Maximal assistance;Sit to/from stand;Sitting/lateral leans Lower Body Dressing Details (indicate cue type and reason): pt typically able to form figure four position with LE but unable to do so d/t pain and functional deficits. Discussed AE for LB ADL and plan to bring in next session to educate Toilet Transfer: Moderate assistance;Stand-pivot;BSC/3in1;Rolling walker (2 wheels)   Toileting- Clothing Manipulation and Hygiene: Maximal  assistance;Sitting/lateral lean;Sit to/from stand               Vision Ability to See in Adequate Light: 0 Adequate Patient Visual Report: No change from baseline Vision Assessment?: No apparent visual deficits     Perception         Praxis          Pertinent Vitals/Pain Pain Assessment Pain Assessment: Faces Faces Pain Scale: Hurts even more Pain Location: back sitting EOB Pain Descriptors / Indicators: Operative site guarding, Grimacing, Moaning, Other (Comment) (sweating) Pain Intervention(s): Monitored during session, Limited activity within patient's tolerance     Extremity/Trunk Assessment Upper Extremity Assessment Upper Extremity Assessment: Overall WFL for tasks assessed;Right hand dominant   Lower Extremity Assessment Lower Extremity Assessment: Defer to PT evaluation   Cervical / Trunk Assessment Cervical / Trunk Assessment: Back Surgery   Communication Communication Communication: No apparent difficulties   Cognition Arousal: Alert Behavior During Therapy: WFL for tasks assessed/performed Cognition: No apparent impairments                               Following commands: Intact       Cueing  General Comments   Cueing Techniques: Verbal cues  Wife at bedside   Exercises Exercises: Other exercises Other Exercises Other Exercises: L thigh quad and glute activation bed level   Shoulder Instructions      Home Living Family/patient expects to be discharged to:: Private residence Living Arrangements: Spouse/significant other;Children (22 yo son) Available Help at Discharge: Family Type of Home: House Home Access: Stairs to enter Secretary/administrator of Steps: 4 Entrance Stairs-Rails: Left Home Layout: One level     Bathroom Shower/Tub: Chief Strategy Officer: Standard     Home Equipment: Agricultural Consultant (2 wheels)          Prior Functioning/Environment Prior Level of Function : Independent/Modified Independent;Working/employed;Driving             Mobility Comments: 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, stocks floors, does paperwork ADLs Comments: Indep with ADLs and IADLs    OT Problem List: Decreased strength;Decreased activity  tolerance;Impaired balance (sitting and/or standing);Decreased knowledge of use of DME or AE;Decreased knowledge of precautions;Pain   OT Treatment/Interventions: Self-care/ADL training;Therapeutic exercise;Energy conservation;DME and/or AE instruction;Therapeutic activities;Patient/family education;Balance training      OT Goals(Current goals can be found in the care plan section)   Acute Rehab OT Goals Patient Stated Goal: do whatever i can do to get better OT Goal Formulation: With patient Time For Goal Achievement: 10/09/24 Potential to Achieve Goals: Good   OT Frequency:  Min 2X/week    Co-evaluation              AM-PAC OT 6 Clicks Daily Activity     Outcome Measure Help from another person eating meals?: None Help from another person taking care of personal grooming?: A Little Help from another person toileting, which includes using toliet, bedpan, or urinal?: A Lot Help from another person bathing (including washing, rinsing, drying)?: A Lot Help from another person to put on and taking off regular upper body clothing?: A Little Help from another person to put on and taking off regular lower body clothing?: A Lot 6 Click Score: 16   End of Session Nurse Communication: Mobility status;Other (comment) (pt and wife asking if he would be able to shower)  Activity Tolerance: Patient tolerated treatment well;Patient limited by  pain Patient left: in bed;with call bell/phone within reach;with family/visitor present  OT Visit Diagnosis: Unsteadiness on feet (R26.81);Other abnormalities of gait and mobility (R26.89);Muscle weakness (generalized) (M62.81)                Time: 8779-8754 OT Time Calculation (min): 25 min Charges:  OT General Charges $OT Visit: 1 Visit OT Evaluation $OT Eval Moderate Complexity: 1 Mod  Mliss NOVAK, OTR/L Acute Rehab Services Office: [phone]   Mliss Fish 09/25/2024, 1:23 PM

## 2024-09-25 NOTE — Evaluation (Signed)
 Physical Therapy Evaluation Patient Details Name: Alexander Roberts MRN: [id number] DOB: 01/09/1986 Today's Date: 09/25/2024  History of Present Illness  Pt is a 39 y.o. 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 S/p decompressive lumbar laminectomy, medial facetectomy foraminotomies L3-4 and L4-5 with evacuation of epidural abscess 1/25. Significant PMH: none.  Clinical Impression  PTA, pt lives with his spouse and 55 y.o. son and works at [employer] and enjoys fishing. Pt presents with decreased functional mobility secondary to deficits in sensation/proprioception (pt reports buttocks, R foot, and distal to L knee), LLE proximal weakness, impaired standing balance, back pain. Pt requiring moderate assist for bed mobility and pivotal transfer using RW over to chair. Suspect good progress pending pain management given pt age, high levels of motivation, and good family support. Patient will benefit from intensive inpatient follow-up therapy, >3 hours/day to address deficits and maximize functional mobility.         If plan is discharge home, recommend the following: A lot of help with walking and/or transfers;A lot of help with bathing/dressing/bathroom   Can travel by private vehicle        Equipment Recommendations BSC/3in1  Recommendations for Other Services  Rehab consult    Functional Status Assessment Patient has had a recent decline in their functional status and demonstrates the ability to make significant improvements in function in a reasonable and predictable amount of time.     Precautions / Restrictions Precautions Precautions: Fall;Back Precaution Booklet Issued: No Recall of Precautions/Restrictions: Intact Precaution/Restrictions Comments: Hemovac Restrictions Weight Bearing Restrictions Per Provider  Order: No      Mobility  Bed Mobility Overal bed mobility: Needs Assistance Bed Mobility: Sidelying to Sit   Sidelying to sit: Mod assist       General bed mobility comments: Pt received in R sidelying. Reviewed log roll technique. Assist for LLE management and heavy trunk assist to bring into upright position. HOB elevated    Transfers Overall transfer level: Needs assistance Equipment used: Rolling walker (2 wheels) Transfers: Sit to/from Stand, Bed to chair/wheelchair/BSC Sit to Stand: Mod assist   Step pivot transfers: Mod assist       General transfer comment: PT stabilizing walker while pt pulled up to stand with modA. Verbal cues for weight shift to R foot to bring L foot medially and into neutral position. Significantly increased time for pivotal transfer over to chair with step by step cueing. Heavy use of UE's on walker    Ambulation/Gait                  Stairs            Wheelchair Mobility     Tilt Bed    Modified Rankin (Stroke Patients Only)       Balance Overall balance assessment: Needs assistance Sitting-balance support: Feet supported Sitting balance-Leahy Scale: Fair Sitting balance - Comments: Reliant on BUE support due to pain   Standing balance support: Bilateral upper extremity supported Standing balance-Leahy Scale: Poor                               Pertinent Vitals/Pain Pain Assessment Pain Assessment: Faces Faces Pain Scale: Hurts whole lot Pain Location: 4/10 at rest, significant pain with standing Pain Descriptors / Indicators: Operative site  guarding, Grimacing, Moaning Pain Intervention(s): Limited activity within patient's tolerance, Monitored during session, Premedicated before session    Home Living Family/patient expects to be discharged to:: Private residence Living Arrangements: Spouse/significant other;Children (21 y.o. 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 Strength 5/5 LLE Deficits / Details: Decreased light touch sensation distal to knee. R hip flexion 1/5, knee extension 2/5, ankle dorsiflexion 5/5    Cervical / Trunk Assessment Cervical / Trunk Assessment: Back Surgery  Communication   Communication Communication: No apparent difficulties    Cognition Arousal: Alert Behavior During Therapy: WFL for tasks assessed/performed   PT - Cognitive impairments: No apparent impairments                         Following commands: Intact       Cueing Cueing Techniques: Verbal cues     General Comments      Exercises     Assessment/Plan    PT Assessment Patient needs continued PT services  PT Problem List Decreased strength;Decreased activity tolerance;Decreased mobility;Decreased balance;Pain       PT Treatment Interventions DME instruction;Gait training;Stair training;Functional mobility training;Therapeutic activities;Therapeutic exercise;Balance training;Patient/family education    PT Goals (Current goals can be found in the Care Plan section)  Acute Rehab PT Goals Patient Stated Goal: to return to work, fishing PT Goal Formulation: With patient Time For Goal Achievement: 10/09/24 Potential to Achieve Goals: Good    Frequency Min 5X/week     Co-evaluation                AM-PAC PT 6 Clicks Mobility  Outcome Measure Help needed turning from your back to your side while in a flat bed without using bedrails?: A Lot Help needed moving from lying on your back to sitting on the side of a flat bed without using bedrails?: A Lot Help needed moving to and from a bed to a chair (including a wheelchair)?: A Lot Help needed standing up from a chair using your arms (e.g., wheelchair or bedside chair)?: A Lot Help needed to walk in hospital room?: Total Help needed climbing 3-5 steps with a railing? : Total 6 Click Score: 10    End of Session Equipment Utilized During Treatment: Gait belt Activity Tolerance: Patient tolerated treatment well Patient left: in chair;with call bell/phone within reach;with family/visitor present Nurse Communication: Mobility status PT Visit Diagnosis: Unsteadiness on feet (R26.81);Difficulty in walking, not elsewhere classified (R26.2);Pain Pain - part of body:  (back)    Time: 0825-0905 PT Time Calculation (min) (ACUTE ONLY): 40 min   Charges:   PT Evaluation $PT Eval Low Complexity: 1 Low PT Treatments $Therapeutic Activity: 23-37 mins PT General Charges $$ ACUTE PT VISIT: 1 Visit         [name], PT, DPT Acute Rehabilitation Services  Office [phone]   [name] 09/25/2024, 9:13 AM

## 2024-09-25 NOTE — Care Management (Signed)
 Notified Pam with [facility] of potential  for home IV abx.

## 2024-09-25 NOTE — Progress Notes (Signed)
 Patient ID: Maximos Zayas, male   DOB: Jun 26, 1986, 39 y.o.   MRN: [id number] Subjective: Patient reports back pain with standing with PT, but best sleep in many days, no leg pain, voiding well, still some saddle numbness and leg numbness. C/o L leg weakness though improved  Objective: Vital signs in last 24 hours: Temp:  [97.5 F (36.4 C)-100.5 F (38.1 C)] 98.6 F (37 C) (01/26 0000) Pulse Rate:  [83-116] 83 (01/26 0000) Resp:  [13-25] 18 (01/26 0000) BP: (136-168)/(80-101) 148/80 (01/26 0000) SpO2:  [92 %-100 %] 98 % (01/26 0000)  Intake/Output from previous day: 01/25 0701 - 01/26 0700 In: 2402.1 [P.O.:237; I.V.:1200; IV Piggyback:965.1] Out: 2100 [Urine:1400; Emesis/NG output:500; Blood:200] Intake/Output this shift: Total I/O In: 120 [P.O.:120] Out: -   Neurologic: Grossly normal except L HF 1/5 and L quad 3-/5  Lab Results: Lab Results  Component Value Date   WBC 15.5 (H) 09/25/2024   HGB 11.5 (L) 09/25/2024   HCT 32.6 (L) 09/25/2024   MCV 79.1 (L) 09/25/2024   PLT 347 09/25/2024   Lab Results  Component Value Date   INR 1.1 09/25/2024   BMET Lab Results  Component Value Date   NA 131 (L) 09/25/2024   K 4.3 09/25/2024   CL 93 (L) 09/25/2024   CO2 26 09/25/2024   GLUCOSE 117 (H) 09/25/2024   BUN 13 09/25/2024   CREATININE 0.76 09/25/2024   CALCIUM  9.6 09/25/2024    Studies/Results: DG Lumbar Spine 1 View Result Date: 09/24/2024 CLINICAL DATA:  Elective surgery. Lumbar laminectomy/decompression with micro discectomy, 1 level. EXAM: LUMBAR SPINE - 1 VIEW COMPARISON:  09/17/2024. FINDINGS: There is no evidence of acute lumbar spine fracture. Alignment is normal. Multilevel intervertebral disc space narrowing, degenerative endplate changes, and facet arthropathy are noted. A needle is present posterior to L5-S1 on image 1. There is a curvilinear structure posterior to L4-L5 on image 2. 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  Electronically Signed   By: Leita Birmingham M.D.   On: 09/24/2024 16:12   MR Lumbar Spine W Wo Contrast Result Date: 09/24/2024 EXAM: MRI LUMBAR SPINE 09/24/2024 02:37:07 PM TECHNIQUE: Multiplanar multisequence MRI of the lumbar spine was performed without and with the administration of intravenous contrast. COMPARISON: 07/31/2025 CLINICAL HISTORY: Low back pain, cauda equina syndrome suspected; Low back pain, infection suspected, positive X-ray/computed tomography. FINDINGS: BONES AND ALIGNMENT: Straightening of the lumbar lordosis. Normal vertebral body heights. There is increased edema throughout the L4 and L5 vertebral bodies. There are areas of signal abnormality and irregularity along the endplates, also increased from prior findings, suggestive of discitis and osteomyelitis. Additional signal abnormality along the L5 inferior endplate. Focal subtle signal abnormality at the posterior aspect of the S1 superior endplate which is new from prior findings are concerning for discitis osteomyelitis also at the L5-S1 level. Degenerative endplate changes posteriorly at L2-L3. Additional Modic type 2 degenerative endplate changes at L5-S1. 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 SPINAL CORD: The conus terminates normally. SOFT TISSUES: There is prominent edema within the paraspinal musculature particularly at L4 and L5 on the right and at L3 through S1 on the left. There is edema noted within the psoas musculature bilaterally. There is a partially visualized paraspinal collection along the right paraspinal soft tissues at the L4-L5 level concerning for paraspinal abscess seen on series 6 image 1 and series 15 image 1. Possible additional smaller paraspinal abscess on the right at the L5 level anterior and inferior to the  larger collection. Additional partially visualized enhancing paraspinal collections along left aspect of L5-S1 involving the lower aspect of the psoas musculature at this level series  15 image 17 and series 16 image 44. T12-L1: There is minimal disc bulge. No significant spinal canal or foraminal stenosis. L1-L2: There is minimal disc bulge and mild facet arthrosis. No significant spinal canal or foraminal stenosis. L2-L3: There is a diffuse disc bulge. Bilateral facet arthrosis. Prominent epidural fat. Congenital short pedicles. Similar severe spinal canal stenosis. Mild bilateral foraminal stenosis. Small annular fissure noted. L3-L4: There is a diffuse disc bulge. Moderate facet arthrosis. Prominent epidural fat. Congenital short pedicles. Additional contribution to spinal canal stenosis from epidural abscess. Similar severe spinal canal stenosis. Mild bilateral facet arthrosis. Additional areas of prominent epidural soft tissue and enhancement along the posterior aspect of L3 which is increased from prior. Within this region there is a 0.9 cm enhancing collection concerning for epidural abscess series 16 image 28. Subtle signal abnormality in the posterior aspect of L3 with edema and mild enhancement concerning for additional focus of osteomyelitis. There is also subtle signal abnormality in the L3-L4 disc which appears new from prior and may reflect additional areas of discitis. L4-L5: There is a disc bulge and posterior osteophytes. Bilateral facet arthrosis. Prominence of the epidural fat. Epidural abscess also contributing to spinal canal stenosis. Similar severe spinal canal stenosis. There is moderate to severe bilateral foraminal stenosis. There is increased abnormal fluid within the L4-L5 disc space. There are areas of signal abnormality and irregularity along the endplates, also increased from prior findings, suggestive of discitis and osteomyelitis. There is prominent abnormal soft tissue within the ventral epidural fat along the posterior aspect of L4 and L5. There is edema and abnormal enhancement within this region. At the L4 level there is a small hypointense focus with  peripheral enhancement right of midline just above the level of the disc which measures up to 0.4 cm which is concerning for epidural abscess series 16 image 36. Additional prominent epidural soft tissue at L5 also with a peripherally enhancing component at midline along the mid aspect of L5 which measures 0.8 x 0.7 cm concerning for additional epidural abscess series 16 image 4. L5-S1: There is a diffuse disc bulge and posterior osteophytes. Bilateral facet arthrosis. Epidural abscess also contributes to spinal canal stenosis. There is similar appearance of moderate spinal canal stenosis and lateral recess narrowing. There is severe right and moderate to severe left foraminal stenosis. There is slightly increased signal abnormality within the L5-S1 disc. Focal subtle signal abnormality at the posterior aspect of the S1 superior endplate which is new from prior findings are concerning for discitis osteomyelitis also at the L5-S1 level. VISUALIZED ABDOMEN AND PELVIS: There are areas of diverticuli along the partially visualized sigmoid colon. Mildly distended urinary bladder. Cyst in the posterior aspect of the left kidney. LIMITATIONS/ARTIFACTS: Axial images are limited by motion artifact. IMPRESSION: 1. 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 2. Findings consistent with discitis and osteomyelitis at L4-L5 and L5-S1 levels, with additional concern for discitis at L3-L4. Possible osteomyelitis in the posterior L3 vertebral body. 3. Paraspinal and psoas muscle edema with multiple collections concerning for abscesses, including a partially visualized right paraspinal collection at L4-L5 and left paraspinal/psoas collections at L5-S1. 4. Severe spinal canal stenosis at L2-L3, L3-L4, and L4-L5 levels, with severe right  and moderate to severe left foraminal stenosis at  L5-S1. Electronically signed by: Donnice Mania MD 09/24/2024 03:17 PM EST RP Workstation: [id number]    Assessment/Plan: Doing well POD#1, gram stain with rare GPC. On vanco and maxipime .   Continue ABX Await cxs PT/OT  Estimated body mass index is 39.96 kg/m as calculated from the following:   Height as of 09/17/24: 6' 3 (1.905 m).   Weight as of 09/17/24: 145 kg.    LOS: 1 day    Alm GORMAN Molt 09/25/2024, 9:20 AM

## 2024-09-26 DIAGNOSIS — M4646 Discitis, unspecified, lumbar region: Secondary | ICD-10-CM | POA: Diagnosis not present

## 2024-09-26 DIAGNOSIS — B955 Unspecified streptococcus as the cause of diseases classified elsewhere: Secondary | ICD-10-CM | POA: Diagnosis not present

## 2024-09-26 DIAGNOSIS — R7881 Bacteremia: Secondary | ICD-10-CM | POA: Diagnosis not present

## 2024-09-26 DIAGNOSIS — G062 Extradural and subdural abscess, unspecified: Secondary | ICD-10-CM | POA: Diagnosis not present

## 2024-09-26 DIAGNOSIS — M4626 Osteomyelitis of vertebra, lumbar region: Secondary | ICD-10-CM

## 2024-09-26 DIAGNOSIS — F17210 Nicotine dependence, cigarettes, uncomplicated: Secondary | ICD-10-CM | POA: Diagnosis not present

## 2024-09-26 DIAGNOSIS — M869 Osteomyelitis, unspecified: Secondary | ICD-10-CM | POA: Diagnosis not present

## 2024-09-26 LAB — CK: Total CK: 92 U/L (ref 49–397)

## 2024-09-26 LAB — CBC WITH DIFFERENTIAL/PLATELET
Abs Immature Granulocytes: 0.12 10*3/uL — ABNORMAL HIGH (ref 0.00–0.07)
Basophils Absolute: 0 10*3/uL (ref 0.0–0.1)
Basophils Relative: 0 %
Eosinophils Absolute: 0.1 10*3/uL (ref 0.0–0.5)
Eosinophils Relative: 1 %
HCT: 33.8 % — ABNORMAL LOW (ref 39.0–52.0)
Hemoglobin: 11.6 g/dL — ABNORMAL LOW (ref 13.0–17.0)
Immature Granulocytes: 1 %
Lymphocytes Relative: 17 %
Lymphs Abs: 1.9 10*3/uL (ref 0.7–4.0)
MCH: 28 pg (ref 26.0–34.0)
MCHC: 34.3 g/dL (ref 30.0–36.0)
MCV: 81.6 fL (ref 80.0–100.0)
Monocytes Absolute: 1.2 10*3/uL — ABNORMAL HIGH (ref 0.1–1.0)
Monocytes Relative: 11 %
Neutro Abs: 7.4 10*3/uL (ref 1.7–7.7)
Neutrophils Relative %: 70 %
Platelets: 403 10*3/uL — ABNORMAL HIGH (ref 150–400)
RBC: 4.14 MIL/uL — ABNORMAL LOW (ref 4.22–5.81)
RDW: 14.1 % (ref 11.5–15.5)
WBC: 10.7 10*3/uL — ABNORMAL HIGH (ref 4.0–10.5)
nRBC: 0 % (ref 0.0–0.2)

## 2024-09-26 LAB — BASIC METABOLIC PANEL WITH GFR
Anion gap: 12 (ref 5–15)
BUN: 13 mg/dL (ref 6–20)
CO2: 24 mmol/L (ref 22–32)
Calcium: 9.3 mg/dL (ref 8.9–10.3)
Chloride: 96 mmol/L — ABNORMAL LOW (ref 98–111)
Creatinine, Ser: 0.7 mg/dL (ref 0.61–1.24)
GFR, Estimated: >60 mL/min
Glucose, Bld: 89 mg/dL (ref 70–99)
Potassium: 4.1 mmol/L (ref 3.5–5.1)
Sodium: 132 mmol/L — ABNORMAL LOW (ref 135–145)

## 2024-09-26 LAB — GLUCOSE, CAPILLARY: Glucose-Capillary: 103 mg/dL — ABNORMAL HIGH (ref 70–99)

## 2024-09-26 LAB — TSH: TSH: 1.12 u[IU]/mL (ref 0.350–4.500)

## 2024-09-26 LAB — VITAMIN D 25 HYDROXY (VIT D DEFICIENCY, FRACTURES): Vit D, 25-Hydroxy: 6.9 ng/mL — ABNORMAL LOW (ref 30–100)

## 2024-09-26 MED ORDER — DULOXETINE HCL 60 MG PO CPEP: 60.0000 mg | ORAL_CAPSULE | Freq: Every day | ORAL | Status: DC

## 2024-09-26 MED ORDER — TRAZODONE HCL 50 MG PO TABS
50.0000 mg | ORAL_TABLET | Freq: Every evening | ORAL | Status: DC | PRN
  Administered 2024-09-26: 50 mg via ORAL
  Filled 2024-09-26: qty 1

## 2024-09-26 MED ORDER — DULOXETINE HCL 30 MG PO CPEP
30.0000 mg | ORAL_CAPSULE | Freq: Every day | ORAL | Status: DC
  Administered 2024-09-26 – 2024-09-29 (×4): 30 mg via ORAL
  Filled 2024-09-26 (×5): qty 1

## 2024-09-26 MED ORDER — MELATONIN 5 MG PO TABS
5.0000 mg | ORAL_TABLET | Freq: Once | ORAL | Status: AC
  Administered 2024-09-26: 5 mg via ORAL
  Filled 2024-09-26: qty 1

## 2024-09-26 NOTE — TOC Initial Note (Signed)
 Transition of Care (TOC) - Initial/Assessment Note   Spoke to patient via phone. Discussed inpatient Roberts . Patient is interested in [facility] Inpatient [facility]. Per notes admissions Coordinator for CIR was unable to reach patient and his spouse yesterday by phone. Patient reports no missed calls on his cell phone . NCM messaged CIR asking the to try to call patient on cell phone [phone]    Patient aware if he meets criteria for inpatient Roberts , CIR will submit for insurance auth.   If patient goes home at discharged and needs IV antibiotics , he will have an infusion company (pharmacy) [facility] and he and spouse will be taught how to administer IV ABX at home. He will have a HHRN but HHRN will not be present every time a dose is due .   Inpatient Care Management Team will continue to follow .   Patient Details  Name: Alexander Roberts MRN: [id number] Date of Birth: 1985-09-04  Transition of Care [facility]) CM/SW Contact:    Alexander Powell Jansky, RN Phone Number: 09/26/2024, 9:54 AM  Clinical Narrative:                   Expected Discharge Plan: IP [facility] Facility Barriers to Discharge: Continued Medical Work up   Patient Goals and CMS Choice Patient states their goals for this hospitalization and ongoing recovery are:: for inpatient Roberts then home CMS Medicare.gov Compare Post Acute Care list provided to:: Patient Choice offered to / list presented to : Patient      Expected Discharge Plan and Services   Discharge Planning Services: CM Consult Post Acute Care Choice: IP Roberts Living arrangements for the past 2 months: Single Family Home                 DME Arranged:  (see note)         HH Arranged:  (see note)          Prior Living Arrangements/Services Living arrangements for the past 2 months: Single Family Home Lives with:: Spouse Patient language and need for interpreter reviewed:: Yes Do you feel safe going back to the place where you live?: Yes      Need for Family  Participation in Patient Care: Yes (Comment) Care giver support system in place?: Yes (comment)   Criminal Activity/Legal Involvement Pertinent to Current Situation/Hospitalization: No - Comment as needed  Activities of Daily Living   ADL Screening (condition at time of admission) Independently performs ADLs?: No Does the patient have a NEW difficulty with bathing/dressing/toileting/self-feeding that is expected to last >3 days?: Yes (Initiates electronic notice to provider for possible OT consult) Does the patient have a NEW difficulty with getting in/out of bed, walking, or climbing stairs that is expected to last >3 days?: Yes (Initiates electronic notice to provider for possible PT consult) Does the patient have a NEW difficulty with communication that is expected to last >3 days?: No Is the patient deaf or have difficulty hearing?: No Does the patient have difficulty seeing, even when wearing glasses/contacts?: No Does the patient have difficulty concentrating, remembering, or making decisions?: No  Permission Sought/Granted   Permission granted to share information with : Yes, Verbal Permission Granted     Permission granted to share info w AGENCY: [facility] and [facility]        Emotional Assessment   Attitude/Demeanor/Rapport: Engaged Affect (typically observed): Appropriate Orientation: : Oriented to Self, Oriented to Place, Oriented to  Time, Oriented to Situation Alcohol /  Substance Use: Not Applicable Psych Involvement: No (comment)  Admission diagnosis:  Weakness [R53.1] Osteomyelitis (HCC) [M86.9] Low back pain, unspecified back pain laterality, unspecified chronicity, unspecified whether sciatica present [M54.50] Discitis [M46.40] Patient Active Problem List   Diagnosis Date Noted   Epidural abscess 09/25/2024   Osteomyelitis (HCC) 09/24/2024   Discitis of lumbar region 09/24/2024   Multilevel spinal stenosis 09/24/2024   Tobacco use 09/24/2024   Back  pain 09/24/2024   Discitis 09/24/2024   Tongue mass 10/23/2019   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 [place] [address] Phone: [phone] Fax: [phone]  [facility] - [facility] [address] [place] [address] Phone: [phone] Fax: [phone]  [id number] [place], [place] - [address] AT [facility] OF [address] [place] [address] Phone: [phone] Fax: [phone]  [id number] - [place], [place] - [address] [place] [address] Phone: [phone] Fax: [phone]     Social Drivers of Health (SDOH) Social History: SDOH Screenings   Tobacco Use: High Risk (09/24/2024)   SDOH Interventions:     Readmission Risk Interventions     No data to display

## 2024-09-26 NOTE — Progress Notes (Signed)
 SPIRITUAL CARE AND COUNSELING CONSULT NOTE   VISIT SUMMARY Chaplain responded to spiritual care consult - request for prayer. Pt Alexander Roberts shared some frustrations with not recovering/moving both legs as quickly as hoped. Alexander Roberts had visitors and was open to a follow up visit for further exploration and support.   Please page chaplains at the number below or place new consult when Fonda and/or family is ready   SPIRITUAL ENCOUNTER                                                                                                                                                                      Type of Visit: Initial Care provided to:: Pt and family Referral source: Patient request, Family Reason for visit: Routine spiritual support OnCall Visit: No  If immediate needs arise, please contact Jolynn Pack 24 hour on call [phone]   Donnice JINNY Shuck, Chaplain  09/26/2024 4:00 PM

## 2024-09-26 NOTE — Consult Note (Signed)
 [facility] Psychiatric Consult Initial  Patient Name: .Alexander Roberts  MRN: [id number]  DOB: 03-05-1986  Consult Order details:  Orders (From admission, onward)     Start     Ordered   09/25/24 1134  IP CONSULT TO PSYCHIATRY       Ordering Provider: Dino Antu, MD  Provider:  (Not yet assigned)  Question Answer Comment  Location [facility]   Reason for Consult? 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  Tobacco use disorder  Assessment  Alexander Roberts is a 39 y.o. male admitted: Medicallyfor 09/24/2024  1:19 AM for acute on chronic back pain - shown to have discitis and osteomyelitis of the lumbar region. He carries no psychiatric diagnoses  and has a past medical history of chronic back pain.   His current presentation of low mood, guilt/worthlessness, racing thoughts, irritability, insomnia, and low frustration tolerance is most consistent with adjustment disorder secondary to another medical condition (acute on chronic back pain). He meets criteria for adjustment disorder based on history and timing of symptoms as per DSM V TR. Current outpatient psychotropic medications include none. On initial examination, patient was agreeable to the discussion. He cited significant difficulties in managing his mood given his worsening pain that pre-occupies him. Please see plan below for detailed recommendations.   Diagnoses:  Active Hospital problems: Principal Problem:   Osteomyelitis (HCC) Active Problems:   Discitis of lumbar region   Multilevel spinal stenosis   Tobacco use   Back pain   Discitis   Epidural abscess    Plan   ## Psychiatric Medication Recommendations:  -- Start duloxetine  30 mg for  depression symptoms, chronic pain  -- Titrate upwards in 5 days. Dosage can go as high as 120 mg daily for chronic pain -- Start trazodone  50 mg at bedtime + 50 mg at bedtime PRN for sleep  ## Medical Decision Making Capacity: Not specifically addressed in this encounter  ## Further Work-up:  -- Add on vitamin D , TSH -- most recent EKG on 1/25 had QtC of 417 -- Pertinent labwork reviewed earlier this admission includes: CBC, CMP,    ## Disposition:-- There are no psychiatric contraindications to discharge at this time  ## Behavioral / Environmental: - No specific recommendations at this time.     ## Safety and Observation Level:  - Based on my clinical evaluation, I estimate the patient to be at low risk of self harm in the current setting. - At this time, we recommend  routine. This decision is based on my review of the chart including patient's history and current presentation, interview of the patient, mental status examination, and consideration of suicide risk including evaluating suicidal ideation, plan, intent, suicidal or self-harm behaviors, risk factors, and protective factors. This judgment is based on our ability to directly address suicide risk, implement suicide prevention strategies, and develop a safety plan while the patient is in the clinical setting. Please contact our team if there is a concern that risk level has changed.  CSSR Risk Category:C-SSRS RISK CATEGORY: No Risk  Suicide Risk Assessment: Patient has following modifiable risk factors for suicide: untreated depression, lack of access to outpatient mental health resources, and pain, medical  illness (ie new dx of cancer), which we are addressing by adding multimodal pain control, recommending therapists, group therapy options. Patient has following non-modifiable or demographic risk factors for suicide: male gender Patient has the following protective factors against suicide: Access to outpatient mental health  care, Supportive family, no history of suicide attempts, and no history of NSSIB  Thank you for this consult request. Recommendations have been communicated to the primary team.  We will follow at this time.   Lynwood Morene Lavone Delsie, MD       History of Present Illness  Relevant Aspects of Hospital Course:  Admitted on 09/24/2024 for osteomyelitis of lumbar region. They required laminectomy and surgical debridement for culture..   Patient Report:  Patient reports that he has long dealt with back pain that often impairs his ability to do his work (he is a production designer, theatre/television/film at [employer]), his hobbies (avid fisherman), and his ability to perform normal ADLs. He also cites that this is a problem that has been detrimental to his relationship with his spouse. He says that things have been much worse in the last week: he cites insomnia, poor mood, racing thoughts, preoccupation with symptoms, sense of worthlessness, distractibility.    Psych ROS:  Depression: low mood, anhedonia (and inability), distractibility,  Anxiety:  muscle tension, racing thoughts, insomnia Mania (lifetime and current): never. Insomnia is secondary to pain and racing thoughts. Patient feels exhausted rather than energetic Psychosis: (lifetime and current): denies all other than when he was on high doses of pain medication several days ago.  Collateral information:  Contacted patient's wife at bedside. She spoke with me outside the room and generally corroborated the patient's history as above. She was less concerned about his mood swings than the patient was. She did report that it has been difficult to watch the self-sufficient patient need to ask for help.  Review of Systems  Constitutional:  Positive for weight loss.  Musculoskeletal:  Positive for back pain.  Neurological:  Negative for headaches.  Psychiatric/Behavioral:  Positive for depression. Negative for hallucinations, memory loss, substance abuse and suicidal  ideas. The patient is nervous/anxious and has insomnia.      Psychiatric and Social History  Psychiatric History:  Information collected from patient, wife, chart review  Prev Dx/Sx: none Current Psych Provider: none Home Meds (current): none Previous Med Trials: none Therapy: marriage counseling with wife  Prior Psych Hospitalization: never Prior Self Harm: never Prior Violence: never  Family Psych History: Yes. Pt's father has history of bipolar disorder or schizophrenia. Mother was healthy (but is deceased) Family Hx suicide: denies  Social History:  Developmental Hx: raised in [place] , primarily by grandmother. Father in and out of his life when father not institutionalized in prison or mental hospital Educational Hx: graduated high school Occupational Hx: production designer, theatre/television/film at [employer] Legal Hx: none Living Situation:lives with wife Spiritual Hx: christian, requested chaplain services Access to weapons/lethal means: none  Substance History Alcohol: occasional, <1 week  Type of alcohol: beer Tobacco: yes, frequent smoker, interested in cessation Illicit drugs: infrequent use of CBD oil Prescription drug abuse: denies Rehab hx: denies  Exam Findings  Physical Exam:  Vital Signs:  Temp:  [97.9 F (36.6 C)-98.8 F (37.1 C)] 97.9 F (36.6 C) (01/27 0800) Pulse Rate:  [87-96] 87 (01/27 0800) Resp:  [16-18] 16 (01/27 0800) BP: (107-139)/(68-79) 117/79 (01/27 0800) SpO2:  [97 %-99 %] 99 % (01/27 0800) Weight:  [145.3 kg] 145.3 kg (01/27 0500) Blood pressure 117/79, pulse  87, temperature 97.9 F (36.6 C), resp. rate 16, weight (!) 145.3 kg, SpO2 99%. Body mass index is 40.04 kg/m.  Physical Exam Vitals and nursing note reviewed.  Musculoskeletal:        General: Signs of injury present.  Skin:    General: Skin is warm and dry.  Neurological:     Mental Status: He is alert and oriented to person, place, and time.  Psychiatric:        Attention and Perception:  Attention and perception normal.        Mood and Affect: Affect normal. Mood is anxious and depressed.        Speech: Speech normal.        Behavior: Behavior normal. Behavior is cooperative.        Thought Content: Thought content is not paranoid or delusional. Thought content does not include homicidal or suicidal ideation.        Cognition and Memory: Cognition and memory normal.        Judgment: Judgment normal.     Mental Status Exam: General Appearance: Casual  Orientation:  Full (Time, Place, and Person)  Memory:  Good  Concentration: Normal  Recall: Within normal limits  Attention  Good  Eye Contact:  Good  Speech:  Clear and Coherent and Normal Rate  Language:  Good  Volume:  Increased  Mood:  I am feeling pretty down  Affect: Congruent, anxious, depressed  Thought Process:  Coherent and Linear  Thought Content:  WDL  Suicidal Thoughts:  No  Homicidal Thoughts:  No  Judgement:  Good  Insight:  Good  Psychomotor Activity:  Normal  Akathisia:  No  Fund of Knowledge:  Good      Assets:  Communication Skills Desire for Improvement Financial Resources/Insurance Housing Intimacy Leisure Time Resilience Social Support Vocational/Educational  Cognition:  WNL  ADL's:  Intact  AIMS (if indicated):        Other History   These have been pulled in through the EMR, reviewed, and updated if appropriate.  Family History:  The patient's family history is not on file.  Medical History: Past Medical History:  Diagnosis Date   Medical history non-contributory     Surgical History: Past Surgical History:  Procedure Laterality Date   EXCISION OF TONGUE LESION N/A 10/23/2019   Procedure: RESECTION OF THYROGLOSSAL REMNANT IN BASE OF TONGUE;  Surgeon: Jesus Oliphant, MD;  [facility] OR;  Service: ENT;  Laterality: N/A;   LUMBAR LAMINECTOMY/DECOMPRESSION MICRODISCECTOMY N/A 09/24/2024   Procedure: LUMBAR LAMINECTOMY/DECOMPRESSION MICRODISCECTOMY 1 LEVEL;  Surgeon:  Lehi Alm Hamilton, MD;  Location: [facility] OR;  Service: Neurosurgery;  Laterality: N/A;  Lumbar Three - Four, Lumbar Four - Five Laminectomy   NO PAST SURGERIES       Medications:  Current Medications[1]  Allergies: Allergies[2]  Signed: JINNY Morene GORMAN Delsie, MD [facility] Physician, PGY-2 09/26/2024 4:02 PM     [1]  Current Facility-Administered Medications:    bisacodyl  (DULCOLAX) EC tablet 5 mg, 5 mg, Oral, Daily PRN, Aundra Alm Hamilton, MD, 5 mg at 09/25/24 2052   cefTRIAXone  (ROCEPHIN ) 2 g in sodium chloride  0.9 % 100 mL IVPB, 2 g, Intravenous, Q24H, Manandhar, Sabina, MD, Last Rate: 200 mL/hr at 09/25/24 1723, 2 g at 09/25/24 1723   celecoxib  (CELEBREX ) capsule 200 mg, 200 mg, Oral, Q12H, Kelechi Alm Hamilton, MD, 200 mg at 09/26/24 9185   DAPTOmycin  (CUBICIN ) 900 mg in sodium chloride  0.9 % IVPB, 8 mg/kg (Adjusted), Intravenous, Q1400, Manandhar,  Annalee, MD, Last Rate: 136 mL/hr at 09/25/24 1329, 900 mg at 09/25/24 1329   hydrALAZINE  (APRESOLINE ) injection 10 mg, 10 mg, Intravenous, Q4H PRN, Kei Alm Hamilton, MD   HYDROmorphone  (DILAUDID ) injection 0.5-1 mg, 0.5-1 mg, Intravenous, Q2H PRN, Theodore Alm Hamilton, MD, 1 mg at 09/26/24 0641   ipratropium (ATROVENT ) nebulizer solution 0.5 mg, 0.5 mg, Nebulization, Q6H PRN, Jamarrion Alm Hamilton, MD   menthol  (CEPACOL) lozenge 3 mg, 1 lozenge, Oral, PRN **OR** phenol (CHLORASEPTIC) mouth spray 1 spray, 1 spray, Mouth/Throat, PRN, Edgel Alm Hamilton, MD   methocarbamol  (ROBAXIN ) tablet 500 mg, 500 mg, Oral, Q6H PRN, Kwamane Alm Hamilton, MD, 500 mg at 09/25/24 2336   nicotine  (NICODERM CQ  - dosed in mg/24 hours) patch 14 mg, 14 mg, Transdermal, Daily, Bernell Alm Hamilton, MD, 14 mg at 09/26/24 9183   ondansetron  (ZOFRAN ) tablet 4 mg, 4 mg, Oral, Q6H PRN **OR** ondansetron  (ZOFRAN ) injection 4 mg, 4 mg, Intravenous, Q6H PRN, Kaid Alm Hamilton, MD, 4 mg at 09/24/24 2147   oxyCODONE  (Oxy IR/ROXICODONE ) immediate release tablet 5 mg, 5 mg, Oral, Q3H  PRN, Buddy Alm Hamilton, MD, 5 mg at 09/26/24 9396   senna-docusate (Senokot-S) tablet 1 tablet, 1 tablet, Oral, BID, Beauregard Alm Hamilton, MD, 1 tablet at 09/26/24 0815   sodium chloride  flush (NS) 0.9 % injection 3 mL, 3 mL, Intravenous, Q12H, Domanick Alm Hamilton, MD, 3 mL at 09/25/24 2053   sodium chloride  flush (NS) 0.9 % injection 3 mL, 3 mL, Intravenous, Q12H, Dempsy Alm Hamilton, MD, 3 mL at 09/25/24 2053   sodium chloride  flush (NS) 0.9 % injection 3 mL, 3 mL, Intravenous, Q12H, Ronda Alm Hamilton, MD, 3 mL at 09/25/24 2053   sodium chloride  flush (NS) 0.9 % injection 3 mL, 3 mL, Intravenous, PRN, Issaih Alm Hamilton, MD [2] No Known Allergies

## 2024-09-26 NOTE — Progress Notes (Signed)
 "        [facility]  Date of Admission:  09/24/2024      Total days of antibiotics 2           ASSESSMENT: Berthold Glace is a 39 y.o. male   Streptococcal Bacteremia -  BCX + 1/24 growing out streptococcus intermedius. No cardiac device or prosthetic valves. Should match what is growing in back.  FU repeated blood cultures drawn 1/27 Continue Daptomycin  + Ceftriaxone  until MIC back. 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 Intra-op cultures with gram positive cocci on stain, cultures pending but growing something preliminarily.  Op Note revealed significant pus in ventral epidural space. NSGY following. He is still having significant pain despite multimodal approach. Getting IV dilauded Q2hr around the clock essentially.   Medical Leave -  Typically ID would help complete FMLA however given his condition required surgery we need to defer to NSGY for assistance and recommendations for leave and any restrictions. I reached out to the office for them re: the process to help get them started. Information provided to his wife.   Disposition -  CIR being considered for his rehab    PLAN: Continue Daptomycin  + Ceftriaxone  until MIC back. Likely Ceftriaxone  monotherapy OK  FU repeated blood cultures drawn 1/27 Pain  management per primary team    Principal Problem:   Osteomyelitis [facility]) Active Problems:   Discitis of lumbar region   Multilevel spinal stenosis   Tobacco use   Back pain   Discitis   Epidural abscess    celecoxib   200 mg Oral Q12H   nicotine   14 mg Transdermal Daily   senna-docusate  1 tablet Oral BID   sodium chloride  flush  3 mL Intravenous Q12H   sodium chloride  flush  3 mL Intravenous Q12H   sodium chloride  flush  3 mL Intravenous Q12H    SUBJECTIVE: Still having numbness in buttocks. Small  steps up around the room.  Very stressed about FMLA and job protection - wondering who can help complete these forms for him as he does not have a primary care provider.   Blood cultures are growing now.   Review of Systems: Review of Systems  Constitutional:  Negative for chills and fever.  Musculoskeletal:  Positive for back pain.    Allergies[1]  OBJECTIVE: Vitals:   09/25/24 2051 09/26/24 0401 09/26/24 0500 09/26/24 0800  BP: 117/70 107/68  117/79  Pulse: 93 96  87  Resp: 18 18  16   Temp: 98.4 F (36.9 C) 97.9 F (36.6 C)  97.9 F (36.6 C)  TempSrc: Oral Oral    SpO2: 99% 99%  99%  Weight:   (!) 145.3 kg    Body mass index is 40.04 kg/m.  Physical Exam Eyes:     Pupils: Pupils are equal, round, and reactive to light.  Cardiovascular:     Rate and Rhythm: Normal rate and regular rhythm.     Heart sounds: No murmur heard. Pulmonary:     Effort: Pulmonary effort is normal.     Breath sounds: Normal breath sounds.  Abdominal:     General: Bowel sounds are normal. There is no distension.     Palpations: Abdomen is soft.  Musculoskeletal:        General: Normal range of motion.     Cervical back: Normal range  of motion.  Skin:    General: Skin is warm and dry.     Capillary Refill: Capillary refill takes less than 2 seconds.     Findings: No rash.  Neurological:     Mental Status: He is alert and oriented to person, place, and time.     Sensory: Sensory deficit present.     Motor: Weakness present.     Lab Results Lab Results  Component Value Date   WBC 10.7 (H) 09/26/2024   HGB 11.6 (L) 09/26/2024   HCT 33.8 (L) 09/26/2024   MCV 81.6 09/26/2024   PLT 403 (H) 09/26/2024    Lab Results  Component Value Date   CREATININE 0.70 09/26/2024   BUN 13 09/26/2024   NA 132 (L) 09/26/2024   K 4.1 09/26/2024   CL 96 (L) 09/26/2024   CO2 24 09/26/2024    Lab Results  Component Value Date   ALT 30 09/24/2024   AST 17 09/24/2024   ALKPHOS 106 09/24/2024    BILITOT 0.6 09/24/2024     Microbiology: Recent Results (from the past 240 hours)  Blood Culture (routine x 2)     Status: None (Preliminary result)   Collection Time: 09/24/24  1:54 AM   Specimen: BLOOD  Result Value Ref Range Status   Specimen Description   Final    BLOOD RIGHT ANTECUBITAL Performed at [facility], [address] [place], [place] [address]    Special Requests   Final    BOTTLES DRAWN AEROBIC AND ANAEROBIC Blood Culture adequate volume Performed at [facility], [address] [place], [place] [address]    Culture  Setup Time   Final    [facility] POSITIVE COCCI ANAEROBIC BOTTLE ONLY MAYA CHARLENA SLADE [id number] @ 1948 FH Performed at [facility], [address], [place], [place] [address]    Culture GRAM POSITIVE COCCI  Final   Report Status PENDING  Incomplete  Blood Culture (routine x 2)     Status: None (Preliminary result)   Collection Time: 09/24/24  1:54 AM   Specimen: BLOOD  Result Value Ref Range Status   Specimen Description   Final    BLOOD LEFT ANTECUBITAL Performed at [facility], [address] [place], [place] [address]    Special Requests   Final    BOTTLES DRAWN AEROBIC AND ANAEROBIC Blood Culture adequate volume Performed at [facility], [address] [place], [place] [address]    Culture  Setup Time   Final    GRAM POSITIVE COCCI ANAEROBIC BOTTLE ONLY CRITICAL VALUE NOTED.  VALUE IS CONSISTENT WITH PREVIOUSLY REPORTED AND CALLED VALUE. Performed at [facility], [address] [place], [place] [address]    Culture GRAM POSITIVE COCCI  Final   Report Status PENDING  Incomplete  Blood Culture ID Panel (Reflexed)     Status: Abnormal   Collection Time: 09/24/24  1:54 AM  Result Value Ref Range Status   Enterococcus faecalis NOT DETECTED NOT DETECTED Final   Enterococcus Faecium NOT DETECTED NOT DETECTED Final   Listeria monocytogenes NOT DETECTED NOT DETECTED Final    Staphylococcus species NOT DETECTED NOT DETECTED Final   Staphylococcus aureus (BCID) NOT DETECTED NOT DETECTED Final   Staphylococcus epidermidis NOT DETECTED NOT DETECTED Final   Staphylococcus lugdunensis NOT DETECTED NOT DETECTED Final   Streptococcus species DETECTED (A) NOT DETECTED Final    Comment: Not Enterococcus species, Streptococcus  agalactiae, Streptococcus pyogenes, or Streptococcus pneumoniae. CRITICAL RESULT CALLED TO, READ BACK BY AND VERIFIED WITH: PHARMD ESABRA SLADE [id number] @ 1948 FH    Streptococcus agalactiae NOT DETECTED NOT DETECTED Final   Streptococcus pneumoniae NOT DETECTED NOT DETECTED Final   Streptococcus pyogenes NOT DETECTED NOT DETECTED Final   A.calcoaceticus-baumannii NOT DETECTED NOT DETECTED Final   Bacteroides fragilis NOT DETECTED NOT DETECTED Final   Enterobacterales NOT DETECTED NOT DETECTED Final   Enterobacter cloacae complex NOT DETECTED NOT DETECTED Final   Escherichia coli NOT DETECTED NOT DETECTED Final   Klebsiella aerogenes NOT DETECTED NOT DETECTED Final   Klebsiella oxytoca NOT DETECTED NOT DETECTED Final   Klebsiella pneumoniae NOT DETECTED NOT DETECTED Final   Proteus species NOT DETECTED NOT DETECTED Final   Salmonella species NOT DETECTED NOT DETECTED Final   Serratia marcescens NOT DETECTED NOT DETECTED Final   Haemophilus influenzae NOT DETECTED NOT DETECTED Final   Neisseria meningitidis NOT DETECTED NOT DETECTED Final   Pseudomonas aeruginosa NOT DETECTED NOT DETECTED Final   Stenotrophomonas maltophilia NOT DETECTED NOT DETECTED Final   Candida albicans NOT DETECTED NOT DETECTED Final   Candida auris NOT DETECTED NOT DETECTED Final   Candida glabrata NOT DETECTED NOT DETECTED Final   Candida krusei NOT DETECTED NOT DETECTED Final   Candida parapsilosis NOT DETECTED NOT DETECTED Final   Candida tropicalis NOT DETECTED NOT DETECTED Final   Cryptococcus neoformans/gattii NOT DETECTED NOT DETECTED Final    Comment: Performed  at [facility], [address] [place], [place] [address]  Surgical pcr screen     Status: None   Collection Time: 09/24/24 12:32 PM   Specimen: Nasal Mucosa; Nasal Swab  Result Value Ref Range Status   MRSA, PCR NEGATIVE NEGATIVE Final   Staphylococcus aureus NEGATIVE NEGATIVE Final    Comment: (NOTE) The Xpert SA Assay (FDA approved for NASAL specimens in patients 71 years of age and older), is one component of a comprehensive surveillance program. It is not intended to diagnose infection nor to guide or monitor treatment. Performed at [facility], [address], [place], [place] [address]   Aerobic/Anaerobic Culture w Gram Stain (surgical/deep wound)     Status: None (Preliminary result)   Collection Time: 09/24/24  2:14 PM   Specimen: Abscess  Result Value Ref Range Status   Specimen Description ABSCESS  Final   Special Requests LUMBAR FOUR FIVE EPIDURAL ABSCESS  Final   Gram Stain   Final    RARE WBC PRESENT, PREDOMINANTLY PMN RARE GRAM POSITIVE COCCI    Culture   Final    CULTURE REINCUBATED FOR BETTER GROWTH Performed at [facility], [address], [place], [place] [address]    Report Status PENDING  Incomplete  Aerobic/Anaerobic Culture w Gram Stain (surgical/deep wound)     Status: None (Preliminary result)   Collection Time: 09/24/24  2:21 PM   Specimen: Abscess  Result Value Ref Range Status   Specimen Description ABSCESS  Final   Special Requests LUMBAR FOUR FIVE EPIDURAL ABSCESS  Final   Gram Stain NO WBC SEEN NO ORGANISMS SEEN   Final   Culture   Final    NO GROWTH < 24 HOURS Performed at [facility], [address] [place], [place] [address]    Report Status PENDING  Incomplete     Corean Fireman, MSN, NP-C [facility]  [email or web address] Pager: [phone] Office: [phone] [facility] Main Line:  [phone] *Secure Chat Communication Welcome       [1] No Known Allergies  "

## 2024-09-26 NOTE — Progress Notes (Signed)
 Neurosurgery  Patient seen an examined. Sitting in reclining chair. States pain is better than prior to surgery. Has some persistent numbness in the left leg, less sound in right leg. Hip flexion limited by pain, limited by pain, Knee extension 4+/5 limited by pain, 4+/5 DF, 5/5 PF bilaterally.  S/p lami for lumbar discitis/EDA - cont atbx - PT/OT

## 2024-09-26 NOTE — Progress Notes (Signed)
 Physical Therapy Treatment Patient Details Name: Alexander Roberts MRN: [id number] DOB: 1985-09-10 Today's Date: 09/26/2024   History of Present Illness Pt is a 38 y.o. 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 S/p decompressive lumbar laminectomy, medial facetectomy foraminotomies L3-4 and L4-5 with evacuation of epidural abscess 1/25. Significant PMH: none.    PT Comments  Pt making steady progress towards his physical therapy goals and is motivated to participate. Premedicated prior to session. Pt able to perform bed level AROM/AAROM exercises for LLE strengthening. Continues to have deficits in sensation/proprioception and LLE proximal weakness. Pt requiring moderate assist with transfers and ambulating 5 ft with RW and step to gait pattern. Patient will benefit from intensive inpatient follow-up therapy, >3 hours/day.    If plan is discharge home, recommend the following: A lot of help with walking and/or transfers;A lot of help with bathing/dressing/bathroom   Can travel by private vehicle        Equipment Recommendations  BSC/3in1    Recommendations for Other Services Rehab consult     Precautions / Restrictions Precautions Precautions: Fall;Back Precaution Booklet Issued: No Recall of Precautions/Restrictions: Intact Precaution/Restrictions Comments: Hemovac Restrictions Weight Bearing Restrictions Per Provider Order: No     Mobility  Bed Mobility Overal bed mobility: Needs Assistance Bed Mobility: Rolling, Sidelying to Sit Rolling: Contact guard assist Sidelying to sit: Mod assist       General bed mobility comments: Reviewed log roll technique. Pt transitioning to sidelying with increased time and use of bed rail. Assist to bring LLE off edge of bed and trunk to elevate to sitting position     Transfers Overall transfer level: Needs assistance Equipment used: Rolling walker (2 wheels) Transfers: Sit to/from Stand, Bed to chair/wheelchair/BSC Sit to Stand: Mod assist           General transfer comment: PT stabilizing walker while pt pulled up to stand with modA.    Ambulation/Gait Ambulation/Gait assistance: Min assist Gait Distance (Feet): 5 Feet Assistive device: Rolling walker (2 wheels) Gait Pattern/deviations: Step-to pattern, Decreased dorsiflexion - left, Decreased stance time - left Gait velocity: decreased Gait velocity interpretation: <1.8 ft/sec, indicate of risk for recurrent falls   General Gait Details: Verbal cues for sequencing/technique, heavy reliance through BUE's on walker, step to pattern.   Stairs             Wheelchair Mobility     Tilt Bed    Modified Rankin (Stroke Patients Only)       Balance Overall balance assessment: Needs assistance Sitting-balance support: Feet supported Sitting balance-Leahy Scale: Fair Sitting balance - Comments: Reliant on BUE support due to pain   Standing balance support: Bilateral upper extremity supported Standing balance-Leahy Scale: Poor                              Communication Communication Communication: No apparent difficulties  Cognition Arousal: Alert Behavior During Therapy: WFL for tasks assessed/performed   PT - Cognitive impairments: No apparent impairments                         Following commands: Intact      Cueing Cueing Techniques: Verbal cues  Exercises General Exercises - Lower Extremity Quad Sets: AROM, Left, 10 reps, Supine Heel Slides:  AAROM, Left, 5 reps, Supine Hip ABduction/ADduction: AROM, Left, 5 reps, Supine    General Comments        Pertinent Vitals/Pain Pain Assessment Pain Assessment: Faces Faces Pain Scale: Hurts whole lot Pain Descriptors / Indicators: Operative site guarding, Grimacing, Moaning Pain  Intervention(s): Limited activity within patient's tolerance, Monitored during session, Premedicated before session    Home Living                          Prior Function            PT Goals (current goals can now be found in the care plan section) Acute Rehab PT Goals Patient Stated Goal: to return to work, fishing PT Goal Formulation: With patient Time For Goal Achievement: 10/09/24 Potential to Achieve Goals: Good Progress towards PT goals: Progressing toward goals    Frequency    Min 5X/week      PT Plan      Co-evaluation              AM-PAC PT 6 Clicks Mobility   Outcome Measure  Help needed turning from your back to your side while in a flat bed without using bedrails?: A Lot Help needed moving from lying on your back to sitting on the side of a flat bed without using bedrails?: A Lot Help needed moving to and from a bed to a chair (including a wheelchair)?: A Lot Help needed standing up from a chair using your arms (e.g., wheelchair or bedside chair)?: A Lot Help needed to walk in hospital room?: Total Help needed climbing 3-5 steps with a railing? : Total 6 Click Score: 10    End of Session Equipment Utilized During Treatment: Gait belt Activity Tolerance: Patient tolerated treatment well Patient left: in chair;with call bell/phone within reach;with family/visitor present Nurse Communication: Mobility status PT Visit Diagnosis: Unsteadiness on feet (R26.81);Difficulty in walking, not elsewhere classified (R26.2);Pain Pain - part of body:  (back)     Time: 8876-8845 PT Time Calculation (min) (ACUTE ONLY): 31 min  Charges:    $Therapeutic Activity: 23-37 mins PT General Charges $$ ACUTE PT VISIT: 1 Visit                     Alexander Roberts, PT, DPT Acute Rehabilitation Services Office [phone]    Alexander Roberts 09/26/2024, 3:21 PM

## 2024-09-26 NOTE — Progress Notes (Signed)
 Chaplain attempted visit to offer prayer per [facility] consult. Pt had two visitors and accepted when chaplain attempted to follow up later. Chaplain notes that Elia Shuck had just visited with pt.   No further needs identified at this time.  Alan HERO. Davee Lomax, M.Div. [facility] Chaplain Pager [phone] Office [phone]

## 2024-09-26 NOTE — Progress Notes (Signed)
" ° ° °  Inpatient Rehabilitation Admissions Coordinator   Met with patient and spouse at bedside for rehab assessment. We discussed goals and expectations of a possible CIR admit. They prefer CIR for rehab. Family can provide expected caregiver support that is recommended. Wife will take FMLA. . I will begin insurance Auth with [employer] for possible CIR admit pending approval. Please call me with any questions.   Heron Leavell, RN, MSN Rehab Admissions Coordinator [phone]   "

## 2024-09-26 NOTE — Progress Notes (Addendum)
 " PROGRESS NOTE    Alexander Roberts  FMW:[id number] DOB: [birth year] DOA: 09/24/2024 PCP: Bon[place], [place]  E, PA-C   Brief Narrative:  39 year old male with no significant past medical history With exception of chronic back pain presenting with worsening back pain over the past few days with worsening weakness in the left leg.   MRI of the spine showed discitis osteomyelitis at L4-L5, epidural fluid collection consistent with epidural abscess on the bottom of L2-L3 down to under the L5 lamina on the left at L4-L5 on the right, significant spinal stenosis L3-L4 and L4-L5  Neurosurgery consulted.  Status post  Decompressive lumbar laminectomy, medial facetectomy foraminotomies L3-4 and L4-5 with evacuation of epidural abscess done on 09/24/2024  On broad-spectrum intravenous antibiotics, awaiting culture results, de-escalate antibiotics once appropriate.   He has been screened for CIR candidacy and is a potential candidate.   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  He is a potential candidate for CIR - Follow-up culture results.  Gram stain with rare GPC.  Infectious disease on board.  Patient is currently on intravenous Daptomycin  and Ceftriaxone . Follow-up blood cultures - Continue with 0.5 to 1 mg of Dilaudid  every 2 hours for severe pain  Strep intermedius bacteremia,POA: On IV ceftriaxone  and Daptomycin . F/u final culture  results.  Tobacco use Counseled regarding smoking cessation -continue with nicotine  patch   Multilevel spinal stenosis Acute on chronic back pain with a chronic history of multilevel spinal stenosis -with some inflammation - With new onset weakness and paresthesia -Neurosurgery on board, appreciate assistance.  Status post surgery. - Continue with intravenous Dilaudid  for severe pain   Disposition: He is a potential candidate for CIR.  DVT prophylaxis: SCD's Start: 09/24/24 1820 Place and maintain sequential compression device Start: 09/24/24 0835 Place TED hose Start: 09/24/24 0835     Code Status: Full Code Family Communication:  Wife at the bedside Status is: Inpatient Remains inpatient appropriate because: Decompressive lumbar laminectomy, medial facetectomy foraminotomies L3-4 and L4-5 with evacuation of epidural abscess     Subjective:  No acute events overnight. Still complaining of numbness in buttocks and b/l feet. Appetite is not good. Spoke to him about pysch consultation because of his depressive symptoms. No suicidal or homicidal ideations.  Examination:  General exam: Appears calm and comfortable  Respiratory system: Clear to auscultation. Respiratory effort normal. Cardiovascular system: S1 & S2 heard, RRR. No JVD, murmurs, rubs, gallops or clicks. No pedal edema. Gastrointestinal system: Abdomen is nondistended, soft and nontender. No organomegaly or masses felt. Normal bowel sounds heard. Central nervous system: Alert and oriented.  Extremities: Slightly decreased power in left leg as compared to the right leg Skin: No rashes, lesions or ulcers Psychiatry: Judgement and insight appear normal. Mood & affect appropriate.      Diet Orders (From admission, onward)     Start     Ordered   09/24/24 1851  Diet regular Room service appropriate? Yes; Fluid consistency: Thin  Diet effective now       Question Answer Comment  Room service appropriate? Yes    Fluid consistency: Thin      09/24/24 1850            Objective: Vitals:   09/25/24 2051 09/26/24 0401 09/26/24 0500 09/26/24 0800  BP: 117/70 107/68  117/79  Pulse: 93 96  87  Resp: 18 18  16   Temp: 98.4 F (36.9 C) 97.9 F (36.6 C)  97.9 F (36.6 C)  TempSrc: Oral Oral    SpO2: 99% 99%  99%  Weight:   (!) 145.3 kg     Intake/Output Summary (Last 24 hours) at 09/26/2024 0854 Last data filed at 09/26/2024 0400 Gross per 24 hour  Intake 449.7 ml  Output 540 ml  Net -90.3 ml   Filed Weights   09/26/24 0500  Weight: (!) 145.3 kg    Scheduled Meds:  celecoxib   200 mg Oral Q12H   nicotine   14 mg Transdermal Daily   senna-docusate  1 tablet Oral BID   sodium chloride  flush  3 mL Intravenous Q12H   sodium chloride  flush  3 mL Intravenous Q12H   sodium chloride  flush  3 mL Intravenous Q12H   Continuous Infusions:  cefTRIAXone  (ROCEPHIN )  IV 2 g (09/25/24 1723)   DAPTOmycin  900 mg (09/25/24 1329)    Nutritional status     Body mass index is 40.04 kg/m.  Data Reviewed:   CBC: Recent Labs  Lab 09/24/24 0156 09/25/24 0413 09/26/24 0555  WBC 13.5* 15.5* 10.7*  NEUTROABS 10.4* 13.5* 7.4  HGB 12.7* 11.5* 11.6*  HCT 36.9* 32.6* 33.8*  MCV 80.7 79.1* 81.6  PLT 330 347 403*   Basic Metabolic Panel: Recent Labs  Lab 09/24/24 0156 09/24/24 0751 09/25/24 0413 09/26/24 0555  NA 135  --  131* 132*  K 4.3  --  4.3 4.1  CL 96*  --  93* 96*  CO2 27  --  26 24  GLUCOSE 110*  --  117* 89  BUN 11  --  13 13  CREATININE 0.78 0.72 0.76 0.70  CALCIUM  10.2  --  9.6 9.3  MG  --  1.7  --   --   PHOS  --  4.3  --   --    GFR: Estimated Creatinine Clearance: 192.7 mL/min (by C-G formula based on SCr of 0.7 mg/dL). Liver Function Tests: Recent Labs  Lab 09/24/24 0156  AST 17  ALT 30  ALKPHOS 106  BILITOT 0.6  PROT 7.3  ALBUMIN 3.7   No results for input(s): LIPASE, AMYLASE in the last 168 hours. No results for input(s): AMMONIA in the last 168  hours. Coagulation Profile: Recent Labs  Lab 09/24/24 0156 09/25/24 0413  INR 1.0 1.1   Cardiac Enzymes: Recent Labs  Lab 09/26/24 0555  CKTOTAL 92   BNP (last 3 results) No results for input(s): PROBNP in the last 8760 hours. HbA1C: No results for input(s): HGBA1C in the last 72 hours. CBG: Recent Labs  Lab 09/24/24 0847 09/25/24 0740 09/25/24 1218 09/25/24 1627 09/26/24 0755  GLUCAP 97 101* 132* 101* 103*   Lipid Profile: No results for input(s): CHOL, HDL, LDLCALC, TRIG, CHOLHDL, LDLDIRECT in the last 72 hours. Thyroid Function Tests: No results for input(s): TSH, T4TOTAL, FREET4, T3FREE, THYROIDAB in the last 72 hours. Anemia Panel: No results for input(s): VITAMINB12, FOLATE, FERRITIN, TIBC, IRON, RETICCTPCT in the last 72 hours. Sepsis Labs: Recent Labs  Lab 09/24/24 0156 09/24/24  0751  PROCALCITON  --  0.24  LATICACIDVEN 0.9  --     Recent Results (from the past 240 hours)  Blood Culture (routine x 2)     Status: None (Preliminary result)   Collection Time: 09/24/24  1:54 AM   Specimen: BLOOD  Result Value Ref Range Status   Specimen Description   Final    BLOOD RIGHT ANTECUBITAL Performed at [facility], [address] [place], [place] [address]    Special Requests   Final    BOTTLES DRAWN AEROBIC AND ANAEROBIC Blood Culture adequate volume Performed at [facility], [address], [place], [place] [address]    Culture  Setup Time   Final    [facility] POSITIVE COCCI ANAEROBIC BOTTLE ONLY MAYA CHARLENA SLADE [id number] @ 1948 FH Performed at [facility], [address] [place], [place] [address]    Culture GRAM POSITIVE COCCI  Final   Report Status PENDING  Incomplete  Blood Culture (routine x 2)     Status: None (Preliminary result)   Collection Time: 09/24/24  1:54 AM   Specimen: BLOOD  Result Value Ref Range Status   Specimen Description   Final    BLOOD LEFT  ANTECUBITAL Performed at [facility], [address] [place], [place] [address]    Special Requests   Final    BOTTLES DRAWN AEROBIC AND ANAEROBIC Blood Culture adequate volume Performed at [facility], [address] [place], [place] [address]    Culture  Setup Time   Final    GRAM POSITIVE COCCI ANAEROBIC BOTTLE ONLY CRITICAL VALUE NOTED.  VALUE IS CONSISTENT WITH PREVIOUSLY REPORTED AND CALLED VALUE. Performed at [facility], [address] [place], [place] [address]    Culture GRAM POSITIVE COCCI  Final   Report Status PENDING  Incomplete  Blood Culture ID Panel (Reflexed)     Status: Abnormal   Collection Time: 09/24/24  1:54 AM  Result Value Ref Range Status   Enterococcus faecalis NOT DETECTED NOT DETECTED Final   Enterococcus Faecium NOT DETECTED NOT DETECTED Final   Listeria monocytogenes NOT DETECTED NOT DETECTED Final   Staphylococcus species NOT DETECTED NOT DETECTED Final   Staphylococcus aureus (BCID) NOT DETECTED NOT DETECTED Final   Staphylococcus epidermidis NOT DETECTED NOT DETECTED Final   Staphylococcus lugdunensis NOT DETECTED NOT DETECTED Final   Streptococcus species DETECTED (A) NOT DETECTED Final    Comment: Not Enterococcus species, Streptococcus agalactiae, Streptococcus pyogenes, or Streptococcus pneumoniae. CRITICAL RESULT CALLED TO, READ BACK BY AND VERIFIED WITH: PHARMD ESABRA SLADE [id number] @ 1948 FH    Streptococcus agalactiae NOT DETECTED NOT DETECTED Final   Streptococcus pneumoniae NOT DETECTED NOT DETECTED Final   Streptococcus pyogenes NOT DETECTED NOT DETECTED Final   A.calcoaceticus-baumannii NOT DETECTED NOT DETECTED Final   Bacteroides fragilis NOT DETECTED NOT DETECTED Final   Enterobacterales NOT DETECTED NOT DETECTED Final   Enterobacter cloacae complex NOT DETECTED NOT DETECTED Final   Escherichia coli NOT DETECTED NOT DETECTED Final   Klebsiella aerogenes NOT DETECTED NOT DETECTED Final   Klebsiella  oxytoca NOT DETECTED NOT DETECTED Final   Klebsiella pneumoniae NOT DETECTED NOT DETECTED Final   Proteus species NOT DETECTED NOT DETECTED Final   Salmonella species NOT DETECTED NOT DETECTED Final   Serratia marcescens NOT DETECTED NOT DETECTED Final   Haemophilus influenzae NOT DETECTED NOT DETECTED Final   Neisseria meningitidis NOT DETECTED NOT DETECTED  Final   Pseudomonas aeruginosa NOT DETECTED NOT DETECTED Final   Stenotrophomonas maltophilia NOT DETECTED NOT DETECTED Final   Candida albicans NOT DETECTED NOT DETECTED Final   Candida auris NOT DETECTED NOT DETECTED Final   Candida glabrata NOT DETECTED NOT DETECTED Final   Candida krusei NOT DETECTED NOT DETECTED Final   Candida parapsilosis NOT DETECTED NOT DETECTED Final   Candida tropicalis NOT DETECTED NOT DETECTED Final   Cryptococcus neoformans/gattii NOT DETECTED NOT DETECTED Final    Comment: Performed at [facility], [address], [place], [place] [address]  Surgical pcr screen     Status: None   Collection Time: 09/24/24 12:32 PM   Specimen: Nasal Mucosa; Nasal Swab  Result Value Ref Range Status   MRSA, PCR NEGATIVE NEGATIVE Final   Staphylococcus aureus NEGATIVE NEGATIVE Final    Comment: (NOTE) The Xpert SA Assay (FDA approved for NASAL specimens in patients 40 years of age and older), is one component of a comprehensive surveillance program. It is not intended to diagnose infection nor to guide or monitor treatment. Performed at [facility], [address] [place], [place] [address]   Aerobic/Anaerobic Culture w Gram Stain (surgical/deep wound)     Status: None (Preliminary result)   Collection Time: 09/24/24  2:14 PM   Specimen: Abscess  Result Value Ref Range Status   Specimen Description ABSCESS  Final   Special Requests LUMBAR FOUR FIVE EPIDURAL ABSCESS  Final   Gram Stain   Final    RARE WBC PRESENT, PREDOMINANTLY PMN RARE GRAM POSITIVE COCCI    Culture   Final    CULTURE  REINCUBATED FOR BETTER GROWTH Performed at [facility], [address] [place], [place] [address]    Report Status PENDING  Incomplete  Aerobic/Anaerobic Culture w Gram Stain (surgical/deep wound)     Status: None (Preliminary result)   Collection Time: 09/24/24  2:21 PM   Specimen: Abscess  Result Value Ref Range Status   Specimen Description ABSCESS  Final   Special Requests LUMBAR FOUR FIVE EPIDURAL ABSCESS  Final   Gram Stain NO WBC SEEN NO ORGANISMS SEEN   Final   Culture   Final    NO GROWTH < 24 HOURS Performed at [facility], [address], [place], [place] [address]    Report Status PENDING  Incomplete         Radiology Studies: DG Lumbar Spine 1 View Result Date: 09/24/2024 CLINICAL DATA:  Elective surgery. Lumbar laminectomy/decompression with micro discectomy, 1 level. EXAM: LUMBAR SPINE - 1 VIEW COMPARISON:  09/17/2024. FINDINGS: There is no evidence of acute lumbar spine fracture. Alignment is normal. Multilevel intervertebral disc space narrowing, degenerative endplate changes, and facet arthropathy are noted. A needle is present posterior to L5-S1 on image 1. There is a curvilinear structure posterior to L4-L5 on image 2. 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 Electronically Signed   By: Leita Birmingham M.D.   On: 09/24/2024 16:12   MR Lumbar Spine W Wo Contrast Result Date: 09/24/2024 EXAM: MRI LUMBAR SPINE 09/24/2024 02:37:07 PM TECHNIQUE: Multiplanar multisequence MRI of the lumbar spine was performed without and with the administration of intravenous contrast. COMPARISON: 07/31/2025 CLINICAL HISTORY: Low back pain, cauda equina syndrome suspected; Low back pain, infection suspected, positive X-ray/computed tomography. FINDINGS: BONES AND ALIGNMENT: Straightening of the lumbar lordosis. Normal vertebral body heights. There is increased edema throughout the L4 and L5 vertebral bodies. There are areas of signal  abnormality and  irregularity along the endplates, also increased from prior findings, suggestive of discitis and osteomyelitis. Additional signal abnormality along the L5 inferior endplate. Focal subtle signal abnormality at the posterior aspect of the S1 superior endplate which is new from prior findings are concerning for discitis osteomyelitis also at the L5-S1 level. Degenerative endplate changes posteriorly at L2-L3. Additional Modic type 2 degenerative endplate changes at L5-S1. 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 SPINAL CORD: The conus terminates normally. SOFT TISSUES: There is prominent edema within the paraspinal musculature particularly at L4 and L5 on the right and at L3 through S1 on the left. There is edema noted within the psoas musculature bilaterally. There is a partially visualized paraspinal collection along the right paraspinal soft tissues at the L4-L5 level concerning for paraspinal abscess seen on series 6 image 1 and series 15 image 1. Possible additional smaller paraspinal abscess on the right at the L5 level anterior and inferior to the larger collection. Additional partially visualized enhancing paraspinal collections along left aspect of L5-S1 involving the lower aspect of the psoas musculature at this level series 15 image 17 and series 16 image 44. T12-L1: There is minimal disc bulge. No significant spinal canal or foraminal stenosis. L1-L2: There is minimal disc bulge and mild facet arthrosis. No significant spinal canal or foraminal stenosis. L2-L3: There is a diffuse disc bulge. Bilateral facet arthrosis. Prominent epidural fat. Congenital short pedicles. Similar severe spinal canal stenosis. Mild bilateral foraminal stenosis. Small annular fissure noted. L3-L4: There is a diffuse disc bulge. Moderate facet arthrosis. Prominent epidural fat. Congenital short pedicles. Additional contribution to spinal canal stenosis from epidural abscess. Similar severe spinal canal stenosis.  Mild bilateral facet arthrosis. Additional areas of prominent epidural soft tissue and enhancement along the posterior aspect of L3 which is increased from prior. Within this region there is a 0.9 cm enhancing collection concerning for epidural abscess series 16 image 28. Subtle signal abnormality in the posterior aspect of L3 with edema and mild enhancement concerning for additional focus of osteomyelitis. There is also subtle signal abnormality in the L3-L4 disc which appears new from prior and may reflect additional areas of discitis. L4-L5: There is a disc bulge and posterior osteophytes. Bilateral facet arthrosis. Prominence of the epidural fat. Epidural abscess also contributing to spinal canal stenosis. Similar severe spinal canal stenosis. There is moderate to severe bilateral foraminal stenosis. There is increased abnormal fluid within the L4-L5 disc space. There are areas of signal abnormality and irregularity along the endplates, also increased from prior findings, suggestive of discitis and osteomyelitis. There is prominent abnormal soft tissue within the ventral epidural fat along the posterior aspect of L4 and L5. There is edema and abnormal enhancement within this region. At the L4 level there is a small hypointense focus with peripheral enhancement right of midline just above the level of the disc which measures up to 0.4 cm which is concerning for epidural abscess series 16 image 36. Additional prominent epidural soft tissue at L5 also with a peripherally enhancing component at midline along the mid aspect of L5 which measures 0.8 x 0.7 cm concerning for additional epidural abscess series 16 image 4. L5-S1: There is a diffuse disc bulge and posterior osteophytes. Bilateral facet arthrosis. Epidural abscess also contributes to spinal canal stenosis. There is similar appearance of moderate spinal canal stenosis and lateral recess narrowing. There is severe right and moderate to severe left foraminal  stenosis. There is slightly increased signal abnormality within  the L5-S1 disc. Focal subtle signal abnormality at the posterior aspect of the S1 superior endplate which is new from prior findings are concerning for discitis osteomyelitis also at the L5-S1 level. VISUALIZED ABDOMEN AND PELVIS: There are areas of diverticuli along the partially visualized sigmoid colon. Mildly distended urinary bladder. Cyst in the posterior aspect of the left kidney. LIMITATIONS/ARTIFACTS: Axial images are limited by motion artifact. IMPRESSION: 1. 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 2. Findings consistent with discitis and osteomyelitis at L4-L5 and L5-S1 levels, with additional concern for discitis at L3-L4. Possible osteomyelitis in the posterior L3 vertebral body. 3. Paraspinal and psoas muscle edema with multiple collections concerning for abscesses, including a partially visualized right paraspinal collection at L4-L5 and left paraspinal/psoas collections at L5-S1. 4. Severe spinal canal stenosis at L2-L3, L3-L4, and L4-L5 levels, with severe right and moderate to severe left foraminal stenosis at L5-S1. Electronically signed by: Donnice Mania MD 09/24/2024 03:17 PM EST RP Workstation: [id number]           LOS: 2 days   Time spent= 35 mins    Deliliah Room, MD [facility]  If 7PM-7AM, please contact night-coverage  09/26/2024, 8:54 AM  "

## 2024-09-27 ENCOUNTER — Inpatient Hospital Stay (HOSPITAL_COMMUNITY)

## 2024-09-27 DIAGNOSIS — R7881 Bacteremia: Secondary | ICD-10-CM

## 2024-09-27 DIAGNOSIS — M869 Osteomyelitis, unspecified: Secondary | ICD-10-CM | POA: Diagnosis not present

## 2024-09-27 LAB — CULTURE, BLOOD (ROUTINE X 2)
Special Requests: ADEQUATE
Special Requests: ADEQUATE

## 2024-09-27 LAB — ECHOCARDIOGRAM COMPLETE
AR max vel: 2.11 cm2
AV Area VTI: 2.56 cm2
AV Area mean vel: 2.35 cm2
AV Mean grad: 7 mmHg
AV Peak grad: 16.2 mmHg
Ao pk vel: 2.01 m/s
Area-P 1/2: 2.91 cm2
Calc EF: 68 %
Height: 75 in
S' Lateral: 2.9 cm
Single Plane A2C EF: 62 %
Single Plane A4C EF: 74.5 %
Weight: 5157 [oz_av]

## 2024-09-27 LAB — GLUCOSE, CAPILLARY: Glucose-Capillary: 110 mg/dL — ABNORMAL HIGH (ref 70–99)

## 2024-09-27 MED ORDER — KETOROLAC TROMETHAMINE 15 MG/ML IJ SOLN: 15.0000 mg | Freq: Once | INTRAMUSCULAR | Status: DC

## 2024-09-27 MED ORDER — ENOXAPARIN SODIUM 40 MG/0.4ML IJ SOSY
40.0000 mg | PREFILLED_SYRINGE | INTRAMUSCULAR | Status: DC
  Administered 2024-09-28 – 2024-09-29 (×2): 40 mg via SUBCUTANEOUS
  Filled 2024-09-27 (×2): qty 0.4

## 2024-09-27 MED ORDER — FAMOTIDINE 20 MG PO TABS
20.0000 mg | ORAL_TABLET | Freq: Every day | ORAL | Status: DC
  Administered 2024-09-27 – 2024-09-29 (×3): 20 mg via ORAL
  Filled 2024-09-27 (×3): qty 1

## 2024-09-27 MED ORDER — ACETAMINOPHEN 325 MG PO TABS
650.0000 mg | ORAL_TABLET | Freq: Four times a day (QID) | ORAL | Status: DC | PRN
  Administered 2024-09-27 – 2024-09-29 (×3): 650 mg via ORAL
  Filled 2024-09-27 (×3): qty 2

## 2024-09-27 MED ORDER — VITAMIN D 25 MCG (1000 UNIT) PO TABS
1000.0000 [IU] | ORAL_TABLET | Freq: Every day | ORAL | Status: DC
  Administered 2024-09-27 – 2024-09-29 (×3): 1000 [IU] via ORAL
  Filled 2024-09-27 (×3): qty 1

## 2024-09-27 MED ORDER — TRAZODONE HCL 100 MG PO TABS
100.0000 mg | ORAL_TABLET | Freq: Every day | ORAL | Status: DC
  Administered 2024-09-27 – 2024-09-28 (×2): 100 mg via ORAL
  Filled 2024-09-27 (×2): qty 1

## 2024-09-27 MED ORDER — OXYCODONE HCL ER 10 MG PO T12A
10.0000 mg | EXTENDED_RELEASE_TABLET | Freq: Two times a day (BID) | ORAL | Status: DC
  Administered 2024-09-27 – 2024-09-28 (×3): 10 mg via ORAL
  Filled 2024-09-27 (×3): qty 1

## 2024-09-27 MED ORDER — ALUM & MAG HYDROXIDE-SIMETH 200-200-20 MG/5ML PO SUSP
30.0000 mL | ORAL | Status: DC | PRN
  Administered 2024-09-27: 30 mL via ORAL
  Filled 2024-09-27: qty 30

## 2024-09-27 NOTE — Progress Notes (Signed)
 " PROGRESS NOTE    Alexander Roberts  FMW:[id number] DOB: 1985-09-02 DOA: 09/24/2024 PCP: Bon[place], [place]  E, PA-C   Brief Narrative:  39 year old male with no significant past medical history With exception of chronic back pain presenting with worsening back pain over the past few days with worsening weakness in the left leg.   MRI of the spine showed discitis osteomyelitis at L4-L5, epidural fluid collection consistent with epidural abscess on the bottom of L2-L3 down to under the L5 lamina on the left at L4-L5 on the right, significant spinal stenosis L3-L4 and L4-L5  Neurosurgery consulted.  Status post  Decompressive lumbar laminectomy, medial facetectomy foraminotomies L3-4 and L4-5 with evacuation of epidural abscess done on 09/24/2024  On broad-spectrum intravenous antibiotics, awaiting culture results, de-escalate antibiotics once appropriate.   He has been screened for CIR candidacy and is a potential candidate.   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  He is a potential candidate for CIR - Gram stain with Strep intermedius.  Infectious disease on board.  Patient is currently on intravenous Ceftriaxone . Dced IV Daptomycin . - Continue with 0.5 to 1 mg of Dilaudid  every 2 hours for severe pain -Added 10 mg long acting oxycodone  Q12H on 1/28.  Strep intermedius  bacteremia,POA: On IV ceftriaxone . Dced daptomycin  on 1/28.  Tobacco use Counseled regarding smoking cessation -continue with nicotine  patch   Multilevel spinal stenosis Acute on chronic back pain with a chronic history of multilevel spinal stenosis -with some inflammation - With new onset weakness and paresthesia -Neurosurgery on board, appreciate assistance.  Status post surgery. - Continue with intravenous Dilaudid  for severe pain  Vitamin D  deficiency: Started on oral cholecalciferol  1000 units daily   Disposition: He is a potential candidate for CIR.  DVT prophylaxis: SCD's Start: 09/24/24 1820 Place and maintain sequential compression device Start: 09/24/24 0835 Place TED hose Start: 09/24/24 0835     Code Status: Full Code Family Communication:  Wife at the bedside Status is: Inpatient Remains inpatient appropriate because: Decompressive lumbar laminectomy, medial facetectomy foraminotomies L3-4 and L4-5 with evacuation of epidural abscess     Subjective:  No acute events overnight. Numbness and pain are better. Left leg numbness is more pronounced that the right leg. He did walk to restroom today. We spoke about antibiotics adjustments.  Examination:  General exam: Appears calm and comfortable  Respiratory system: Clear to auscultation. Respiratory effort normal. Cardiovascular system: S1 & S2 heard, RRR. No JVD, murmurs, rubs, gallops or clicks. No pedal edema. Gastrointestinal system: Abdomen is nondistended, soft and nontender. No organomegaly or masses felt. Normal bowel sounds heard. Central nervous system: Alert and oriented.  Extremities: Slightly decreased power in left leg as compared to the right leg Skin: No rashes, lesions or ulcers Psychiatry: Judgement and insight appear normal. Mood & affect appropriate.      Diet Orders (From admission, onward)     Start  Ordered   09/24/24 1851  Diet regular Room service appropriate? Yes; Fluid  consistency: Thin  Diet effective now       Question Answer Comment  Room service appropriate? Yes   Fluid consistency: Thin      09/24/24 1850            Objective: Vitals:   09/26/24 2200 09/27/24 0500 09/27/24 0624 09/27/24 0801  BP: 137/80  121/65 (!) 149/94  Pulse: 77  73 68  Resp: 18  16 16   Temp: 98.4 F (36.9 C)  98.1 F (36.7 C) 98.4 F (36.9 C)  TempSrc: Oral  Oral Oral  SpO2: 99%  97% 100%  Weight:  (!) 146.2 kg    Height:        Intake/Output Summary (Last 24 hours) at 09/27/2024 0827 Last data filed at 09/27/2024 0500 Gross per 24 hour  Intake 960 ml  Output 925 ml  Net 35 ml   Filed Weights   09/26/24 0500 09/26/24 1713 09/27/24 0500  Weight: (!) 145.3 kg (!) 145.3 kg (!) 146.2 kg    Scheduled Meds:  celecoxib   200 mg Oral Q12H   DULoxetine   30 mg Oral Daily   Followed by   NOREEN ON 10/01/2024] DULoxetine   60 mg Oral Daily   nicotine   14 mg Transdermal Daily   senna-docusate  1 tablet Oral BID   sodium chloride  flush  3 mL Intravenous Q12H   sodium chloride  flush  3 mL Intravenous Q12H   sodium chloride  flush  3 mL Intravenous Q12H   Continuous Infusions:  cefTRIAXone  (ROCEPHIN )  IV 2 g (09/26/24 1557)   DAPTOmycin  900 mg (09/26/24 1415)    Nutritional status     Body mass index is 40.29 kg/m.  Data Reviewed:   CBC: Recent Labs  Lab 09/24/24 0156 09/25/24 0413 09/26/24 0555  WBC 13.5* 15.5* 10.7*  NEUTROABS 10.4* 13.5* 7.4  HGB 12.7* 11.5* 11.6*  HCT 36.9* 32.6* 33.8*  MCV 80.7 79.1* 81.6  PLT 330 347 403*   Basic Metabolic Panel: Recent Labs  Lab 09/24/24 0156 09/24/24 0751 09/25/24 0413 09/26/24 0555  NA 135  --  131* 132*  K 4.3  --  4.3 4.1  CL 96*  --  93* 96*  CO2 27  --  26 24  GLUCOSE 110*  --  117* 89  BUN 11  --  13 13  CREATININE 0.78 0.72 0.76 0.70  CALCIUM  10.2  --  9.6 9.3  MG  --  1.7  --   --   PHOS  --  4.3  --   --    GFR: Estimated Creatinine Clearance: 193.4 mL/min (by C-G formula based on  SCr of 0.7 mg/dL). Liver Function Tests: Recent Labs  Lab 09/24/24 0156  AST 17  ALT 30  ALKPHOS 106  BILITOT 0.6  PROT 7.3  ALBUMIN 3.7   No results for input(s): LIPASE, AMYLASE in the last 168 hours. No results for input(s): AMMONIA in the last 168 hours. Coagulation Profile: Recent Labs  Lab 09/24/24 0156 09/25/24 0413  INR 1.0 1.1   Cardiac Enzymes: Recent Labs  Lab 09/26/24 0555  CKTOTAL 92   BNP (last 3 results) No results for input(s): PROBNP in the last 8760 hours. HbA1C: No results for input(s): HGBA1C in the last 72 hours. CBG: Recent Labs  Lab 09/25/24 0740 09/25/24 1218 09/25/24 1627 09/26/24 0755 09/27/24 0750  GLUCAP 101* 132* 101* 103* 110*   Lipid Profile: No results  for input(s): CHOL, HDL, LDLCALC, TRIG, CHOLHDL, LDLDIRECT in the last 72 hours. Thyroid Function Tests: Recent Labs    09/26/24 0555  TSH 1.120   Anemia Panel: No results for input(s): VITAMINB12, FOLATE, FERRITIN, TIBC, IRON, RETICCTPCT in the last 72 hours. Sepsis Labs: Recent Labs  Lab 09/24/24 0156 09/24/24 0751  PROCALCITON  --  0.24  LATICACIDVEN 0.9  --     Recent Results (from the past 240 hours)  Blood Culture (routine x 2)     Status: Abnormal (Preliminary result)   Collection Time: 09/24/24  1:54 AM   Specimen: BLOOD  Result Value Ref Range Status   Specimen Description   Final    BLOOD RIGHT ANTECUBITAL Performed at [facility], [address] [place], [place] [address]    Special Requests   Final    BOTTLES DRAWN AEROBIC AND ANAEROBIC Blood Culture adequate volume Performed at [facility], [address] [place], [place] [address]    Culture  Setup Time   Final    [facility] POSITIVE COCCI ANAEROBIC BOTTLE ONLY PHARMD CHARLENA SLADE [phone] @ 1948 FH    Culture (A)  Final    STREPTOCOCCUS INTERMEDIUS SUSCEPTIBILITIES TO FOLLOW Performed at [facility], [address], [place],  [place] [address]    Report Status PENDING  Incomplete  Blood Culture (routine x 2)     Status: Abnormal (Preliminary result)   Collection Time: 09/24/24  1:54 AM   Specimen: BLOOD  Result Value Ref Range Status   Specimen Description   Final    BLOOD LEFT ANTECUBITAL Performed at [facility], [address] [place], [place] [address]    Special Requests   Final    BOTTLES DRAWN AEROBIC AND ANAEROBIC Blood Culture adequate volume Performed at [facility], [address] [place], [place] [address]    Culture  Setup Time   Final    GRAM POSITIVE COCCI ANAEROBIC BOTTLE ONLY CRITICAL VALUE NOTED.  VALUE IS CONSISTENT WITH PREVIOUSLY REPORTED AND CALLED VALUE. Performed at [facility], [address] [place], [place] [address]    Culture STREPTOCOCCUS INTERMEDIUS (A)  Final   Report Status PENDING  Incomplete  Blood Culture ID Panel (Reflexed)     Status: Abnormal   Collection Time: 09/24/24  1:54 AM  Result Value Ref Range Status   Enterococcus faecalis NOT DETECTED NOT DETECTED Final   Enterococcus Faecium NOT DETECTED NOT DETECTED Final   Listeria monocytogenes NOT DETECTED NOT DETECTED Final   Staphylococcus species NOT DETECTED NOT DETECTED Final   Staphylococcus aureus (BCID) NOT DETECTED NOT DETECTED Final   Staphylococcus epidermidis NOT DETECTED NOT DETECTED Final   Staphylococcus lugdunensis NOT DETECTED NOT DETECTED Final   Streptococcus species DETECTED (A) NOT DETECTED Final    Comment: Not Enterococcus species, Streptococcus agalactiae, Streptococcus pyogenes, or Streptococcus pneumoniae. CRITICAL RESULT CALLED TO, READ BACK BY AND VERIFIED WITH: PHARMD ESABRA SLADE [id number] @ 1948 FH    Streptococcus agalactiae NOT DETECTED NOT DETECTED Final   Streptococcus pneumoniae NOT DETECTED NOT DETECTED Final   Streptococcus pyogenes NOT DETECTED NOT DETECTED Final   A.calcoaceticus-baumannii NOT DETECTED NOT DETECTED Final   Bacteroides fragilis NOT  DETECTED NOT DETECTED Final   Enterobacterales NOT DETECTED NOT DETECTED Final   Enterobacter cloacae complex NOT DETECTED NOT DETECTED Final   Escherichia coli NOT DETECTED NOT DETECTED Final   Klebsiella aerogenes NOT DETECTED NOT DETECTED Final  Klebsiella oxytoca NOT DETECTED NOT DETECTED Final   Klebsiella pneumoniae NOT DETECTED NOT DETECTED Final   Proteus species NOT DETECTED NOT DETECTED Final   Salmonella species NOT DETECTED NOT DETECTED Final   Serratia marcescens NOT DETECTED NOT DETECTED Final   Haemophilus influenzae NOT DETECTED NOT DETECTED Final   Neisseria meningitidis NOT DETECTED NOT DETECTED Final   Pseudomonas aeruginosa NOT DETECTED NOT DETECTED Final   Stenotrophomonas maltophilia NOT DETECTED NOT DETECTED Final   Candida albicans NOT DETECTED NOT DETECTED Final   Candida auris NOT DETECTED NOT DETECTED Final   Candida glabrata NOT DETECTED NOT DETECTED Final   Candida krusei NOT DETECTED NOT DETECTED Final   Candida parapsilosis NOT DETECTED NOT DETECTED Final   Candida tropicalis NOT DETECTED NOT DETECTED Final   Cryptococcus neoformans/gattii NOT DETECTED NOT DETECTED Final    Comment: Performed at [facility], [address], [place], [place] [address]  Surgical pcr screen     Status: None   Collection Time: 09/24/24 12:32 PM   Specimen: Nasal Mucosa; Nasal Swab  Result Value Ref Range Status   MRSA, PCR NEGATIVE NEGATIVE Final   Staphylococcus aureus NEGATIVE NEGATIVE Final    Comment: (NOTE) The Xpert SA Assay (FDA approved for NASAL specimens in patients 1 years of age and older), is one component of a comprehensive surveillance program. It is not intended to diagnose infection nor to guide or monitor treatment. Performed at [facility], [address] [place], [place] [address]   Aerobic/Anaerobic Culture w Gram Stain (surgical/deep wound)     Status: None (Preliminary result)   Collection Time: 09/24/24  2:14 PM   Specimen:  Abscess  Result Value Ref Range Status   Specimen Description ABSCESS  Final   Special Requests LUMBAR FOUR FIVE EPIDURAL ABSCESS  Final   Gram Stain   Final    RARE WBC PRESENT, PREDOMINANTLY PMN RARE GRAM POSITIVE COCCI    Culture   Final    FEW STREPTOCOCCUS INTERMEDIUS SUSCEPTIBILITIES TO FOLLOW Performed at [facility], [address] [place], [place] [address]    Report Status PENDING  Incomplete  Aerobic/Anaerobic Culture w Gram Stain (surgical/deep wound)     Status: None (Preliminary result)   Collection Time: 09/24/24  2:21 PM   Specimen: Abscess  Result Value Ref Range Status   Specimen Description ABSCESS  Final   Special Requests LUMBAR FOUR FIVE EPIDURAL ABSCESS  Final   Gram Stain NO WBC SEEN NO ORGANISMS SEEN   Final   Culture   Final    CULTURE REINCUBATED FOR BETTER GROWTH Performed at [facility], [address], [place], [place] [address]    Report Status PENDING  Incomplete  Culture, blood (Routine X 2) w Reflex to ID Panel     Status: None (Preliminary result)   Collection Time: 09/26/24  9:49 AM   Specimen: BLOOD LEFT HAND  Result Value Ref Range Status   Specimen Description BLOOD LEFT HAND  Final   Special Requests   Final    BOTTLES DRAWN AEROBIC ONLY Blood Culture adequate volume Performed at [facility], [address] [place], [place] [address]    Culture PENDING  Incomplete   Report Status PENDING  Incomplete  Culture, blood (Routine X 2) w Reflex to ID Panel     Status: None (Preliminary result)   Collection Time: 09/26/24  9:49 AM   Specimen: BLOOD RIGHT ARM  Result Value Ref Range  Status   Specimen Description BLOOD RIGHT ARM  Final   Special Requests   Final    BOTTLES DRAWN AEROBIC ONLY Blood Culture adequate volume Performed at [facility], [address] [place], [place] [address]    Culture PENDING  Incomplete   Report Status PENDING  Incomplete         Radiology Studies: No results  found.          LOS: 3 days   Time spent= 35 mins    Deliliah Room, MD [facility]  If 7PM-7AM, please contact night-coverage  09/27/2024, 8:27 AM  "

## 2024-09-27 NOTE — Progress Notes (Signed)
 Occupational Therapy Treatment Patient Details Name: Alexander Roberts MRN: [id number] DOB: 1986/02/04 Today's Date: 09/27/2024   History of present illness Pt is a 39 y.o. 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 S/p decompressive lumbar laminectomy, medial facetectomy foraminotomies L3-4 and L4-5 with evacuation of epidural abscess 1/25. Significant PMH: none.   OT comments  Pt progressing well towards OT goals. Focus of session on educating Pt and spouse on AE for LB dressing and bathing. Pt able to return demonstration of sock aide and reacher with up to Min A. Pt required Min A for functional transfer at end of session. OT to continue to follow Pt acutely, continue per POC.       If plan is discharge home, recommend the following:  A lot of help with walking and/or transfers;A lot of help with bathing/dressing/bathroom;Assistance with cooking/housework;Assist for transportation   Equipment Recommendations  Other (comment) (defer)    Recommendations for Other Services      Precautions / Restrictions Precautions Precautions: Fall;Back Precaution Booklet Issued: No Recall of Precautions/Restrictions: Intact Precaution/Restrictions Comments: Hemovac Restrictions Weight Bearing Restrictions Per Provider Order: No       Mobility Bed Mobility               General bed mobility comments: Pt returned to sitting EOB from recliner. Pt requesting to sit EOB, wife stated she wanted to change his gown in a few minutes prior to returning to supine. Per chart review, Pt requires no physical assistance    Transfers Overall transfer level: Needs assistance Equipment used: Rolling walker (2 wheels) Transfers: Sit to/from Stand, Bed to chair/wheelchair/BSC Sit to Stand: Min assist     Step pivot transfers:  Contact guard assist     General transfer comment: Min A to stand from recliner. CGA for step pivot to return to bed.     Balance Overall balance assessment: Needs assistance Sitting-balance support: Feet supported, No upper extremity supported Sitting balance-Leahy Scale: Good     Standing balance support: Bilateral upper extremity supported, During functional activity, Reliant on assistive device for balance Standing balance-Leahy Scale: Poor Standing balance comment: Dependent on RW                           ADL either performed or assessed with clinical judgement   ADL Overall ADL's : Needs assistance/impaired             Lower Body Bathing: Contact guard assist;With adaptive equipment;Adhering to back precautions Lower Body Bathing Details (indicate cue type and reason): CGA for return demonstration of long handled sponge use Upper Body Dressing : Minimal assistance;Sitting Upper Body Dressing Details (indicate cue type and reason): Min A from spouse Lower Body Dressing: Minimal assistance;With adaptive equipment;Moderate assistance Lower Body Dressing Details (indicate cue type and reason): Min A using reacher and sock aide while returning demonstration for donning/doffing socks. Increased assist needed for pants                    Extremity/Trunk Assessment Upper Extremity Assessment Upper Extremity Assessment: Overall WFL for tasks assessed            Vision       Perception     Praxis     Communication Communication Communication: No apparent difficulties   Cognition  Arousal: Alert Behavior During Therapy: WFL for tasks assessed/performed Cognition: No apparent impairments                               Following commands: Intact        Cueing   Cueing Techniques: Verbal cues, Visual cues  Exercises      Shoulder Instructions       General Comments Educated Pt and wife on LB AE for dressing and bathing. Pt  able to return demonstration    Pertinent Vitals/ Pain       Pain Assessment Pain Assessment: Faces Faces Pain Scale: Hurts whole lot Pain Location: Back with mobility Pain Descriptors / Indicators: Discomfort, Grimacing Pain Intervention(s): Limited activity within patient's tolerance, Monitored during session  Home Living                                          Prior Functioning/Environment              Frequency  Min 2X/week        Progress Toward Goals  OT Goals(current goals can now be found in the care plan section)  Progress towards OT goals: Progressing toward goals  Acute Rehab OT Goals Patient Stated Goal: to decrease pain OT Goal Formulation: With patient Time For Goal Achievement: 10/09/24 Potential to Achieve Goals: Good ADL Goals Pt Will Perform Lower Body Bathing: with min assist;sit to/from stand;with adaptive equipment;sitting/lateral leans Pt Will Perform Lower Body Dressing: with min assist;sit to/from stand;sitting/lateral leans;with adaptive equipment Pt Will Transfer to Toilet: with min assist;ambulating  Plan      Co-evaluation                 AM-PAC OT 6 Clicks Daily Activity     Outcome Measure   Help from another person eating meals?: None Help from another person taking care of personal grooming?: A Little Help from another person toileting, which includes using toliet, bedpan, or urinal?: A Lot Help from another person bathing (including washing, rinsing, drying)?: A Lot Help from another person to put on and taking off regular upper body clothing?: A Little Help from another person to put on and taking off regular lower body clothing?: A Lot 6 Click Score: 16    End of Session Equipment Utilized During Treatment: Rolling walker (2 wheels)  OT Visit Diagnosis: Unsteadiness on feet (R26.81);Other abnormalities of gait and mobility (R26.89);Muscle weakness (generalized) (M62.81)   Activity Tolerance  Patient tolerated treatment well   Patient Left in bed;with call bell/phone within reach;with family/visitor present   Nurse Communication          Time: 8992-8981 OT Time Calculation (min): 11 min  Charges: OT General Charges $OT Visit: 1 Visit OT Treatments $Self Care/Home Management : 8-22 mins  Maurilio CROME, OTR/L.  [facility] Acute Rehabilitation  Office: [phone]   Maurilio PARAS Eliyana Pagliaro 09/27/2024, 11:12 AM

## 2024-09-27 NOTE — Progress Notes (Signed)
" ° °  Inpatient Rehabilitation Admissions Coordinator   I await insurance determination for possible CIR admit.  Heron Leavell, RN, MSN Rehab Admissions Coordinator [phone] 09/27/2024 10:50 AM  "

## 2024-09-27 NOTE — Progress Notes (Signed)
" °  °  Providing Compassionate, Quality Care - Together   NEUROSURGERY PROGRESS NOTE     S: No issues overnight.    O: EXAM:  BP (!) 149/94 (BP Location: Left Wrist)   Pulse 68   Temp 98.4 F (36.9 C) (Oral)   Resp 16   Ht 6' 3 (1.905 m)   Wt (!) 146.2 kg   SpO2 100%   BMI 40.29 kg/m     Awake, alert, oriented  Speech fluent, appropriate  BUE/BLE 5/5, 4+/5 DF LLE SILTx4, subjectively decreased LLE  Dressing c/d/I Hemovac in place   ASSESSMENT:  39 y.o. S/p lami for lumbar discitis/EDA. Outpatient blood cultures positive for GPCs.     PLAN: -Continue abx -Continue therapies as tolerated -Call w/ questions/concerns.   Camie Pickle, PAC  "

## 2024-09-27 NOTE — Progress Notes (Signed)
 Patient refused his lab draw for the early morning but said that he will allow later in the day.

## 2024-09-27 NOTE — PMR Pre-admission (Signed)
 PMR Admission Coordinator Pre-Admission Assessment  Patient: Alexander Roberts is an 39 y.o., male MRN: [id number] DOB: 05-27-1986 Height: 6' 3 (190.5 cm) Weight: (!) 147.5 kg  Insurance Information HMO:     PPO: yes     PCP:      IPA:      80/20:      OTHER:  PRIMARY: BCBS Comm PPO ( [place] )      Policy#: [id number]      Subscriber: pt CM Name: Renna      Phone#: [phone]     Fax#: [phone] Pre-Cert#: [id number] Auth for CIR from Corinne with [employer]BS [place]  for admit 09/27/24 until 10/04/24. NRD 10/03/24.  Updates due to Corinne at fax listed above.        Employer: [employer] Benefits:  Phone #: [phone]     Name: 1/27 Eff. Date: 08/31/22     Deduct: $3400      Out of Pocket Max: [phone]      Life Max: none CIR: 75%      SNF: 75% Outpatient: 75%     Co-Pay: 25% Home Health: 75%      Co-Pay: 25% DME: 75%     Co-Pay: 25% Providers: in network  SECONDARY: none      Policy#:      Phone#:   Artist:       Phone#:   The Best Boy for patients in Inpatient Rehabilitation Facilities with attached Privacy Act Statement-Health Care Records was provided and verbally reviewed with: Patient and Family  Emergency Contact Information Contact Information     Name Relation Home Work Mobile   Challis Spouse   [phone]      Other Contacts     Name Relation Home Work Mobile   Hirano,Floanche Aunt [phone]        Current Medical History  Patient Admitting Diagnosis: Osteomyelitis and discitis of lumbar spine  History of Present Illness: 39 yo male with no significant past medical history except chronic back pain with epidural injection. Presented on 09/24/24 with 2 week history of severe back pain. Has two ER visits and saw neurosurgeon as OP. OP MRI without contrast was suggestive of discitis and osteomyelitis.   On 1/24 was walking up stairs and his left leg went numb. Also developed numbness in his perineum and RLE.  Presented on 09/24/24 to [facility]. Transferred to [facility] hospital on 09/24/24.   MRI initially was done 09/20/2024 as an outpatient without contrast suggestive of discitis and osteomyelitis.  Admission chemistries unremarkable except blood culture Streptococcus intermedius, sedimentation rate 55, C-reactive protein 40.9, glucose 110, chloride 96, WBC 13,500.  Patient was initially placed on broad-spectrum antibiotics.    Neurosurgery Dr. Alm Molt as well as infectious disease Dr. Dennise consulted.  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  Findings were consistent with discitis and osteomyelitis L4-5 and L5-S1 with additional concern for discitis at L3-L4.  Possible osteomyelitis in the posterior L3 vertebral body.  Paraspinal and psoas muscle edema with multiple collections concerning for abscesses, including a partially visualized right paraspinal collection at L4-5 and left paraspinal/psoas collection at L5-S1.  Severe spinal canal stenosis at L2-3, L3-4 and L4-5 with severe right and moderate to severe left foraminal stenosis at L5-S1.  Patient underwent decompressive lumbar laminectomy, medial facetectomy foraminotomies L3-4  and L4-5 with evacuation of epidural abscess 09/24/2024 per Dr. Alm Molt.NO BRACE NEEDED.   Hospital course follow-up ID for Streptococcus intermedius bacteremia with latest blood cultures 1/27 showing no growth to date.  1/29 TTE with no vegetations.  CT abdomen pelvis with contrast to rule out any intra-abdominal abscess due to high association with pyogenic infection with Streptococcus intermedius showed vague fat stranding along a focal distal sigmoid diverticulum representing resolving or developing acute diverticulitis however doubtful and continue to monitor.  No focal soft tissue  abnormality.  Severe degenerative changes at the L4-L5 level.  No acute osseous abnormality.  Patient currently remains on IV ceftriaxone  awaiting full sensitivities follow-up per ID.  Patient was cleared to begin Lovenox  for DVT prophylaxis 09/28/2024.  Psychiatry services consulted 09/26/2024 for adjustment disorder patient with noted increased depression and tearfulness and was started on duloxetine  titrated as needed as well as trazodone  and psychiatry services signed off 09/28/2024   [facility] has been reviewed by the rehabilitation admission coordinator and physician.  Past Medical History  Past Medical History:  Diagnosis Date   Medical history non-contributory    Has the patient had major surgery during 100 days prior to admission? Yes  Family History   family history is not on file.  Current Medications Current Medications[1]  Patients Current Diet:  Diet Order             Diet regular Room service appropriate? Yes; Fluid consistency: Thin  Diet effective now                  Precautions / Restrictions Precautions Precautions: Fall, Back Precaution Booklet Issued: No Precaution/Restrictions Comments: Hemovac Restrictions Weight Bearing Restrictions Per Provider Order: No   Has the patient had 2 or more falls or a fall with injury in the past year? No  Prior Activity Level Community (5-7x/wk): Independent, working and driving  Prior Functional Level Self Care: Did the patient need help bathing, dressing, using the toilet or eating? Independent  Indoor Mobility: Did the patient need assistance with walking from room to room (with or without device)? Independent  Stairs: Did the patient need assistance with internal or external stairs (with or without device)? Independent  Functional Cognition: Did the patient need help planning regular tasks such as shopping or remembering to take medications?  Independent  Patient Information Are you of Hispanic, Latino/a,or Spanish origin?: A. No, not of Hispanic, Latino/a, or Spanish origin What is your race?: B. Black or African American Do you need or want an interpreter to communicate with a doctor or health care staff?: 0. No  Patient's Response To:  Health Literacy and Transportation Is the patient able to respond to health literacy and transportation needs?: Yes Health Literacy - How often do you need to have someone help you when you read instructions, pamphlets, or other written material from your doctor or pharmacy?: Never In the past 12 months, has lack of transportation kept you from medical appointments or from getting medications?: No In the past 12 months, has lack of transportation kept you from meetings, work, or from getting things needed for daily living?: No  Home Assistive Devices / Equipment Home Equipment: Agricultural Consultant (2 wheels)  Prior Device Use: Indicate devices/aids used by the patient prior to current illness, exacerbation or injury? None of the above  Current Functional Level Cognition  Orientation Level: Oriented X4    Extremity Assessment (includes Sensation/Coordination)  Upper  Extremity Assessment: Overall WFL for tasks assessed  Lower Extremity Assessment: Defer to PT evaluation RLE Deficits / Details: Reports numbness in buttocks and foot. Strength 5/5 LLE Deficits / Details: Decreased light touch sensation distal to knee. R hip flexion 1/5, knee extension 2/5, ankle dorsiflexion 5/5    ADLs  Overall ADL's : Needs assistance/impaired Eating/Feeding: Independent Grooming: Set up, Sitting Upper Body Bathing: Minimal assistance, Sitting Lower Body Bathing: Contact guard assist, With adaptive equipment, Adhering to back precautions Lower Body Bathing Details (indicate cue type and reason): CGA for return demonstration of long handled sponge use Upper Body Dressing : Minimal assistance, Sitting Upper  Body Dressing Details (indicate cue type and reason): Min A from spouse Lower Body Dressing: Minimal assistance, With adaptive equipment, Moderate assistance Lower Body Dressing Details (indicate cue type and reason): Min A using reacher and sock aide while returning demonstration for donning/doffing socks. Increased assist needed for pants Toilet Transfer: Moderate assistance, Stand-pivot, BSC/3in1, Rolling walker (2 wheels) Toileting- Clothing Manipulation and Hygiene: Maximal assistance, Sitting/lateral lean, Sit to/from stand    Mobility  Overal bed mobility: Needs Assistance Bed Mobility: Rolling, Sidelying to Sit, Sit to Sidelying Rolling: Supervision Sidelying to sit: Supervision Sit to sidelying: Min assist General bed mobility comments: HOB flat, good log roll technique, increased time to transition LLE off edge of bed, heavy use of UE to sit upright but no physical assist. MinA for LLE negotiation back into bed    Transfers  Overall transfer level: Needs assistance Equipment used: Rolling walker (2 wheels) Transfers: Sit to/from Stand Sit to Stand: Contact guard assist Bed to/from chair/wheelchair/BSC transfer type:: Step pivot Step pivot transfers: Contact guard assist General transfer comment: Use of momentum to power up    Ambulation / Gait / Stairs / Wheelchair Mobility  Ambulation/Gait Ambulation/Gait assistance: Contact guard assist Gait Distance (Feet): 75 Feet Assistive device: Rolling walker (2 wheels) Gait Pattern/deviations: Step-to pattern, Decreased dorsiflexion - left, Decreased stance time - left General Gait Details: Pt self cueing for smaller steps for increased control, tactile cues provided to decrease L knee hyperextension. Heavy use of UE's on RW. Gait velocity: decreased Gait velocity interpretation: <1.8 ft/sec, indicate of risk for recurrent falls    Posture / Balance Dynamic Sitting Balance Sitting balance - Comments: Reliant on BUE support due to  pain Balance Overall balance assessment: Needs assistance Sitting-balance support: Feet supported Sitting balance-Leahy Scale: Good Sitting balance - Comments: Reliant on BUE support due to pain Standing balance support: Bilateral upper extremity supported Standing balance-Leahy Scale: Poor Standing balance comment: Dependent on RW    Special considerations/life events  Prolonged IV antibiotics via PICC  at discharge   Psychiatry consulted for depression and tearful Previous Home Environment Living Arrangements: Spouse/significant other, Children  Lives With: Spouse Available Help at Discharge: Family, Available 24 hours/day (wife will take FMLA) Type of Home: House Home Layout: One level Home Access: Stairs to enter Entrance Stairs-Rails: Left Entrance Stairs-Number of Steps: 4 Bathroom Shower/Tub: Engineer, Manufacturing Systems: Standard Bathroom Accessibility: Yes How Accessible: Accessible via walker Home Care Services: No  Discharge Living Setting Plans for Discharge Living Setting: Patient's home, House, Lives with (comment) (wife) Type of Home at Discharge: House Discharge Home Layout: One level Discharge Home Access: Stairs to enter Entrance Stairs-Rails: Left Entrance Stairs-Number of Steps: 4 Discharge Bathroom Shower/Tub: Tub/shower unit Discharge Bathroom Toilet: Standard Discharge Bathroom Accessibility: Yes How Accessible: Accessible via walker Does the patient have any problems obtaining your medications?: No  Social/Family/Support Systems  Patient Roles: Spouse (employee) Contact Information: wife Anticipated Caregiver: wife Anticipated Industrial/product Designer Information: see contacts Ability/Limitations of Caregiver: no limitations Caregiver Availability: 24/7 Discharge Plan Discussed with Primary Caregiver: Yes Is Caregiver In Agreement with Plan?: Yes Does Caregiver/Family have Issues with Lodging/Transportation while Pt is in Rehab?:  No  Goals Patient/Family Goal for Rehab: Mod I to supervision with PT and OT Expected length of stay: ELOS 7 to 10 days Pt/Family Agrees to Admission and willing to participate: Yes Program Orientation Provided & Reviewed with Pt/Caregiver Including Roles  & Responsibilities: Yes  Decrease burden of Care through IP rehab admission: n/a  Possible need for SNF placement upon discharge: not anticipated  Patient Condition: I have reviewed medical records from [facility] and [facility], spoken with patient and spouse. I met with patient at the bedside for inpatient rehabilitation assessment.  Patient will benefit from ongoing PT and OT, can actively participate in 3 hours of therapy a day 5 days of the week, and can make measurable gains during the admission.  Patient will also benefit from the coordinated team approach during an Inpatient Acute Rehabilitation admission.  The patient will receive intensive therapy as well as Rehabilitation physician, nursing, social worker, and care management interventions.  Due to bladder management, bowel management, safety, skin/wound care, disease management, medication administration, pain management, and patient education the patient requires 24 hour a day rehabilitation nursing.  The patient is currently min to CGA with mobility and basic ADLs.  Discharge setting and therapy post discharge at home with home health is anticipated.  Patient has agreed to participate in the Acute Inpatient Rehabilitation Program and will admit today.  Preadmission Screen Completed By:  Alison Heron Lot, RN MSN 09/29/2024 11:11 AM ______________________________________________________________________   Discussed status with Dr. Babs on 09/29/24 at 1112 and received approval for admission today.  Admission Coordinator:  Alison Heron Lot, RN MSN time [id number] Date 09/29/24   Assessment/Plan: Diagnosis: discitis/thoracic abscess Does the need for close,  24 hr/day Medical supervision in concert with the patient's rehab needs make it unreasonable for this patient to be served in a less intensive setting? Yes Co-Morbidities requiring supervision/potential complications: pain, bowels and bladder, htn Due to bladder management, bowel management, safety, skin/wound care, disease management, medication administration, pain management, and patient education, does the patient require 24 hr/day rehab nursing? Yes Does the patient require coordinated care of a physician, rehab nurse, PT, OT, and SLP to address physical and functional deficits in the context of the above medical diagnosis(es)? Yes Addressing deficits in the following areas: balance, endurance, locomotion, strength, transferring, bowel/bladder control, bathing, dressing, feeding, grooming, toileting, and psychosocial support Can the patient actively participate in an intensive therapy program of at least 3 hrs of therapy 5 days a week? Yes The potential for patient to make measurable gains while on inpatient rehab is excellent Anticipated functional outcomes upon discharge from inpatient rehab: modified independent and supervision PT, modified independent and supervision OT, n/a SLP Estimated rehab length of stay to reach the above functional goals is: 7-10 days Anticipated discharge destination: Home 10. Overall Rehab/Functional Prognosis: excellent   MD Signature: Arthea IVAR Babs, MD, [facility] Physical Medicine & Rehabilitation Medical Director Rehabilitation Services 09/29/2024      [1]  Current Facility-Administered Medications:    acetaminophen  (TYLENOL ) tablet 650 mg, 650 mg, Oral, Q6H PRN, Rashid, Farhan, MD, 650 mg at 09/28/24 1553   alum & mag hydroxide-simeth (MAALOX/MYLANTA) 200-200-20 MG/5ML suspension 30 mL, 30  mL, Oral, Q4H PRN, Dino Antu, MD, 30 mL at 09/27/24 1234   bisacodyl  (DULCOLAX) EC tablet 5 mg, 5 mg, Oral, Daily PRN, Muzamil Alm Hamilton, MD, 5 mg  at 09/25/24 2052   cefTRIAXone  (ROCEPHIN ) 2 g in sodium chloride  0.9 % 100 mL IVPB, 2 g, Intravenous, Q24H, Manandhar, Sabina, MD, Last Rate: 200 mL/hr at 09/28/24 1522, 2 g at 09/28/24 1522   celecoxib  (CELEBREX ) capsule 200 mg, 200 mg, Oral, Q12H, Adalid Alm Hamilton, MD, 200 mg at 09/29/24 9161   cholecalciferol  (VITAMIN D3) 25 MCG (1000 UNIT) tablet 1,000 Units, 1,000 Units, Oral, Daily, Dino Antu, MD, 1,000 Units at 09/29/24 9161   DULoxetine  (CYMBALTA ) DR capsule 30 mg, 30 mg, Oral, Daily, 30 mg at 09/29/24 0838 **FOLLOWED BY** [START ON 10/01/2024] DULoxetine  (CYMBALTA ) DR capsule 60 mg, 60 mg, Oral, Daily, Wise, Lynwood Morene Deems, MD   enoxaparin  (LOVENOX ) injection 40 mg, 40 mg, Subcutaneous, Q24H, Johnanna Credit Caylin, PA-C, 40 mg at 09/29/24 0840   famotidine  (PEPCID ) tablet 20 mg, 20 mg, Oral, Daily, Rashid, Farhan, MD, 20 mg at 09/29/24 9161   hydrALAZINE  (APRESOLINE ) injection 10 mg, 10 mg, Intravenous, Q4H PRN, Adolf Alm Hamilton, MD   HYDROmorphone  (DILAUDID ) injection 0.5-1 mg, 0.5-1 mg, Intravenous, Q2H PRN, Reinhart Alm Hamilton, MD, 1 mg at 09/29/24 0746   ipratropium (ATROVENT ) nebulizer solution 0.5 mg, 0.5 mg, Nebulization, Q6H PRN, Julen Alm Hamilton, MD   menthol  (CEPACOL) lozenge 3 mg, 1 lozenge, Oral, PRN **OR** phenol (CHLORASEPTIC) mouth spray 1 spray, 1 spray, Mouth/Throat, PRN, Liberty Alm Hamilton, MD   methocarbamol  (ROBAXIN ) tablet 750 mg, 750 mg, Oral, QID, Rashid, Farhan, MD, 750 mg at 09/29/24 1021   nicotine  (NICODERM CQ  - dosed in mg/24 hours) patch 14 mg, 14 mg, Transdermal, Daily, Brittin Alm Hamilton, MD, 14 mg at 09/29/24 9160   ondansetron  (ZOFRAN ) tablet 4 mg, 4 mg, Oral, Q6H PRN, 4 mg at 09/27/24 1454 **OR** ondansetron  (ZOFRAN ) injection 4 mg, 4 mg, Intravenous, Q6H PRN, Richy Alm Hamilton, MD, 4 mg at 09/26/24 2046   Oral care mouth rinse, 15 mL, Mouth Rinse, PRN, Dino Antu, MD   oxyCODONE  (Oxy IR/ROXICODONE ) immediate release tablet 5 mg, 5  mg, Oral, Q3H PRN, Dimitri Alm Hamilton, MD, 5 mg at 09/29/24 9486   oxyCODONE  (OXYCONTIN ) 12 hr tablet 15 mg, 15 mg, Oral, Q12H, Rashid, Farhan, MD, 15 mg at 09/29/24 9161   senna-docusate (Senokot-S) tablet 1 tablet, 1 tablet, Oral, BID, Derel Alm Hamilton, MD, 1 tablet at 09/29/24 9161   sodium chloride  flush (NS) 0.9 % injection 3 mL, 3 mL, Intravenous, Q12H, Bertrum Alm Hamilton, MD, 3 mL at 09/29/24 0841   sodium chloride  flush (NS) 0.9 % injection 3 mL, 3 mL, Intravenous, PRN, Elim Alm Hamilton, MD   traZODone  (DESYREL ) tablet 100 mg, 100 mg, Oral, QHS, Delsie Lynwood Morene Deems, MD, 100 mg at 09/28/24 2108

## 2024-09-27 NOTE — Progress Notes (Signed)
 Physical Therapy Treatment Patient Details Name: Alexander Roberts MRN: [id number] DOB: 1986-06-17 Today's Date: 09/27/2024   History of Present Illness Pt is a 39 y.o. 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 S/p decompressive lumbar laminectomy, medial facetectomy foraminotomies L3-4 and L4-5 with evacuation of epidural abscess 1/25. Significant PMH: none.    PT Comments  Pt continues to make excellent progress towards his physical therapy goals and is highly motivated to participate. Session today focused on weight shifting for increased proprioceptive input and progressive ambulation. Pt requiring min assist for functional mobility, ambulating 40 ft with a RW with a step to gait pattern. Patient will benefit from intensive inpatient follow-up therapy, >3 hours/day.    If plan is discharge home, recommend the following: A lot of help with bathing/dressing/bathroom;A little help with walking and/or transfers   Can travel by private vehicle        Equipment Recommendations  BSC/3in1    Recommendations for Other Services Rehab consult     Precautions / Restrictions Precautions Precautions: Fall;Back Precaution Booklet Issued: No Recall of Precautions/Restrictions: Intact Precaution/Restrictions Comments: Hemovac Restrictions Weight Bearing Restrictions Per Provider Order: No     Mobility  Bed Mobility Overal bed mobility: Needs Assistance Bed Mobility: Rolling, Sidelying to Sit Rolling: Supervision Sidelying to sit: Supervision       General bed mobility comments: HOB flat, good log roll technique, increased time to transition LLE off edge of bed, heavy use of UE to sit upright but no physical assist today    Transfers Overall transfer level: Needs assistance Equipment used: Rolling walker (2  wheels) Transfers: Sit to/from Stand Sit to Stand: Min assist           General transfer comment: Verbal cues for hand placement    Ambulation/Gait Ambulation/Gait assistance: Min assist Gait Distance (Feet): 40 Feet Assistive device: Rolling walker (2 wheels) Gait Pattern/deviations: Step-to pattern, Decreased dorsiflexion - left, Decreased stance time - left Gait velocity: decreased Gait velocity interpretation: <1.8 ft/sec, indicate of risk for recurrent falls   General Gait Details: Verbal cues for neutral LLE positioning, smaller step for increased control. Step to gait pattern   Stairs             Wheelchair Mobility     Tilt Bed    Modified Rankin (Stroke Patients Only)       Balance Overall balance assessment: Needs assistance Sitting-balance support: Feet supported Sitting balance-Leahy Scale: Good     Standing balance support: Bilateral upper extremity supported Standing balance-Leahy Scale: Poor                              Communication Communication Communication: No apparent difficulties  Cognition Arousal: Alert Behavior During Therapy: WFL for tasks assessed/performed   PT - Cognitive impairments: No apparent impairments                         Following commands: Intact      Cueing Cueing Techniques: Verbal cues  Exercises General Exercises - Lower Extremity Long Arc Quad: AAROM, Left, 5 reps, Seated Other Exercises Other Exercises: Standing: weight shifting R/L x 1 minute    General Comments        Pertinent Vitals/Pain Pain Assessment Pain Assessment: Faces Faces Pain  Scale: Hurts even more Pain Location: back, radicular pain to RLE Pain Descriptors / Indicators: Operative site guarding, Grimacing, Moaning Pain Intervention(s): Monitored during session, Premedicated before session    Home Living                          Prior Function            PT Goals (current goals can now  be found in the care plan section) Acute Rehab PT Goals Patient Stated Goal: to return to work, fishing PT Goal Formulation: With patient Time For Goal Achievement: 10/09/24 Potential to Achieve Goals: Good Progress towards PT goals: Progressing toward goals    Frequency    Min 5X/week      PT Plan      Co-evaluation              AM-PAC PT 6 Clicks Mobility   Outcome Measure  Help needed turning from your back to your side while in a flat bed without using bedrails?: A Little Help needed moving from lying on your back to sitting on the side of a flat bed without using bedrails?: A Little Help needed moving to and from a bed to a chair (including a wheelchair)?: A Little Help needed standing up from a chair using your arms (e.g., wheelchair or bedside chair)?: A Little Help needed to walk in hospital room?: A Little Help needed climbing 3-5 steps with a railing? : Total 6 Click Score: 16    End of Session Equipment Utilized During Treatment: Gait belt Activity Tolerance: Patient tolerated treatment well Patient left: in chair;with call bell/phone within reach;with family/visitor present Nurse Communication: Mobility status PT Visit Diagnosis: Unsteadiness on feet (R26.81);Difficulty in walking, not elsewhere classified (R26.2);Pain Pain - part of body:  (back)     Time: 9073-9054 PT Time Calculation (min) (ACUTE ONLY): 19 min  Charges:    $Therapeutic Activity: 8-22 mins PT General Charges $$ ACUTE PT VISIT: 1 Visit                     [name], PT, DPT Acute Rehabilitation Services Office [phone]    [name] 09/27/2024, 10:26 AM

## 2024-09-27 NOTE — Progress Notes (Signed)
 Patient ID: Alexander Roberts, male   DOB: 13-Jun-1986, 39 y.o.   MRN: [id number] I stop by and talk to them this morning, he states he is feeling better.  He got up and walked to the bathroom and had a bowel movement last evening.  Still has some numbness in the left leg but feels the strength is improving and his pain is improving.

## 2024-09-28 ENCOUNTER — Inpatient Hospital Stay (HOSPITAL_COMMUNITY)

## 2024-09-28 DIAGNOSIS — M48061 Spinal stenosis, lumbar region without neurogenic claudication: Secondary | ICD-10-CM | POA: Diagnosis not present

## 2024-09-28 DIAGNOSIS — M869 Osteomyelitis, unspecified: Secondary | ICD-10-CM | POA: Diagnosis not present

## 2024-09-28 DIAGNOSIS — F17210 Nicotine dependence, cigarettes, uncomplicated: Secondary | ICD-10-CM | POA: Diagnosis not present

## 2024-09-28 DIAGNOSIS — M4646 Discitis, unspecified, lumbar region: Secondary | ICD-10-CM | POA: Diagnosis not present

## 2024-09-28 DIAGNOSIS — A491 Streptococcal infection, unspecified site: Secondary | ICD-10-CM

## 2024-09-28 DIAGNOSIS — B954 Other streptococcus as the cause of diseases classified elsewhere: Secondary | ICD-10-CM | POA: Diagnosis not present

## 2024-09-28 DIAGNOSIS — M4626 Osteomyelitis of vertebra, lumbar region: Secondary | ICD-10-CM | POA: Diagnosis not present

## 2024-09-28 DIAGNOSIS — G062 Extradural and subdural abscess, unspecified: Secondary | ICD-10-CM | POA: Diagnosis not present

## 2024-09-28 DIAGNOSIS — R7881 Bacteremia: Secondary | ICD-10-CM | POA: Diagnosis not present

## 2024-09-28 LAB — GLUCOSE, CAPILLARY: Glucose-Capillary: 111 mg/dL — ABNORMAL HIGH (ref 70–99)

## 2024-09-28 MED ORDER — OXYCODONE HCL ER 15 MG PO T12A
15.0000 mg | EXTENDED_RELEASE_TABLET | Freq: Two times a day (BID) | ORAL | Status: DC
  Administered 2024-09-28 – 2024-09-29 (×2): 15 mg via ORAL
  Filled 2024-09-28 (×2): qty 1

## 2024-09-28 MED ORDER — IOHEXOL 350 MG/ML SOLN
75.0000 mL | Freq: Once | INTRAVENOUS | Status: AC | PRN
  Administered 2024-09-28: 75 mL via INTRAVENOUS

## 2024-09-28 MED ORDER — ORAL CARE MOUTH RINSE: 15.0000 mL | OROMUCOSAL | Status: DC | PRN

## 2024-09-28 NOTE — Progress Notes (Addendum)
 " PROGRESS NOTE    Alexander Roberts  FMW:[id number] DOB: 1986/04/24 DOA: 09/24/2024 PCP: Boneta, [place]  E, PA-C   Brief Narrative:  39 year old male with no significant past medical history With exception of chronic back pain presenting with worsening back pain over the past few days with worsening weakness in the left leg.   MRI of the spine showed discitis osteomyelitis at L4-L5, epidural fluid collection consistent with epidural abscess on the bottom of L2-L3 down to under the L5 lamina on the left at L4-L5 on the right, significant spinal stenosis L3-L4 and L4-L5  Neurosurgery consulted.  Status post  Decompressive lumbar laminectomy, medial facetectomy foraminotomies L3-4 and L4-5 with evacuation of epidural abscess done on 09/24/2024  He has Streptococcus intermedius bacteremia and is currently on intravenous ceftriaxone .  Repeat cultures drawn on 1/27 showed no growth to date.  Infectious disease on board.   DC to CIR on 09/29/24.   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  He is a potential candidate for CIR - Gram stain with Strep intermedius.  Infectious disease on board.  Patient is currently on intravenous Ceftriaxone . Dced IV Daptomycin . - Continue with 0.5 to 1 mg of Dilaudid  every 2 hours for severe pain -Added 10 mg long acting oxycodone  Q12H on 1/28.  Increased dose to 15 mg BID on 1/29  Strep intermedius bacteremia,POA: On IV ceftriaxone . Dced daptomycin  on 1/28. TTE is unremarkable. Repeat blood cultures drawn on 1/27-NGTD.  Blood cultures have to be negative for 72 hours prior to PICC line placement.  Tobacco use Counseled regarding smoking cessation -continue with nicotine  patch   Multilevel spinal stenosis Acute on chronic back pain with a chronic history of multilevel spinal stenosis -with some inflammation - With new onset weakness and paresthesia -Neurosurgery on board, appreciate assistance.  Status post surgery. - Continue with intravenous Dilaudid  for severe pain  Insomnia: Continue with trazodone  100 mg at bedtime.  Vitamin D  deficiency: Started on oral cholecalciferol  1000 units daily   Adjustment disorder/depressive symptoms, POA: Psychiatry on board, appreciate assistance.  Continue with trazodone  100 mg daily at bedtime along with Duloxetine  30 mg daily with plan for up titration accordingly.  Disposition: [facility] received for [facility].  Likely discharge to CIR on 09/29/2024.  DVT prophylaxis: enoxaparin  (LOVENOX ) injection 40 mg Start: 09/28/24 0800 SCD's Start: 09/24/24 1820 Place and maintain sequential compression device Start: 09/24/24 0835 Place TED hose Start: 09/24/24 0835     Code Status: Full Code Family Communication:  Wife at the bedside Status is: Inpatient Remains inpatient appropriate because: Decompressive lumbar laminectomy, medial facetectomy foraminotomies L3-4 and L4-5 with evacuation of epidural abscess     Subjective:  No acute events overnight.  We spoke about insurance approval and he likely can be discharged to [facility] tomorrow. Only active complaints are low back pain (7/10) and inability to fall and stay asleep.  Examination:  General exam:  Appears calm and comfortable  Respiratory system: Clear to auscultation. Respiratory effort normal. Cardiovascular system: S1 & S2 heard,  RRR. No JVD, murmurs, rubs, gallops or clicks. No pedal edema. Gastrointestinal system: Abdomen is nondistended, soft and nontender. No organomegaly or masses felt. Normal bowel sounds heard. Central nervous system: Alert and oriented.  Extremities: Slightly decreased power in left leg as compared to the right leg Skin: No rashes, lesions or ulcers Psychiatry: Judgement and insight appear normal. Mood & affect appropriate.      Diet Orders (From admission, onward)     Start     Ordered   09/24/24 1851  Diet regular Room service appropriate? Yes; Fluid consistency: Thin  Diet effective now       Question Answer Comment  Room service appropriate? Yes   Fluid consistency: Thin      09/24/24 1850            Objective: Vitals:   09/27/24 1730 09/27/24 2246 09/28/24 0420 09/28/24 0500  BP: 115/70 (!) 117/58 130/87   Pulse: 81 69 71   Resp: 16 18 18    Temp: 98.1 F (36.7 C) 98.9 F (37.2 C) 98.2 F (36.8 C)   TempSrc: Oral Oral Oral   SpO2: 91% 99% 100%   Weight:    (!) 147.6 kg  Height:        Intake/Output Summary (Last 24 hours) at 09/28/2024 9277 Last data filed at 09/27/2024 2216 Gross per 24 hour  Intake --  Output 600 ml  Net -600 ml   Filed Weights   09/26/24 1713 09/27/24 0500 09/28/24 0500  Weight: (!) 145.3 kg (!) 146.2 kg (!) 147.6 kg    Scheduled Meds:  celecoxib   200 mg Oral Q12H   cholecalciferol   1,000 Units Oral Daily   DULoxetine   30 mg Oral Daily   Followed by   NOREEN ON 10/01/2024] DULoxetine   60 mg Oral Daily   enoxaparin  (LOVENOX ) injection  40 mg Subcutaneous Q24H   famotidine   20 mg Oral Daily   nicotine   14 mg Transdermal Daily   oxyCODONE   10 mg Oral Q12H   senna-docusate  1 tablet Oral BID   sodium chloride  flush  3 mL Intravenous Q12H   sodium chloride  flush  3 mL Intravenous Q12H   sodium chloride  flush  3 mL Intravenous Q12H   traZODone   100 mg Oral QHS   Continuous Infusions:  cefTRIAXone  (ROCEPHIN )  IV 2 g (09/27/24 1500)     Nutritional status     Body mass index is 40.67 kg/m.  Data Reviewed:   CBC: Recent Labs  Lab 09/24/24 0156 09/25/24 0413 09/26/24 0555  WBC 13.5* 15.5* 10.7*  NEUTROABS 10.4* 13.5* 7.4  HGB 12.7* 11.5* 11.6*  HCT 36.9* 32.6* 33.8*  MCV 80.7 79.1* 81.6  PLT 330 347 403*   Basic Metabolic Panel: Recent Labs  Lab 09/24/24 0156 09/24/24 0751 09/25/24 0413 09/26/24 0555  NA 135  --  131* 132*  K 4.3  --  4.3 4.1  CL 96*  --  93* 96*  CO2 27  --  26 24  GLUCOSE 110*  --  117* 89  BUN 11  --  13 13  CREATININE 0.78 0.72 0.76 0.70  CALCIUM  10.2  --  9.6 9.3  MG  --  1.7  --   --   PHOS  --  4.3  --   --    GFR: Estimated Creatinine Clearance: 194.3 mL/min (by C-G formula based on  SCr of 0.7 mg/dL). Liver Function Tests: Recent Labs  Lab 09/24/24 0156  AST 17  ALT 30  ALKPHOS 106  BILITOT 0.6  PROT 7.3  ALBUMIN 3.7   No results for input(s): LIPASE, AMYLASE in the last 168 hours. No results for input(s): AMMONIA in the last 168 hours. Coagulation Profile: Recent Labs  Lab 09/24/24 0156 09/25/24 0413  INR 1.0 1.1   Cardiac Enzymes: Recent Labs  Lab 09/26/24 0555  CKTOTAL 92   BNP (last 3 results) No results for input(s): PROBNP in the last 8760 hours. HbA1C: No results for input(s): HGBA1C in the last 72 hours. CBG: Recent Labs  Lab 09/25/24 0740 09/25/24 1218 09/25/24 1627 09/26/24 0755 09/27/24 0750  GLUCAP 101* 132* 101* 103* 110*   Lipid Profile: No results for input(s): CHOL, HDL, LDLCALC, TRIG, CHOLHDL, LDLDIRECT in the last 72 hours. Thyroid Function Tests: Recent Labs    09/26/24 0555  TSH 1.120   Anemia Panel: No results for input(s): VITAMINB12, FOLATE, FERRITIN, TIBC, IRON, RETICCTPCT in the last 72 hours. Sepsis Labs: Recent Labs  Lab 09/24/24 0156 09/24/24 0751  PROCALCITON  --  0.24  LATICACIDVEN 0.9  --     Recent Results (from the past 240 hours)  Blood Culture (routine  x 2)     Status: Abnormal (Preliminary result)   Collection Time: 09/24/24  1:54 AM   Specimen: BLOOD  Result Value Ref Range Status   Specimen Description   Final    BLOOD RIGHT ANTECUBITAL Performed at [facility], [address] [place], [place] [address]    Special Requests   Final    BOTTLES DRAWN AEROBIC AND ANAEROBIC Blood Culture adequate volume Performed at [facility], [address] [place], [place] [address]    Culture  Setup Time   Final    [facility] POSITIVE COCCI ANAEROBIC BOTTLE ONLY PHARMD CHARLENA SLADE [phone] @ 1948 FH    Culture (A)  Final    STREPTOCOCCUS INTERMEDIUS Sent to [facility] for further susceptibility testing. Performed at [facility], [address], [place], [place] [address]    Report Status PENDING  Incomplete  Blood Culture (routine x 2)     Status: Abnormal   Collection Time: 09/24/24  1:54 AM   Specimen: BLOOD  Result Value Ref Range Status   Specimen Description   Final    BLOOD LEFT ANTECUBITAL Performed at [facility], [address] [place], [place] [address]    Special Requests   Final    BOTTLES DRAWN AEROBIC AND ANAEROBIC Blood Culture adequate volume Performed at [facility], [address] [place], [place] [address]    Culture  Setup Time   Final    GRAM POSITIVE COCCI ANAEROBIC BOTTLE ONLY CRITICAL VALUE NOTED.  VALUE IS CONSISTENT WITH PREVIOUSLY REPORTED AND CALLED VALUE. Performed at [facility], [address], [place], [place] [address]    Culture STREPTOCOCCUS INTERMEDIUS (A)  Final   Report Status 09/27/2024 FINAL  Final  Blood Culture ID Panel (Reflexed)     Status: Abnormal   Collection Time: 09/24/24  1:54 AM  Result Value Ref Range Status   Enterococcus faecalis NOT DETECTED NOT DETECTED Final   Enterococcus Faecium NOT DETECTED NOT DETECTED Final   Listeria monocytogenes NOT DETECTED NOT DETECTED Final   Staphylococcus species NOT DETECTED NOT DETECTED  Final   Staphylococcus aureus (BCID) NOT DETECTED NOT DETECTED Final  Staphylococcus epidermidis NOT DETECTED NOT DETECTED Final   Staphylococcus lugdunensis NOT DETECTED NOT DETECTED Final   Streptococcus species DETECTED (A) NOT DETECTED Final    Comment: Not Enterococcus species, Streptococcus agalactiae, Streptococcus pyogenes, or Streptococcus pneumoniae. CRITICAL RESULT CALLED TO, READ BACK BY AND VERIFIED WITH: PHARMD ESABRA SLADE [id number] @ 1948 FH    Streptococcus agalactiae NOT DETECTED NOT DETECTED Final   Streptococcus pneumoniae NOT DETECTED NOT DETECTED Final   Streptococcus pyogenes NOT DETECTED NOT DETECTED Final   A.calcoaceticus-baumannii NOT DETECTED NOT DETECTED Final   Bacteroides fragilis NOT DETECTED NOT DETECTED Final   Enterobacterales NOT DETECTED NOT DETECTED Final   Enterobacter cloacae complex NOT DETECTED NOT DETECTED Final   Escherichia coli NOT DETECTED NOT DETECTED Final   Klebsiella aerogenes NOT DETECTED NOT DETECTED Final   Klebsiella oxytoca NOT DETECTED NOT DETECTED Final   Klebsiella pneumoniae NOT DETECTED NOT DETECTED Final   Proteus species NOT DETECTED NOT DETECTED Final   Salmonella species NOT DETECTED NOT DETECTED Final   Serratia marcescens NOT DETECTED NOT DETECTED Final   Haemophilus influenzae NOT DETECTED NOT DETECTED Final   Neisseria meningitidis NOT DETECTED NOT DETECTED Final   Pseudomonas aeruginosa NOT DETECTED NOT DETECTED Final   Stenotrophomonas maltophilia NOT DETECTED NOT DETECTED Final   Candida albicans NOT DETECTED NOT DETECTED Final   Candida auris NOT DETECTED NOT DETECTED Final   Candida glabrata NOT DETECTED NOT DETECTED Final   Candida krusei NOT DETECTED NOT DETECTED Final   Candida parapsilosis NOT DETECTED NOT DETECTED Final   Candida tropicalis NOT DETECTED NOT DETECTED Final   Cryptococcus neoformans/gattii NOT DETECTED NOT DETECTED Final    Comment: Performed at [facility], [address],  [place], [place] [address]  Surgical pcr screen     Status: None   Collection Time: 09/24/24 12:32 PM   Specimen: Nasal Mucosa; Nasal Swab  Result Value Ref Range Status   MRSA, PCR NEGATIVE NEGATIVE Final   Staphylococcus aureus NEGATIVE NEGATIVE Final    Comment: (NOTE) The Xpert SA Assay (FDA approved for NASAL specimens in patients 3 years of age and older), is one component of a comprehensive surveillance program. It is not intended to diagnose infection nor to guide or monitor treatment. Performed at [facility], [address], [place], [place] [address]   Aerobic/Anaerobic Culture w Gram Stain (surgical/deep wound)     Status: None (Preliminary result)   Collection Time: 09/24/24  2:14 PM   Specimen: Abscess  Result Value Ref Range Status   Specimen Description ABSCESS  Final   Special Requests LUMBAR FOUR FIVE EPIDURAL ABSCESS  Final   Gram Stain   Final    RARE WBC PRESENT, PREDOMINANTLY PMN RARE GRAM POSITIVE COCCI    Culture   Final    FEW STREPTOCOCCUS INTERMEDIUS SUSCEPTIBILITIES TO FOLLOW Performed at [facility], [address] [place], [place] [address]    Report Status PENDING  Incomplete  Aerobic/Anaerobic Culture w Gram Stain (surgical/deep wound)     Status: None (Preliminary result)   Collection Time: 09/24/24  2:21 PM   Specimen: Abscess  Result Value Ref Range Status   Specimen Description ABSCESS  Final   Special Requests LUMBAR FOUR FIVE EPIDURAL ABSCESS  Final   Gram Stain NO WBC SEEN NO ORGANISMS SEEN   Final   Culture   Final    FEW STREPTOCOCCUS CONSTELLATUS SUSCEPTIBILITIES TO FOLLOW Performed at [facility], [address], [place], [place]  [address]    Report Status PENDING  Incomplete  Culture, blood (Routine X 2) w Reflex to ID Panel     Status: None (Preliminary result)   Collection Time: 09/26/24  9:49 AM   Specimen: BLOOD LEFT HAND  Result Value Ref Range Status   Specimen Description BLOOD LEFT HAND  Final    Special Requests   Final    BOTTLES DRAWN AEROBIC ONLY Blood Culture adequate volume   Culture   Final    NO GROWTH < 24 HOURS Performed at [facility], [address], [place], [place] [address]    Report Status PENDING  Incomplete  Culture, blood (Routine X 2) w Reflex to ID Panel     Status: None (Preliminary result)   Collection Time: 09/26/24  9:49 AM   Specimen: BLOOD RIGHT ARM  Result Value Ref Range Status   Specimen Description BLOOD RIGHT ARM  Final   Special Requests   Final    BOTTLES DRAWN AEROBIC ONLY Blood Culture adequate volume   Culture   Final    NO GROWTH < 24 HOURS Performed at [facility], [address], [place], [place] [address]    Report Status PENDING  Incomplete         Radiology Studies: ECHOCARDIOGRAM COMPLETE Result Date: 09/27/2024    ECHOCARDIOGRAM REPORT   Patient Name:   Alexander Roberts Date of Exam: 09/27/2024 Medical Rec #:  [id number]       Height:       75.0 in Accession #:    [id number]      Weight:       322.3 lb Date of Birth:  Jun 25, 1986       BSA:          2.688 m Patient Age:    38 years        BP:           149/94 mmHg Patient Gender: M               HR:           76 bpm. Exam Location:  Inpatient Procedure: 2D Echo, Cardiac Doppler and Color Doppler (Both Spectral and Color            Flow Doppler were utilized during procedure). Indications:    Bacteremia R78.81  History:        Patient has no prior history of Echocardiogram examinations.                 Signs/Symptoms:Hypertensive Heart Disease.  Sonographer:    Nathanel Devonshire Referring Phys: [id number] [facility] IMPRESSIONS  1. Left ventricular ejection fraction, by estimation, is 65 to 70%. The left ventricle has normal function. The left ventricle has no regional wall motion abnormalities. Left ventricular diastolic parameters were normal.  2. Right ventricular systolic function is normal. The right ventricular size is normal.  3. The mitral valve is normal in structure.  Trivial mitral valve regurgitation. No evidence of mitral stenosis.  4. The aortic valve is normal in structure. Aortic valve regurgitation is not visualized. No aortic stenosis is present.  5. The inferior vena cava is normal in size with greater than 50% respiratory variability, suggesting right atrial pressure of 3 mmHg. Conclusion(s)/Recommendation(s): Technically limited study due to poor sound wave transmission. FINDINGS  Left Ventricle: Left ventricular ejection fraction, by estimation, is 65 to 70%. The left ventricle has normal function. The left ventricle has no regional wall motion abnormalities.  The left ventricular internal cavity size was normal in size. There is  no left ventricular hypertrophy. Left ventricular diastolic parameters were normal. Right Ventricle: The right ventricular size is normal. No increase in right ventricular wall thickness. Right ventricular systolic function is normal. Left Atrium: Left atrial size was normal in size. Right Atrium: Right atrial size was normal in size. Pericardium: There is no evidence of pericardial effusion. Mitral Valve: The mitral valve is normal in structure. Trivial mitral valve regurgitation. No evidence of mitral valve stenosis. Tricuspid Valve: The tricuspid valve is normal in structure. Tricuspid valve regurgitation is trivial. No evidence of tricuspid stenosis. Aortic Valve: The aortic valve is normal in structure. Aortic valve regurgitation is not visualized. No aortic stenosis is present. Aortic valve mean gradient measures 7.0 mmHg. Aortic valve peak gradient measures 16.2 mmHg. Aortic valve area, by VTI measures 2.56 cm. Pulmonic Valve: The pulmonic valve was not well visualized. Pulmonic valve regurgitation is not visualized. No evidence of pulmonic stenosis. Aorta: The aortic root is normal in size and structure. Venous: The inferior vena cava is normal in size with greater than 50% respiratory variability, suggesting right atrial pressure of  3 mmHg. IAS/Shunts: No atrial level shunt detected by color flow Doppler.  LEFT VENTRICLE PLAX 2D LVIDd:         4.20 cm      Diastology LVIDs:         2.90 cm      LV e' medial:    7.94 cm/s LV PW:         1.10 cm      LV E/e' medial:  9.3 LV IVS:        1.10 cm      LV e' lateral:   10.70 cm/s LVOT diam:     2.00 cm      LV E/e' lateral: 6.9 LV SV:         77 LV SV Index:   29 LVOT Area:     3.14 cm  LV Volumes (MOD) LV vol d, MOD A2C: 98.4 ml LV vol d, MOD A4C: 133.0 ml LV vol s, MOD A2C: 37.4 ml LV vol s, MOD A4C: 33.9 ml LV SV MOD A2C:     61.0 ml LV SV MOD A4C:     133.0 ml LV SV MOD BP:      80.9 ml RIGHT VENTRICLE         IVC TAPSE (M-mode): 2.2 cm  IVC diam: 1.70 cm LEFT ATRIUM             Index LA diam:        3.30 cm 1.23 cm/m LA Vol (A2C):   47.3 ml 17.60 ml/m LA Vol (A4C):   27.9 ml 10.38 ml/m LA Biplane Vol: 37.2 ml 13.84 ml/m  AORTIC VALVE                     PULMONIC VALVE AV Area (Vmax):    2.11 cm      PV Vmax:       1.39 m/s AV Area (Vmean):   2.35 cm      PV Peak grad:  7.7 mmHg AV Area (VTI):     2.56 cm AV Vmax:           201.00 cm/s AV Vmean:          116.000 cm/s AV VTI:            0.299 m AV  Peak Grad:      16.2 mmHg AV Mean Grad:      7.0 mmHg LVOT Vmax:         135.00 cm/s LVOT Vmean:        86.600 cm/s LVOT VTI:          0.244 m LVOT/AV VTI ratio: 0.82  AORTA Ao Root diam: 3.20 cm Ao Asc diam:  2.90 cm MITRAL VALVE MV Area (PHT): 2.91 cm    SHUNTS MV Decel Time: 261 msec    Systemic VTI:  0.24 m MV E velocity: 74.20 cm/s  Systemic Diam: 2.00 cm MV A velocity: 61.10 cm/s MV E/A ratio:  1.21 Toribio Fuel MD Electronically signed by Toribio Fuel MD Signature Date/Time: 09/27/2024/3:23:56 PM    Final             LOS: 4 days   Time spent= 35 mins    Deliliah Room, MD [facility]  If 7PM-7AM, please contact night-coverage  09/28/2024, 7:22 AM  "

## 2024-09-28 NOTE — Progress Notes (Signed)
 Physical Therapy Treatment Patient Details Name: Alexander Roberts MRN: [id number] DOB: 1986-04-22 Today's Date: 09/28/2024   History of Present Illness Pt is a 39 y.o. 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 S/p decompressive lumbar laminectomy, medial facetectomy foraminotomies L3-4 and L4-5 with evacuation of epidural abscess 1/25. Significant PMH: none.    PT Comments  Pt making steady progress towards his physical therapy goals and is eager to participate. Reports RLE radicular pain and cramping. PT performed manual stretching to bilateral hamstrings and glutes. Pt able to transition to edge of bed via log roll without physical assist. Pt ambulating 75 ft with RW and step to gait pattern. Demonstrates increased L knee hyperextension during stance phase today and tactile feedback provided. Worked on weight shifting and targeted stepping as well. Patient will benefit from intensive inpatient follow-up therapy, >3 hours/day.    If plan is discharge home, recommend the following: A lot of help with bathing/dressing/bathroom;A little help with walking and/or transfers   Can travel by private vehicle        Equipment Recommendations  BSC/3in1    Recommendations for Other Services Rehab consult     Precautions / Restrictions Precautions Precautions: Fall;Back Precaution Booklet Issued: No Recall of Precautions/Restrictions: Intact Precaution/Restrictions Comments: Hemovac Restrictions Weight Bearing Restrictions Per Provider Order: No     Mobility  Bed Mobility Overal bed mobility: Needs Assistance Bed Mobility: Rolling, Sidelying to Sit, Sit to Sidelying Rolling: Supervision Sidelying to sit: Supervision     Sit to sidelying: Min assist General bed mobility comments: HOB flat, good log roll technique,  increased time to transition LLE off edge of bed, heavy use of UE to sit upright but no physical assist. MinA for LLE negotiation back into bed    Transfers Overall transfer level: Needs assistance Equipment used: Rolling walker (2 wheels) Transfers: Sit to/from Stand Sit to Stand: Contact guard assist           General transfer comment: Use of momentum to power up    Ambulation/Gait Ambulation/Gait assistance: Contact guard assist Gait Distance (Feet): 75 Feet Assistive device: Rolling walker (2 wheels) Gait Pattern/deviations: Step-to pattern, Decreased dorsiflexion - left, Decreased stance time - left Gait velocity: decreased     General Gait Details: Pt self cueing for smaller steps for increased control, tactile cues provided to decrease L knee hyperextension. Heavy use of UE's on RW.   Stairs             Wheelchair Mobility     Tilt Bed    Modified Rankin (Stroke Patients Only)       Balance Overall balance assessment: Needs assistance Sitting-balance support: Feet supported Sitting balance-Leahy Scale: Good     Standing balance support: Bilateral upper extremity supported Standing balance-Leahy Scale: Poor                              Communication Communication Communication: No apparent difficulties  Cognition Arousal: Alert Behavior During Therapy: WFL for tasks assessed/performed   PT - Cognitive impairments: No apparent impairments                         Following commands: Intact      Cueing Cueing Techniques: Verbal cues  Exercises Other Exercises Other Exercises:  Supine: bilateral knee to chest and hamstring stretch x 1 minute each Other Exercises: Standing: L toe taps, on line for target practice, weight shifting to R/l    General Comments        Pertinent Vitals/Pain Pain Assessment Pain Assessment: Faces Faces Pain Scale: Hurts even more Pain Location: back, radicular pain to RLE Pain  Descriptors / Indicators: Operative site guarding, Grimacing, Moaning Pain Intervention(s): Monitored during session, Patient requesting pain meds-RN notified    Home Living                          Prior Function            PT Goals (current goals can now be found in the care plan section) Acute Rehab PT Goals Patient Stated Goal: to return to work, fishing PT Goal Formulation: With patient Time For Goal Achievement: 10/09/24 Potential to Achieve Goals: Good Progress towards PT goals: Progressing toward goals    Frequency    Min 5X/week      PT Plan      Co-evaluation              AM-PAC PT 6 Clicks Mobility   Outcome Measure  Help needed turning from your back to your side while in a flat bed without using bedrails?: A Little Help needed moving from lying on your back to sitting on the side of a flat bed without using bedrails?: A Little Help needed moving to and from a bed to a chair (including a wheelchair)?: A Little Help needed standing up from a chair using your arms (e.g., wheelchair or bedside chair)?: A Little Help needed to walk in hospital room?: A Little Help needed climbing 3-5 steps with a railing? : A Lot 6 Click Score: 17    End of Session Equipment Utilized During Treatment: Gait belt Activity Tolerance: Patient tolerated treatment well Patient left: with call bell/phone within reach;with family/visitor present;in bed Nurse Communication: Mobility status PT Visit Diagnosis: Unsteadiness on feet (R26.81);Difficulty in walking, not elsewhere classified (R26.2);Pain Pain - part of body:  (back)     Time: 8574-8544 PT Time Calculation (min) (ACUTE ONLY): 30 min  Charges:    $Therapeutic Activity: 23-37 mins PT General Charges $$ ACUTE PT VISIT: 1 Visit                     [name], PT, DPT Acute Rehabilitation Services Office [phone]    [name] 09/28/2024, 3:29 PM

## 2024-09-28 NOTE — Plan of Care (Signed)
   Problem: Education: Goal: Knowledge of General Education information will improve Description Including pain rating scale, medication(s)/side effects and non-pharmacologic comfort measures Outcome: Progressing   Problem: Elimination: Goal: Will not experience complications related to urinary retention Outcome: Progressing   Problem: Safety: Goal: Ability to remain free from injury will improve Outcome: Progressing

## 2024-09-28 NOTE — Plan of Care (Signed)

## 2024-09-28 NOTE — Progress Notes (Signed)
 "                                                            [facility] Infectious Diseases Follow Up Note  Patient Identification: Patient Name: Alexander Roberts MRN: [id number] Admit Date: 09/24/2024  1:19 AM Age: 39 y.o.Today's Date: 09/28/2024  Reason for Visit: Streptococcus intermedius bacteremia, vertebral infection  Principal Problem:   Osteomyelitis (HCC) Active Problems:   Discitis of lumbar region   Multilevel spinal stenosis   Tobacco use   Back pain   Discitis   Epidural abscess   Acute osteomyelitis of lumbar spine (HCC)   Bacteremia due to Streptococcus  Antibiotics: IV ceftriaxone  1/26- Total days of antibiotics 6  Lines/Hardwares:   Interval Events: Remains afebrile  Assessment 39 year old male with prior history as below who presented to the ED with with worsening of  lower back pain for a week PTA, progressive weakness in the left lower extremity, right lower extremity as well as numbness in the bilateral lower extremities.  He reports history of preceding epidural injection approximately 2 years ago, was also on gabapentin , meloxicam  at that time but back pain was resolved for approximately 2 years until a week prior to ED arrival.  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 Or cx Gram stain GPC, cx strep intermedius/strep constellatus - Per OR note, significant amount of ventral epidural pus at L3-4 and L4-5 on the left  - 1/25 ESR 55, CRP 40.9   # Smoker - to be counseled    # H/o prior low back pain and epidural injection 2 years ago  Recommendations -  continue IV ceftriaxone , fu sensi's - Follow-up blood cultures 1/27 to be negative for at least 72 hours before PICC placement - No need for TEE repeat blood cultures today negative as low risk organism for endocarditis - CT abdomen pelvis w contrast to rule out any intra-abdominal abscess due to high association with pyogenic infection with strep intermedius.  - Monitor CBC and CMP - Postop care per neurosurgery -Universal/standard isolation precautions Following  Rest of the management as per the primary team. Thank you for the consult. Please page with pertinent questions or concerns.  ______________________________________________________________________ Subjective patient seen and examined at the bedside.  Did not sleep well.  Back pain is better.  Still has some numbness/weakness in left leg.   Past Medical History:  Diagnosis Date   Medical history non-contributory    Past Surgical History:  Procedure Laterality Date   EXCISION OF TONGUE LESION N/A 10/23/2019   Procedure: RESECTION OF THYROGLOSSAL REMNANT IN BASE OF TONGUE;  Surgeon: Jesus Oliphant, MD;  Location: [facility] OR;  Service: ENT;  Laterality: N/A;   LUMBAR LAMINECTOMY/DECOMPRESSION MICRODISCECTOMY N/A 09/24/2024   Procedure: LUMBAR LAMINECTOMY/DECOMPRESSION MICRODISCECTOMY 1 LEVEL;  Surgeon: Matheus Alm Hamilton, MD;  Location: [facility] OR;  Service: Neurosurgery;  Laterality: N/A;  Lumbar Three - Four, Lumbar Four - Five Laminectomy  NO PAST SURGERIES     Vitals BP 130/87 (BP Location: Right Wrist)   Pulse 71   Temp 98.2 F (36.8 C) (Oral)   Resp 18   Ht 6' 3 (1.905 m)   Wt (!) 146.2 kg   SpO2 100%   BMI 40.29 kg/m     Physical Exam Constitutional: Lying in the bed, nontoxic-appearing, morbidly obese    Comments:   Cardiovascular:     Rate and Rhythm: Normal rate     Heart sounds:   Pulmonary:     Effort: Pulmonary effort is normal.     Comments:   Abdominal:     Palpations: Abdomen is nondistended     Tenderness:   Musculoskeletal:        General: No swelling or tenderness in peripheral joints  Skin:    Comments: Back with a Hemovac, left leg weakness 4+/5, RT leg 5/5  Neurological:     General: Alert, awake  Psychiatric:        Mood and Affect: Mood normal.   Pertinent Microbiology Results for orders placed or performed during the hospital encounter of 09/24/24  Blood Culture (routine x 2)     Status: Abnormal (Preliminary result)   Collection Time: 09/24/24  1:54 AM   Specimen: BLOOD  Result Value Ref Range Status   Specimen Description   Final    BLOOD RIGHT ANTECUBITAL Performed at [facility], [address] [place], [place] [address]    Special Requests   Final    BOTTLES DRAWN AEROBIC AND ANAEROBIC Blood Culture adequate volume Performed at [facility], [address] [place], [place] [address]    Culture  Setup Time   Final    [facility] POSITIVE COCCI ANAEROBIC BOTTLE ONLY PHARMD CHARLENA SLADE [phone] @ 1948 FH    Culture (A)  Final    STREPTOCOCCUS INTERMEDIUS Sent to [facility] for further susceptibility testing. Performed at [facility], [address], [place], [place] [address]    Report Status PENDING  Incomplete  Blood Culture (routine x 2)     Status: Abnormal   Collection Time: 09/24/24  1:54 AM   Specimen: BLOOD  Result Value Ref Range Status   Specimen Description   Final    BLOOD LEFT ANTECUBITAL Performed at [facility], [address] [place], [place] [address]    Special Requests   Final    BOTTLES DRAWN AEROBIC AND ANAEROBIC Blood Culture adequate volume Performed at [facility], [address] [place], [place] [address]    Culture  Setup Time   Final    GRAM POSITIVE COCCI ANAEROBIC BOTTLE ONLY CRITICAL VALUE NOTED.  VALUE IS CONSISTENT WITH PREVIOUSLY REPORTED AND CALLED VALUE. Performed at [facility], [address] [place], [place] [address]    Culture STREPTOCOCCUS  INTERMEDIUS (A)  Final   Report Status 09/27/2024 FINAL  Final  Blood Culture ID Panel (Reflexed)     Status: Abnormal   Collection Time: 09/24/24  1:54 AM  Result Value Ref Range Status   Enterococcus faecalis NOT DETECTED NOT DETECTED Final   Enterococcus Faecium NOT DETECTED NOT DETECTED Final   Listeria monocytogenes NOT DETECTED NOT DETECTED Final   Staphylococcus species NOT DETECTED NOT DETECTED Final   Staphylococcus aureus (BCID) NOT DETECTED NOT DETECTED Final   Staphylococcus epidermidis NOT DETECTED NOT DETECTED Final   Staphylococcus lugdunensis NOT DETECTED NOT DETECTED Final  Streptococcus species DETECTED (A) NOT DETECTED Final    Comment: Not Enterococcus species, Streptococcus agalactiae, Streptococcus pyogenes, or Streptococcus pneumoniae. CRITICAL RESULT CALLED TO, READ BACK BY AND VERIFIED WITH: PHARMD ESABRA SLADE [id number] @ 1948 FH    Streptococcus agalactiae NOT DETECTED NOT DETECTED Final   Streptococcus pneumoniae NOT DETECTED NOT DETECTED Final   Streptococcus pyogenes NOT DETECTED NOT DETECTED Final   A.calcoaceticus-baumannii NOT DETECTED NOT DETECTED Final   Bacteroides fragilis NOT DETECTED NOT DETECTED Final   Enterobacterales NOT DETECTED NOT DETECTED Final   Enterobacter cloacae complex NOT DETECTED NOT DETECTED Final   Escherichia coli NOT DETECTED NOT DETECTED Final   Klebsiella aerogenes NOT DETECTED NOT DETECTED Final   Klebsiella oxytoca NOT DETECTED NOT DETECTED Final   Klebsiella pneumoniae NOT DETECTED NOT DETECTED Final   Proteus species NOT DETECTED NOT DETECTED Final   Salmonella species NOT DETECTED NOT DETECTED Final   Serratia marcescens NOT DETECTED NOT DETECTED Final   Haemophilus influenzae NOT DETECTED NOT DETECTED Final   Neisseria meningitidis NOT DETECTED NOT DETECTED Final   Pseudomonas aeruginosa NOT DETECTED NOT DETECTED Final   Stenotrophomonas maltophilia NOT DETECTED NOT DETECTED Final   Candida albicans NOT DETECTED NOT  DETECTED Final   Candida auris NOT DETECTED NOT DETECTED Final   Candida glabrata NOT DETECTED NOT DETECTED Final   Candida krusei NOT DETECTED NOT DETECTED Final   Candida parapsilosis NOT DETECTED NOT DETECTED Final   Candida tropicalis NOT DETECTED NOT DETECTED Final   Cryptococcus neoformans/gattii NOT DETECTED NOT DETECTED Final    Comment: Performed at [facility], [address], [place], [place] [address]  Surgical pcr screen     Status: None   Collection Time: 09/24/24 12:32 PM   Specimen: Nasal Mucosa; Nasal Swab  Result Value Ref Range Status   MRSA, PCR NEGATIVE NEGATIVE Final   Staphylococcus aureus NEGATIVE NEGATIVE Final    Comment: (NOTE) The Xpert SA Assay (FDA approved for NASAL specimens in patients 35 years of age and older), is one component of a comprehensive surveillance program. It is not intended to diagnose infection nor to guide or monitor treatment. Performed at [facility], [address], [place], [place] [address]   Aerobic/Anaerobic Culture w Gram Stain (surgical/deep wound)     Status: None (Preliminary result)   Collection Time: 09/24/24  2:14 PM   Specimen: Abscess  Result Value Ref Range Status   Specimen Description ABSCESS  Final   Special Requests LUMBAR FOUR FIVE EPIDURAL ABSCESS  Final   Gram Stain   Final    RARE WBC PRESENT, PREDOMINANTLY PMN RARE GRAM POSITIVE COCCI    Culture   Final    FEW STREPTOCOCCUS INTERMEDIUS SUSCEPTIBILITIES TO FOLLOW Performed at [facility], [address] [place], [place] [address]    Report Status PENDING  Incomplete  Aerobic/Anaerobic Culture w Gram Stain (surgical/deep wound)     Status: None (Preliminary result)   Collection Time: 09/24/24  2:21 PM   Specimen: Abscess  Result Value Ref Range Status   Specimen Description ABSCESS  Final   Special Requests LUMBAR FOUR FIVE EPIDURAL ABSCESS  Final   Gram Stain NO WBC SEEN NO ORGANISMS SEEN   Final   Culture   Final    FEW  STREPTOCOCCUS CONSTELLATUS SUSCEPTIBILITIES TO FOLLOW Performed at [facility], [address] [place], [place] [address]    Report Status PENDING  Incomplete  Culture, blood (Routine X 2) w Reflex  to ID Panel     Status: None (Preliminary result)   Collection Time: 09/26/24  9:49 AM   Specimen: BLOOD LEFT HAND  Result Value Ref Range Status   Specimen Description BLOOD LEFT HAND  Final   Special Requests   Final    BOTTLES DRAWN AEROBIC ONLY Blood Culture adequate volume   Culture   Final    NO GROWTH < 24 HOURS Performed at [facility], [address] [place], [place] [address]    Report Status PENDING  Incomplete  Culture, blood (Routine X 2) w Reflex to ID Panel     Status: None (Preliminary result)   Collection Time: 09/26/24  9:49 AM   Specimen: BLOOD RIGHT ARM  Result Value Ref Range Status   Specimen Description BLOOD RIGHT ARM  Final   Special Requests   Final    BOTTLES DRAWN AEROBIC ONLY Blood Culture adequate volume   Culture   Final    NO GROWTH < 24 HOURS Performed at [facility], [address] [place], [place] [address]    Report Status PENDING  Incomplete   Pertinent Lab.    Latest Ref Rng & Units 09/26/2024    5:55 AM 09/25/2024    4:13 AM 09/24/2024    1:56 AM  CBC  WBC 4.0 - 10.5 K/uL 10.7  15.5  13.5   Hemoglobin 13.0 - 17.0 g/dL 88.3  88.4  87.2   Hematocrit 39.0 - 52.0 % 33.8  32.6  36.9   Platelets 150 - 400 K/uL 403  347  330       Latest Ref Rng & Units 09/26/2024    5:55 AM 09/25/2024    4:13 AM 09/24/2024    7:51 AM  CMP  Glucose 70 - 99 mg/dL 89  882    BUN 6 - 20 mg/dL 13  13    Creatinine 9.38 - 1.24 mg/dL 9.29  9.23  9.27   Sodium 135 - 145 mmol/L 132  131    Potassium 3.5 - 5.1 mmol/L 4.1  4.3    Chloride 98 - 111 mmol/L 96  93    CO2 22 - 32 mmol/L 24  26    Calcium  8.9 - 10.3 mg/dL 9.3  9.6       Pertinent Imaging today Plain films and CT images have been personally visualized and interpreted; radiology  reports have been reviewed. Decision making incorporated into the Impression /   ECHOCARDIOGRAM COMPLETE Result Date: 09/27/2024    ECHOCARDIOGRAM REPORT   Patient Name:   KAEDON FANELLI Date of Exam: 09/27/2024 Medical Rec #:  [id number]       Height:       75.0 in Accession #:    [id number]      Weight:       322.3 lb Date of Birth:  July 24, 1986       BSA:          2.688 m Patient Age:    38 years        BP:           149/94 mmHg Patient Gender: M               HR:           76 bpm. Exam Location:  Inpatient Procedure: 2D Echo, Cardiac Doppler and Color Doppler (Both Spectral and Color            Flow Doppler were utilized during  procedure). Indications:    Bacteremia R78.81  History:        Patient has no prior history of Echocardiogram examinations.                 Signs/Symptoms:Hypertensive Heart Disease.  Sonographer:    Nathanel Devonshire Referring Phys: [id number] [facility] IMPRESSIONS  1. Left ventricular ejection fraction, by estimation, is 65 to 70%. The left ventricle has normal function. The left ventricle has no regional wall motion abnormalities. Left ventricular diastolic parameters were normal.  2. Right ventricular systolic function is normal. The right ventricular size is normal.  3. The mitral valve is normal in structure. Trivial mitral valve regurgitation. No evidence of mitral stenosis.  4. The aortic valve is normal in structure. Aortic valve regurgitation is not visualized. No aortic stenosis is present.  5. The inferior vena cava is normal in size with greater than 50% respiratory variability, suggesting right atrial pressure of 3 mmHg. Conclusion(s)/Recommendation(s): Technically limited study due to poor sound wave transmission. FINDINGS  Left Ventricle: Left ventricular ejection fraction, by estimation, is 65 to 70%. The left ventricle has normal function. The left ventricle has no regional wall motion abnormalities. The left ventricular internal cavity size was normal in size. There is   no left ventricular hypertrophy. Left ventricular diastolic parameters were normal. Right Ventricle: The right ventricular size is normal. No increase in right ventricular wall thickness. Right ventricular systolic function is normal. Left Atrium: Left atrial size was normal in size. Right Atrium: Right atrial size was normal in size. Pericardium: There is no evidence of pericardial effusion. Mitral Valve: The mitral valve is normal in structure. Trivial mitral valve regurgitation. No evidence of mitral valve stenosis. Tricuspid Valve: The tricuspid valve is normal in structure. Tricuspid valve regurgitation is trivial. No evidence of tricuspid stenosis. Aortic Valve: The aortic valve is normal in structure. Aortic valve regurgitation is not visualized. No aortic stenosis is present. Aortic valve mean gradient measures 7.0 mmHg. Aortic valve peak gradient measures 16.2 mmHg. Aortic valve area, by VTI measures 2.56 cm. Pulmonic Valve: The pulmonic valve was not well visualized. Pulmonic valve regurgitation is not visualized. No evidence of pulmonic stenosis. Aorta: The aortic root is normal in size and structure. Venous: The inferior vena cava is normal in size with greater than 50% respiratory variability, suggesting right atrial pressure of 3 mmHg. IAS/Shunts: No atrial level shunt detected by color flow Doppler.  LEFT VENTRICLE PLAX 2D LVIDd:         4.20 cm      Diastology LVIDs:         2.90 cm      LV e' medial:    7.94 cm/s LV PW:         1.10 cm      LV E/e' medial:  9.3 LV IVS:        1.10 cm      LV e' lateral:   10.70 cm/s LVOT diam:     2.00 cm      LV E/e' lateral: 6.9 LV SV:         77 LV SV Index:   29 LVOT Area:     3.14 cm  LV Volumes (MOD) LV vol d, MOD A2C: 98.4 ml LV vol d, MOD A4C: 133.0 ml LV vol s, MOD A2C: 37.4 ml LV vol s, MOD A4C: 33.9 ml LV SV MOD A2C:     61.0 ml LV SV MOD A4C:     133.0  ml LV SV MOD BP:      80.9 ml RIGHT VENTRICLE         IVC TAPSE (M-mode): 2.2 cm  IVC diam: 1.70 cm  LEFT ATRIUM             Index LA diam:        3.30 cm 1.23 cm/m LA Vol (A2C):   47.3 ml 17.60 ml/m LA Vol (A4C):   27.9 ml 10.38 ml/m LA Biplane Vol: 37.2 ml 13.84 ml/m  AORTIC VALVE                     PULMONIC VALVE AV Area (Vmax):    2.11 cm      PV Vmax:       1.39 m/s AV Area (Vmean):   2.35 cm      PV Peak grad:  7.7 mmHg AV Area (VTI):     2.56 cm AV Vmax:           201.00 cm/s AV Vmean:          116.000 cm/s AV VTI:            0.299 m AV Peak Grad:      16.2 mmHg AV Mean Grad:      7.0 mmHg LVOT Vmax:         135.00 cm/s LVOT Vmean:        86.600 cm/s LVOT VTI:          0.244 m LVOT/AV VTI ratio: 0.82  AORTA Ao Root diam: 3.20 cm Ao Asc diam:  2.90 cm MITRAL VALVE MV Area (PHT): 2.91 cm    SHUNTS MV Decel Time: 261 msec    Systemic VTI:  0.24 m MV E velocity: 74.20 cm/s  Systemic Diam: 2.00 cm MV A velocity: 61.10 cm/s MV E/A ratio:  1.21 Toribio Fuel MD Electronically signed by Toribio Fuel MD Signature Date/Time: 09/27/2024/3:23:56 PM    Final    I personally spent a total of 50 minutes in the care of the patient today including preparing to see the patient, getting/reviewing separately obtained history, performing a medically appropriate exam/evaluation, counseling and educating, placing orders, documenting clinical information in the EHR, independently interpreting results, and communicating results.  Electronically signed by:  Annalee Orem, MD Infectious Disease Physician [facility] for Infectious Disease Pager: [phone]  "

## 2024-09-28 NOTE — Progress Notes (Signed)
" ° °  Inpatient Rehabilitation Admissions Coordinator   I have insurance approval and CIR bed to admit him to on Friday. I met with patient and his wife at bedside and they are aware and in agreement. Acute team and TOC made aware. Patient states his med given to him to assist with sleep at night is not working and is causing a lot of frustration. I will follow up in the am.  Heron Leavell, RN, MSN Rehab Admissions Coordinator [phone] 09/28/2024 11:14 AM  "

## 2024-09-28 NOTE — Consult Note (Signed)
 [facility] Psychiatric Consult Initial  Patient Name: .Alexander Roberts  MRN: [id number]  DOB: 08/18/1986  Consult Order details:  Orders (From admission, onward)     Start     Ordered   09/25/24 1134  IP CONSULT TO PSYCHIATRY       Ordering Provider: Dino Antu, MD  Provider:  (Not yet assigned)  Question Answer Comment  Location [facility]   Reason for Consult? 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  Tobacco use disorder  Assessment  Seabron Iannello is a 39 y.o. male admitted: Medicallyfor 09/24/2024  1:19 AM for acute on chronic back pain - shown to have discitis and osteomyelitis of the lumbar region. He carries no psychiatric diagnoses  and has a past medical history of chronic back pain.   His current presentation of low mood, guilt/worthlessness, racing thoughts, irritability, insomnia, and low frustration tolerance is most consistent with adjustment disorder secondary to another medical condition (acute on chronic back pain). He meets criteria for adjustment disorder based on history and timing of symptoms as per DSM V TR. Current outpatient psychotropic medications include none. On initial examination, patient was agreeable to the discussion. He cited significant difficulties in managing his mood given his worsening pain that pre-occupies him. Please see plan below for detailed recommendations.   1/28: Patient seen and reassessed bedside. He reported some improvement in his mood symptoms, though he still has significant anxiety about when the next spike of pain will occur. He continued to have poor sleep after the first 50 mg of trazodone , so increased the dosage to 100 mg. Patient did experience some  nausea but no vomiting, reinforced that this is a side effect that can happen with the administration of SNRI's, but that this discomfort usually decreases after a few days. Denies SI/HI/AVH or other first-rank symptoms.  Diagnoses:  Active Hospital problems: Principal Problem:   Osteomyelitis (HCC) Active Problems:   Discitis of lumbar region   Multilevel spinal stenosis   Tobacco use   Back pain   Discitis   Epidural abscess   Acute osteomyelitis of lumbar spine (HCC)   Bacteremia due to Streptococcus    Plan   ## Psychiatric Medication Recommendations:  -- Start duloxetine  30 mg for depression symptoms, chronic pain  -- Titrate upwards in 5 days. Dosage can go as high as 120 mg daily for chronic pain -- Increase trazodone  to 100 mg bedtime for sleep  ## Medical Decision Making Capacity: Not specifically addressed in this encounter  ## Further Work-up:  -- Add on vitamin D , TSH -- most recent EKG on 1/25 had QtC of 417 -- Pertinent labwork reviewed earlier this admission includes: CBC, CMP,    ## Disposition:-- There are no psychiatric contraindications to discharge at this time  ## Behavioral / Environmental: - No specific recommendations at this time.     ## Safety and Observation Level:  - Based on my clinical evaluation, I estimate the patient to be at low risk of self harm in the current setting. - At this time, we recommend  routine. This decision is based on my review of the chart including patient's history and current presentation, interview of the patient, mental status  examination, and consideration of suicide risk including evaluating suicidal ideation, plan, intent, suicidal or self-harm behaviors, risk factors, and protective factors. This judgment is based on our ability to directly address suicide risk, implement suicide prevention strategies, and develop a safety plan while the patient is in the clinical setting. Please contact our team if there is a concern  that risk level has changed.  CSSR Risk Category:C-SSRS RISK CATEGORY: No Risk  Suicide Risk Assessment: Patient has following modifiable risk factors for suicide: untreated depression, lack of access to outpatient mental health resources, and pain, medical illness (ie new dx of cancer), which we are addressing by adding multimodal pain control, recommending therapists, group therapy options. Patient has following non-modifiable or demographic risk factors for suicide: male gender Patient has the following protective factors against suicide: Access to outpatient mental health care, Supportive family, no history of suicide attempts, and no history of NSSIB  Thank you for this consult request. Recommendations have been communicated to the primary team.  We will sign off at this time.   Lynwood Morene Lavone Delsie, MD       History of Present Illness  Relevant Aspects of Hospital Course:  Admitted on 09/24/2024 for osteomyelitis of lumbar region. They required laminectomy and surgical debridement for culture..   Patient Report:  Patient reports that he has long dealt with back pain that often impairs his ability to do his work (he is a production designer, theatre/television/film at [employer]), his hobbies (avid fisherman), and his ability to perform normal ADLs. He also cites that this is a problem that has been detrimental to his relationship with his spouse. He says that things have been much worse in the last week: he cites insomnia, poor mood, racing thoughts, preoccupation with symptoms, sense of worthlessness, distractibility.    Psych ROS:  Depression: low mood, anhedonia (and inability), distractibility,  Anxiety:  muscle tension, racing thoughts, insomnia Mania (lifetime and current): never. Insomnia is secondary to pain and racing thoughts. Patient feels exhausted rather than energetic Psychosis: (lifetime and current): denies all other than when he was on high doses of pain medication several days  ago.  Collateral information:  Contacted patient's wife at bedside. She spoke with me outside the room and generally corroborated the patient's history as above. She was less concerned about his mood swings than the patient was. She did report that it has been difficult to watch the self-sufficient patient need to ask for help.  Review of Systems  Constitutional:  Positive for weight loss.  Musculoskeletal:  Positive for back pain.  Neurological:  Negative for headaches.  Psychiatric/Behavioral:  Positive for depression. Negative for hallucinations, memory loss, substance abuse and suicidal ideas. The patient is nervous/anxious and has insomnia.      Psychiatric and Social History  Psychiatric History:  Information collected from patient, wife, chart review  Prev Dx/Sx: none Current Psych Provider: none Home Meds (current): none Previous Med Trials: none Therapy: marriage counseling with wife  Prior Psych Hospitalization: never Prior Self Harm: never Prior Violence: never  Family Psych History: Yes. Pt's father has history of bipolar disorder or schizophrenia. Mother was healthy (but is deceased) Family Hx suicide: denies  Social History:  Developmental Hx: raised in [place] , primarily by grandmother. Father in and out of his life when father not institutionalized in prison or mental hospital Educational Hx: graduated high school Occupational Hx: production designer, theatre/television/film at [employer] Legal Hx: none Living Situation:lives with wife Spiritual Hx: christian, requested chaplain services Access to weapons/lethal means:  none  Substance History Alcohol: occasional, <1 week  Type of alcohol: beer Tobacco: yes, frequent smoker, interested in cessation Illicit drugs: infrequent use of CBD oil Prescription drug abuse: denies Rehab hx: denies  Exam Findings  Physical Exam:  Vital Signs:  Temp:  [98 F (36.7 C)-98.9 F (37.2 C)] 98 F (36.7 C) (01/29 0808) Pulse Rate:  [69-90] 90 (01/29  0808) Resp:  [16-18] 18 (01/29 0808) BP: (115-130)/(58-87) 122/80 (01/29 0808) SpO2:  [91 %-100 %] 100 % (01/29 0808) Weight:  [147.6 kg] 147.6 kg (01/29 0500) Blood pressure 122/80, pulse 90, temperature 98 F (36.7 C), temperature source Oral, resp. rate 18, height 6' 3 (1.905 m), weight (!) 147.6 kg, SpO2 100%. Body mass index is 40.67 kg/m.  Physical Exam Vitals and nursing note reviewed.  Musculoskeletal:        General: Signs of injury present.  Skin:    General: Skin is warm and dry.  Neurological:     Mental Status: He is alert and oriented to person, place, and time.  Psychiatric:        Attention and Perception: Attention and perception normal.        Mood and Affect: Affect normal. Mood is anxious and depressed.        Speech: Speech normal.        Behavior: Behavior normal. Behavior is cooperative.        Thought Content: Thought content is not paranoid or delusional. Thought content does not include homicidal or suicidal ideation.        Cognition and Memory: Cognition and memory normal.        Judgment: Judgment normal.     Mental Status Exam: General Appearance: Casual  Orientation:  Full (Time, Place, and Person)  Memory:  Good  Concentration: Normal  Recall: Within normal limits  Attention  Good  Eye Contact:  Good  Speech:  Clear and Coherent and Normal Rate  Language:  Good  Volume:  Increased  Mood:  I am a little better, but still pretty anxious  Affect: Congruent, anxious, depressed  Thought Process:  Coherent and Linear  Thought Content:  WDL  Suicidal Thoughts:  No  Homicidal Thoughts:  No  Judgement:  Good  Insight:  Good  Psychomotor Activity:  Normal  Akathisia:  No  Fund of Knowledge:  Good      Assets:  Communication Skills Desire for Improvement Financial Resources/Insurance Housing Intimacy Leisure Time Resilience Social Support Vocational/Educational  Cognition:  WNL  ADL's:  Intact  AIMS (if indicated):        Other  History   These have been pulled in through the EMR, reviewed, and updated if appropriate.  Family History:  The patient's family history is not on file.  Medical History: Past Medical History:  Diagnosis Date   Medical history non-contributory     Surgical History: Past Surgical History:  Procedure Laterality Date   EXCISION OF TONGUE LESION N/A 10/23/2019   Procedure: RESECTION OF THYROGLOSSAL REMNANT IN BASE OF TONGUE;  Surgeon: Jesus Oliphant, MD;  Location: [facility] OR;  Service: ENT;  Laterality: N/A;   LUMBAR LAMINECTOMY/DECOMPRESSION MICRODISCECTOMY N/A 09/24/2024   Procedure: LUMBAR LAMINECTOMY/DECOMPRESSION MICRODISCECTOMY 1 LEVEL;  Surgeon: Jep Alm Hamilton, MD;  Location: [facility] OR;  Service: Neurosurgery;  Laterality: N/A;  Lumbar Three - Four, Lumbar Four - Five Laminectomy   NO PAST SURGERIES       Medications:  Current Medications[1]  Allergies: Allergies[2]  Signed: JINNY Morene GORMAN Delsie, MD  [place] Physician, PGY-2 09/28/2024 8:28 AM   Note added the following morning     [1]  Current Facility-Administered Medications:    acetaminophen  (TYLENOL ) tablet 650 mg, 650 mg, Oral, Q6H PRN, Rashid, Farhan, MD, 650 mg at 09/27/24 1514   alum & mag hydroxide-simeth (MAALOX/MYLANTA) 200-200-20 MG/5ML suspension 30 mL, 30 mL, Oral, Q4H PRN, Rashid, Farhan, MD, 30 mL at 09/27/24 1234   bisacodyl  (DULCOLAX) EC tablet 5 mg, 5 mg, Oral, Daily PRN, Cloud Alm Hamilton, MD, 5 mg at 09/25/24 2052   cefTRIAXone  (ROCEPHIN ) 2 g in sodium chloride  0.9 % 100 mL IVPB, 2 g, Intravenous, Q24H, Manandhar, Sabina, MD, Last Rate: 200 mL/hr at 09/27/24 1500, 2 g at 09/27/24 1500   celecoxib  (CELEBREX ) capsule 200 mg, 200 mg, Oral, Q12H, Adonay Alm Hamilton, MD, 200 mg at 09/28/24 9191   cholecalciferol  (VITAMIN D3) 25 MCG (1000 UNIT) tablet 1,000 Units, 1,000 Units, Oral, Daily, Dino Antu, MD, 1,000 Units at 09/28/24 0808   DULoxetine  (CYMBALTA ) DR capsule 30 mg, 30 mg, Oral, Daily, 30 mg at  09/28/24 0808 **FOLLOWED BY** [START ON 10/01/2024] DULoxetine  (CYMBALTA ) DR capsule 60 mg, 60 mg, Oral, Daily, Monchel Pollitt, Lynwood Morene Deems, MD   enoxaparin  (LOVENOX ) injection 40 mg, 40 mg, Subcutaneous, Q24H, Johnanna Credit Caylin, PA-C, 40 mg at 09/28/24 9192   famotidine  (PEPCID ) tablet 20 mg, 20 mg, Oral, Daily, Rashid, Farhan, MD, 20 mg at 09/28/24 9192   hydrALAZINE  (APRESOLINE ) injection 10 mg, 10 mg, Intravenous, Q4H PRN, Ibrahem Alm Hamilton, MD   HYDROmorphone  (DILAUDID ) injection 0.5-1 mg, 0.5-1 mg, Intravenous, Q2H PRN, Maurion Alm Hamilton, MD, 1 mg at 09/28/24 0030   ipratropium (ATROVENT ) nebulizer solution 0.5 mg, 0.5 mg, Nebulization, Q6H PRN, Harsha Alm Hamilton, MD   menthol  (CEPACOL) lozenge 3 mg, 1 lozenge, Oral, PRN **OR** phenol (CHLORASEPTIC) mouth spray 1 spray, 1 spray, Mouth/Throat, PRN, Carsin Alm Hamilton, MD   methocarbamol  (ROBAXIN ) tablet 500 mg, 500 mg, Oral, Q6H PRN, Ayson Alm Hamilton, MD, 500 mg at 09/28/24 0416   nicotine  (NICODERM CQ  - dosed in mg/24 hours) patch 14 mg, 14 mg, Transdermal, Daily, Kaison Alm Hamilton, MD, 14 mg at 09/28/24 9189   ondansetron  (ZOFRAN ) tablet 4 mg, 4 mg, Oral, Q6H PRN, 4 mg at 09/27/24 1454 **OR** ondansetron  (ZOFRAN ) injection 4 mg, 4 mg, Intravenous, Q6H PRN, Glade Alm Hamilton, MD, 4 mg at 09/26/24 2046   oxyCODONE  (Oxy IR/ROXICODONE ) immediate release tablet 5 mg, 5 mg, Oral, Q3H PRN, Lamarco Alm Hamilton, MD, 5 mg at 09/28/24 9191   oxyCODONE  (OXYCONTIN ) 12 hr tablet 10 mg, 10 mg, Oral, Q12H, Rashid, Farhan, MD, 10 mg at 09/28/24 9192   senna-docusate (Senokot-S) tablet 1 tablet, 1 tablet, Oral, BID, Hudsen Alm Hamilton, MD, 1 tablet at 09/28/24 9191   sodium chloride  flush (NS) 0.9 % injection 3 mL, 3 mL, Intravenous, Q12H, Blythe Alm Hamilton, MD, 3 mL at 09/28/24 9191   sodium chloride  flush (NS) 0.9 % injection 3 mL, 3 mL, Intravenous, Q12H, Khadeem Alm Hamilton, MD, 3 mL at 09/27/24 2208   sodium chloride  flush (NS) 0.9 % injection  3 mL, 3 mL, Intravenous, Q12H, Jeronimo Alm Hamilton, MD, 3 mL at 09/27/24 2208   sodium chloride  flush (NS) 0.9 % injection 3 mL, 3 mL, Intravenous, PRN, Izaiyah Alm Hamilton, MD   traZODone  (DESYREL ) tablet 100 mg, 100 mg, Oral, QHS, Delsie Lynwood Morene Deems, MD, 100 mg at 09/27/24 2204 [2] No Known Allergies

## 2024-09-28 NOTE — Progress Notes (Signed)
" °  °  Providing Compassionate, Quality Care - Together   NEUROSURGERY PROGRESS NOTE     S: No issues overnight.    O: EXAM:  BP 122/80 (BP Location: Left Arm)   Pulse 90   Temp 98 F (36.7 C) (Oral)   Resp 18   Ht 6' 3 (1.905 m)   Wt (!) 147.6 kg Comment: bed weight  SpO2 100%   BMI 40.67 kg/m     Awake, alert, oriented  Speech fluent, appropriate  BUE/BLE 5/5, 4+/5 DF LLE. SILTx4, subjectively decreased LLE    ASSESSMENT:  39 y.o. s/p lami for lumbar discitis/EDA. Outpatient blood cultures positive for GPCs.       PLAN: -Continue abx -Continue therapies as tolerated -Anticipate dc to CIR tmrw.  -Call w/ questions/concerns.   Camie Pickle, PAC  "

## 2024-09-29 ENCOUNTER — Encounter (HOSPITAL_COMMUNITY): Payer: Self-pay | Admitting: Physical Medicine and Rehabilitation

## 2024-09-29 ENCOUNTER — Inpatient Hospital Stay (HOSPITAL_COMMUNITY)
Admission: AD | Admit: 2024-09-29 | DRG: 949 | Disposition: A | Source: Intra-hospital | Attending: Physical Medicine and Rehabilitation | Admitting: Physical Medicine and Rehabilitation

## 2024-09-29 ENCOUNTER — Other Ambulatory Visit: Payer: Self-pay

## 2024-09-29 DIAGNOSIS — M4627 Osteomyelitis of vertebra, lumbosacral region: Secondary | ICD-10-CM | POA: Diagnosis not present

## 2024-09-29 DIAGNOSIS — M4640 Discitis, unspecified, site unspecified: Principal | ICD-10-CM | POA: Diagnosis present

## 2024-09-29 DIAGNOSIS — G8918 Other acute postprocedural pain: Secondary | ICD-10-CM | POA: Diagnosis not present

## 2024-09-29 DIAGNOSIS — B958 Unspecified staphylococcus as the cause of diseases classified elsewhere: Secondary | ICD-10-CM

## 2024-09-29 DIAGNOSIS — M869 Osteomyelitis, unspecified: Secondary | ICD-10-CM | POA: Diagnosis not present

## 2024-09-29 DIAGNOSIS — K5901 Slow transit constipation: Secondary | ICD-10-CM

## 2024-09-29 DIAGNOSIS — M4626 Osteomyelitis of vertebra, lumbar region: Secondary | ICD-10-CM | POA: Diagnosis present

## 2024-09-29 DIAGNOSIS — F4323 Adjustment disorder with mixed anxiety and depressed mood: Secondary | ICD-10-CM

## 2024-09-29 DIAGNOSIS — K6812 Psoas muscle abscess: Secondary | ICD-10-CM | POA: Diagnosis not present

## 2024-09-29 DIAGNOSIS — R7881 Bacteremia: Secondary | ICD-10-CM

## 2024-09-29 DIAGNOSIS — B955 Unspecified streptococcus as the cause of diseases classified elsewhere: Secondary | ICD-10-CM | POA: Diagnosis not present

## 2024-09-29 DIAGNOSIS — M48061 Spinal stenosis, lumbar region without neurogenic claudication: Secondary | ICD-10-CM | POA: Diagnosis not present

## 2024-09-29 DIAGNOSIS — G834 Cauda equina syndrome: Secondary | ICD-10-CM | POA: Diagnosis present

## 2024-09-29 DIAGNOSIS — M4647 Discitis, unspecified, lumbosacral region: Secondary | ICD-10-CM | POA: Diagnosis not present

## 2024-09-29 DIAGNOSIS — G061 Intraspinal abscess and granuloma: Secondary | ICD-10-CM

## 2024-09-29 DIAGNOSIS — R6889 Other general symptoms and signs: Secondary | ICD-10-CM

## 2024-09-29 LAB — CBC
HCT: 37.4 % — ABNORMAL LOW (ref 39.0–52.0)
Hemoglobin: 12.4 g/dL — ABNORMAL LOW (ref 13.0–17.0)
MCH: 27.8 pg (ref 26.0–34.0)
MCHC: 33.2 g/dL (ref 30.0–36.0)
MCV: 83.9 fL (ref 80.0–100.0)
Platelets: 636 10*3/uL — ABNORMAL HIGH (ref 150–400)
RBC: 4.46 MIL/uL (ref 4.22–5.81)
RDW: 13.6 % (ref 11.5–15.5)
WBC: 9.9 10*3/uL (ref 4.0–10.5)
nRBC: 0 % (ref 0.0–0.2)

## 2024-09-29 LAB — AEROBIC/ANAEROBIC CULTURE W GRAM STAIN (SURGICAL/DEEP WOUND): Gram Stain: NONE SEEN

## 2024-09-29 LAB — CREATININE, SERUM
Creatinine, Ser: 0.84 mg/dL (ref 0.61–1.24)
GFR, Estimated: >60 mL/min

## 2024-09-29 LAB — GLUCOSE, CAPILLARY: Glucose-Capillary: 102 mg/dL — ABNORMAL HIGH (ref 70–99)

## 2024-09-29 MED ORDER — DULOXETINE HCL 30 MG PO CPEP: ORAL_CAPSULE | ORAL | Status: AC

## 2024-09-29 MED ORDER — TIZANIDINE HCL 2 MG PO TABS
2.0000 mg | ORAL_TABLET | Freq: Three times a day (TID) | ORAL | Status: DC
  Administered 2024-09-29 – 2024-10-02 (×9): 2 mg via ORAL
  Filled 2024-09-29 (×9): qty 1

## 2024-09-29 MED ORDER — BISACODYL 5 MG PO TBEC: 5.0000 mg | DELAYED_RELEASE_TABLET | Freq: Every day | ORAL | Status: AC | PRN

## 2024-09-29 MED ORDER — IPRATROPIUM BROMIDE 0.02 % IN SOLN: 0.5000 mg | Freq: Four times a day (QID) | RESPIRATORY_TRACT | Status: AC | PRN

## 2024-09-29 MED ORDER — HYDROMORPHONE HCL 1 MG/ML IJ SOLN
0.5000 mg | INTRAMUSCULAR | Status: DC | PRN
  Administered 2024-09-29 – 2024-09-30 (×3): 1 mg via INTRAVENOUS
  Filled 2024-09-29 (×3): qty 1

## 2024-09-29 MED ORDER — TRAZODONE HCL 100 MG PO TABS: 100.0000 mg | ORAL_TABLET | Freq: Every day | ORAL | Status: AC

## 2024-09-29 MED ORDER — VITAMIN D 25 MCG (1000 UNIT) PO TABS
1000.0000 [IU] | ORAL_TABLET | Freq: Every day | ORAL | Status: AC
  Administered 2024-09-29 – 2024-10-06 (×8): 1000 [IU] via ORAL
  Filled 2024-09-29 (×8): qty 1

## 2024-09-29 MED ORDER — VITAMIN D3 25 MCG PO TABS: 1000.0000 [IU] | ORAL_TABLET | Freq: Every day | ORAL | Status: AC

## 2024-09-29 MED ORDER — OXYCODONE HCL ER 10 MG PO T12A
20.0000 mg | EXTENDED_RELEASE_TABLET | Freq: Two times a day (BID) | ORAL | Status: DC
  Administered 2024-09-29 – 2024-10-03 (×8): 20 mg via ORAL
  Filled 2024-09-29 (×8): qty 2

## 2024-09-29 MED ORDER — OXYCODONE HCL 5 MG PO TABS
5.0000 mg | ORAL_TABLET | ORAL | Status: DC | PRN
  Administered 2024-09-29 – 2024-09-30 (×4): 5 mg via ORAL
  Filled 2024-09-29 (×4): qty 1

## 2024-09-29 MED ORDER — ORAL CARE MOUTH RINSE: 15.0000 mL | OROMUCOSAL | Status: AC | PRN

## 2024-09-29 MED ORDER — CELECOXIB 200 MG PO CAPS: 200.0000 mg | ORAL_CAPSULE | Freq: Two times a day (BID) | ORAL | Status: AC

## 2024-09-29 MED ORDER — DULOXETINE HCL 30 MG PO CPEP
30.0000 mg | ORAL_CAPSULE | Freq: Every day | ORAL | Status: AC
  Administered 2024-09-30 – 2024-10-01 (×2): 30 mg via ORAL
  Filled 2024-09-29 (×2): qty 1

## 2024-09-29 MED ORDER — NICOTINE 14 MG/24HR TD PT24: 14.0000 mg | MEDICATED_PATCH | Freq: Every day | TRANSDERMAL | Status: AC

## 2024-09-29 MED ORDER — DULOXETINE HCL 30 MG PO CPEP
60.0000 mg | ORAL_CAPSULE | Freq: Every day | ORAL | Status: AC
  Administered 2024-10-02 – 2024-10-06 (×5): 60 mg via ORAL
  Filled 2024-09-29 (×5): qty 2

## 2024-09-29 MED ORDER — ONDANSETRON HCL 4 MG/2ML IJ SOLN: 4.0000 mg | Freq: Four times a day (QID) | INTRAMUSCULAR | Status: AC | PRN

## 2024-09-29 MED ORDER — CEFTRIAXONE IV (FOR PTA / DISCHARGE USE ONLY): 2.0000 g | INTRAVENOUS | 0 refills | Status: AC

## 2024-09-29 MED ORDER — SODIUM CHLORIDE 0.9% FLUSH: 10.0000 mL | INTRAVENOUS | Status: DC | PRN

## 2024-09-29 MED ORDER — SODIUM CHLORIDE 0.9% FLUSH: 10.0000 mL | INTRAVENOUS | Status: AC | PRN

## 2024-09-29 MED ORDER — SODIUM CHLORIDE 0.9% FLUSH: 10.0000 mL | Freq: Two times a day (BID) | INTRAVENOUS | Status: DC

## 2024-09-29 MED ORDER — CELECOXIB 200 MG PO CAPS
200.0000 mg | ORAL_CAPSULE | Freq: Two times a day (BID) | ORAL | Status: AC
  Administered 2024-09-29 – 2024-10-06 (×15): 200 mg via ORAL
  Filled 2024-09-29 (×15): qty 1

## 2024-09-29 MED ORDER — ONDANSETRON HCL 4 MG PO TABS: 4.0000 mg | ORAL_TABLET | Freq: Four times a day (QID) | ORAL | Status: AC | PRN

## 2024-09-29 MED ORDER — ACETAMINOPHEN 325 MG PO TABS
650.0000 mg | ORAL_TABLET | Freq: Four times a day (QID) | ORAL | Status: AC | PRN
  Administered 2024-10-05: 650 mg via ORAL
  Filled 2024-09-29 (×3): qty 2

## 2024-09-29 MED ORDER — OXYCODONE HCL 5 MG PO TABS: 5.0000 mg | ORAL_TABLET | ORAL | Status: AC | PRN

## 2024-09-29 MED ORDER — METHOCARBAMOL 750 MG PO TABS
750.0000 mg | ORAL_TABLET | Freq: Four times a day (QID) | ORAL | Status: DC
  Administered 2024-09-29 (×2): 750 mg via ORAL
  Filled 2024-09-29 (×2): qty 1

## 2024-09-29 MED ORDER — CYCLOBENZAPRINE HCL 5 MG PO TABS
5.0000 mg | ORAL_TABLET | Freq: Once | ORAL | Status: AC
  Administered 2024-09-29: 5 mg via ORAL
  Filled 2024-09-29: qty 1

## 2024-09-29 MED ORDER — CHLORHEXIDINE GLUCONATE CLOTH 2 % EX PADS
6.0000 | MEDICATED_PAD | Freq: Every day | CUTANEOUS | Status: DC
  Administered 2024-09-29 – 2024-09-30 (×2): 6 via TOPICAL

## 2024-09-29 MED ORDER — ENOXAPARIN SODIUM 80 MG/0.8ML IJ SOSY
70.0000 mg | PREFILLED_SYRINGE | INTRAMUSCULAR | Status: AC
  Administered 2024-09-30 – 2024-10-06 (×7): 70 mg via SUBCUTANEOUS
  Filled 2024-09-29 (×7): qty 0.7
  Filled 2024-09-29: qty 0.8
  Filled 2024-09-29: qty 0.7

## 2024-09-29 MED ORDER — FAMOTIDINE 20 MG PO TABS: 20.0000 mg | ORAL_TABLET | Freq: Every day | ORAL | Status: AC

## 2024-09-29 MED ORDER — ALUM & MAG HYDROXIDE-SIMETH 200-200-20 MG/5ML PO SUSP: 30.0000 mL | ORAL | Status: AC | PRN

## 2024-09-29 MED ORDER — FAMOTIDINE 20 MG PO TABS
20.0000 mg | ORAL_TABLET | Freq: Every day | ORAL | Status: AC
  Administered 2024-09-30 – 2024-10-06 (×7): 20 mg via ORAL
  Filled 2024-09-29 (×7): qty 1

## 2024-09-29 MED ORDER — CHLORHEXIDINE GLUCONATE CLOTH 2 % EX PADS: 6.0000 | MEDICATED_PAD | Freq: Every day | CUTANEOUS | Status: DC

## 2024-09-29 MED ORDER — SENNOSIDES-DOCUSATE SODIUM 8.6-50 MG PO TABS: 1.0000 | ORAL_TABLET | Freq: Two times a day (BID) | ORAL | Status: AC

## 2024-09-29 MED ORDER — SENNOSIDES-DOCUSATE SODIUM 8.6-50 MG PO TABS
1.0000 | ORAL_TABLET | Freq: Two times a day (BID) | ORAL | Status: AC
  Administered 2024-09-29 – 2024-10-06 (×13): 1 via ORAL
  Filled 2024-09-29 (×15): qty 1

## 2024-09-29 MED ORDER — POLYETHYLENE GLYCOL 3350 17 G PO PACK
17.0000 g | PACK | Freq: Every day | ORAL | Status: AC
  Administered 2024-09-29 – 2024-10-06 (×8): 17 g via ORAL
  Filled 2024-09-29 (×8): qty 1

## 2024-09-29 MED ORDER — NICOTINE 14 MG/24HR TD PT24
14.0000 mg | MEDICATED_PATCH | Freq: Every day | TRANSDERMAL | Status: AC
  Administered 2024-09-30 – 2024-10-06 (×7): 14 mg via TRANSDERMAL
  Filled 2024-09-29 (×7): qty 1

## 2024-09-29 MED ORDER — SODIUM CHLORIDE 0.9 % IV SOLN
2.0000 g | INTRAVENOUS | Status: AC
  Administered 2024-09-29 – 2024-10-06 (×8): 2 g via INTRAVENOUS
  Filled 2024-09-29 (×9): qty 20

## 2024-09-29 MED ORDER — METHOCARBAMOL 750 MG PO TABS: 750.0000 mg | ORAL_TABLET | Freq: Four times a day (QID) | ORAL | Status: AC

## 2024-09-29 MED ORDER — TRAZODONE HCL 50 MG PO TABS
100.0000 mg | ORAL_TABLET | Freq: Every day | ORAL | Status: AC
  Administered 2024-09-29 – 2024-10-06 (×8): 100 mg via ORAL
  Filled 2024-09-29 (×8): qty 2

## 2024-09-29 MED ORDER — ENOXAPARIN SODIUM 40 MG/0.4ML IJ SOSY: 70.0000 mg | PREFILLED_SYRINGE | INTRAMUSCULAR | Status: AC

## 2024-09-29 MED ORDER — OXYCODONE HCL ER 15 MG PO T12A: 15.0000 mg | EXTENDED_RELEASE_TABLET | Freq: Two times a day (BID) | ORAL | Status: AC

## 2024-09-29 MED ORDER — METHOCARBAMOL 750 MG PO TABS
750.0000 mg | ORAL_TABLET | Freq: Four times a day (QID) | ORAL | Status: DC | PRN
  Administered 2024-09-30 – 2024-10-03 (×6): 750 mg via ORAL
  Filled 2024-09-29 (×6): qty 1

## 2024-09-29 NOTE — Progress Notes (Signed)
 Peripherally Inserted Central Catheter Placement  The IV Nurse has discussed with the patient and/or persons authorized to consent for the patient, the purpose of this procedure and the potential benefits and risks involved with this procedure.  The benefits include less needle sticks, lab draws from the catheter, and the patient may be discharged home with the catheter. Risks include, but not limited to, infection, bleeding, blood clot (thrombus formation), and puncture of an artery; nerve damage and irregular heartbeat and possibility to perform a PICC exchange if needed/ordered by physician.  Alternatives to this procedure were also discussed.  Bard Power PICC patient education guide, fact sheet on infection prevention and patient information card has been provided to patient /or left at bedside.    PICC Placement Documentation  PICC Single Lumen 09/29/24 Right Basilic 46 cm 1 cm (Active)  Indication for Insertion or Continuance of Line Prolonged intravenous therapies 09/29/24 1201  Exposed Catheter (cm) 1 cm 09/29/24 1201  Site Assessment Clean, Dry, Intact 09/29/24 1201  Line Status Flushed;Blood return noted;Saline locked 09/29/24 1201  Dressing Type Transparent 09/29/24 1201  Dressing Status Antimicrobial disc/dressing in place 09/29/24 1201  Line Care Connections checked and tightened 09/29/24 1201  Line Adjustment (NICU/IV Team Only) No 09/29/24 1201  Dressing Intervention New dressing 09/29/24 1201  Dressing Change Due 10/06/24 09/29/24 1201       Aron Singh Ramos 09/29/2024, 12:04 PM

## 2024-09-29 NOTE — Progress Notes (Signed)
" ° °  Inpatient Rehabilitation Admissions Coordinator   We have CIR bed to admit him to today. He can be admitted with his Air mattress to CIR. Acute team and TOC made aware.  Heron Leavell, RN, MSN Rehab Admissions Coordinator [phone] 09/29/2024 11:13 AM  "

## 2024-09-29 NOTE — Progress Notes (Addendum)
 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 Follow up on final susceptibilities on Strep intermedius for future potential antibiotic de-escalation  IV antibiotic discharge orders are pended. To discharging provider:  please sign these orders via discharge navigator,  Select New Orders & click on the button choice - Manage This Unsigned Work.    Thank you for allowing pharmacy to be a part of this patient's care.  Feliciano Close, PharmD PGY2 Infectious Diseases Pharmacy Resident

## 2024-09-29 NOTE — Progress Notes (Signed)
" °  °  Providing Compassionate, Quality Care - Together   NEUROSURGERY PROGRESS NOTE     S: PICC being inserted at this time, unable to see pt.    O: EXAM:  BP 133/82 (BP Location: Right Arm)   Pulse 70   Temp 98.2 F (36.8 C) (Oral)   Resp 16   Ht 6' 3 (1.905 m)   Wt (!) 147.5 kg   SpO2 96%   BMI 40.64 kg/m     ASSESSMENT:  39 y.o. s/p lami for lumbar discitis/EDA.     PLAN: -Transferring to CIR today. -Continue abx -Outpatient follow up upon dc from CIR.    Camie Pickle, PAC  "

## 2024-09-29 NOTE — H&P (Signed)
 "   Physical Medicine and Rehabilitation Admission H&P    Chief Complaint  Patient presents with   Back Pain  : HPI: Alexander Roberts is a 39 year old right-handed male with unremarkable past medical history except chronic back pain with epidural injection 2 years ago and tobacco use as well as class III obesity BMI 40.64.  Per chart review patient lives with spouse and 32 year old son.  1 level home 4 steps to entry.  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  Admitted 09/24/2024 after two recent ED visits with increasing back pain over the 2 weeks with weakness of bilateral lower extremity as well as reported numbness in his perineum and bilateral lower extremities.  Denied any urinary or fecal incontinence.  MRI initially was done 09/20/2024 as an outpatient without contrast suggestive of discitis and osteomyelitis.  Admission chemistries unremarkable except blood culture Streptococcus intermedius, sedimentation rate 55, C-reactive protein 40.9, glucose 110, chloride 96, WBC 13,500.  Patient was initially placed on broad-spectrum antibiotics.  Neurosurgery Dr. Alm Molt as well as infectious disease Dr. Dennise consulted.  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  Findings were consistent with discitis and osteomyelitis L4-5 and L5-S1 with additional concern for discitis at L3-L4.  Possible osteomyelitis in the posterior L3 vertebral body.  Paraspinal and psoas muscle edema with multiple collections concerning for abscesses, including a partially visualized right paraspinal collection at L4-5 and left paraspinal/psoas collection at L5-S1.  Severe spinal canal stenosis at L2-3, L3-4 and L4-5 with severe right and moderate to severe left foraminal stenosis at  L5-S1.  Patient underwent decompressive lumbar laminectomy, medial facetectomy foraminotomies L3-4 and L4-5 with evacuation of epidural abscess 09/24/2024 per Dr. Alm Molt.NO BRACE NEEDED.  Hospital course follow-up ID for Streptococcus intermedius bacteremia with latest blood cultures 1/27 showing no growth to date.  1/29 TTE with no vegetations.  CT abdomen pelvis with contrast to rule out any intra-abdominal abscess due to high association with pyogenic infection with Streptococcus intermedius showed vague fat stranding along a focal distal sigmoid diverticulum representing resolving or developing acute diverticulitis however doubtful and continue to monitor.  No focal soft tissue abnormality.  Severe degenerative changes at the L4-L5 level.  No acute osseous abnormality.  Patient currently remains on IV ceftriaxone  awaiting full sensitivities follow-up per ID.  Patient was cleared to begin Lovenox  for DVT prophylaxis 09/28/2024.  Psychiatry services consulted 09/26/2024 for adjustment disorder patient with noted increased depression and tearfulness and was started on duloxetine  titrated as needed as well as trazodone  and psychiatry services signed off 09/28/2024.  Therapy evaluations completed due to patient decreased functional mobility was admitted for a comprehensive rehab program.  Review of Systems  Constitutional:  Negative for chills and fever.  HENT:  Negative for hearing loss.   Eyes:  Negative for blurred vision and double vision.  Respiratory:  Negative for cough, shortness of breath and wheezing.   Cardiovascular:  Negative for chest pain, palpitations and leg swelling.  Gastrointestinal:  Positive for constipation. Negative for heartburn, nausea and vomiting.  Genitourinary:  Negative for dysuria, flank pain and hematuria.  Musculoskeletal:  Positive for back pain, joint pain and myalgias.  Skin:  Negative for rash.  Neurological:  Positive for sensory change.       Intermittent bouts of  headache  Psychiatric/Behavioral:  The patient has insomnia.   All other systems reviewed and are negative.  Past Medical History:  Diagnosis Date   Medical history non-contributory    Past Surgical History:  Procedure Laterality Date   EXCISION OF TONGUE LESION N/A 10/23/2019   Procedure: RESECTION OF THYROGLOSSAL REMNANT IN BASE OF TONGUE;  Surgeon: Jesus Oliphant, MD;  Location: [facility] OR;  Service: ENT;  Laterality: N/A;   LUMBAR LAMINECTOMY/DECOMPRESSION MICRODISCECTOMY N/A 09/24/2024   Procedure: LUMBAR LAMINECTOMY/DECOMPRESSION MICRODISCECTOMY 1 LEVEL;  Surgeon: Dontre Alm Hamilton, MD;  Location: [facility] OR;  Service: Neurosurgery;  Laterality: N/A;  Lumbar Three - Four, Lumbar Four - Five Laminectomy   NO PAST SURGERIES     History reviewed. No pertinent family history. Social History:  reports that he has been smoking cigarettes. He has never used smokeless tobacco. He reports current alcohol use. He reports that he does not use drugs. Allergies: Allergies[1] Medications Prior to Admission  Medication Sig Dispense Refill   acetaminophen  (TYLENOL ) 500 MG tablet Take 1,000-1,500 mg by mouth every 6 (six) hours as needed for moderate pain (pain score 4-6) or mild pain (pain score 1-3).     celecoxib  (CELEBREX ) 200 MG capsule Take 1 capsule (200 mg total) by mouth 2 (two) times daily. 20 capsule 0   Ibuprofen -Acetaminophen  (ADVIL  DUAL ACTION) 125-250 MG TABS Take 3 tablets by mouth every 6 (six) hours as needed (pain).     Ibuprofen -diphenhydrAMINE Cit (ADVIL  PM) 200-38 MG TABS Take 4 tablets by mouth at bedtime as needed (pain).     methocarbamol  (ROBAXIN ) 750 MG tablet Take 750 mg by mouth 3 (three) times daily.     oxyCODONE -acetaminophen  (PERCOCET) 10-325 MG tablet Take 1-2 tablets by mouth every 6 (six) hours as needed.     pregabalin (LYRICA) 75 MG capsule Take 75 mg by mouth 2 (two) times daily.     lidocaine  (LIDODERM ) 5 % Place 1 patch onto the skin daily. Remove & Discard patch within  12 hours or as directed by MD (Patient not taking: Reported on 09/24/2024) 30 patch 0   promethazine  (PHENERGAN ) 25 MG suppository Place 1 suppository (25 mg total) rectally every 6 (six) hours as needed for nausea or vomiting. 12 suppository 1      Home: Home Living Family/patient expects to be discharged to:: Private residence Living Arrangements: Spouse/significant other, Children Available Help at Discharge: Family, Available 24 hours/day (wife will take FMLA) Type of Home: House Home Access: Stairs to enter [employer] of Steps: 4 Entrance Stairs-Rails: Left Home Layout: One level Bathroom Shower/Tub: Engineer, Manufacturing Systems: Standard Bathroom Accessibility: Yes Home Equipment: Agricultural Consultant (2 wheels)  Lives With: Spouse   Functional History: Prior Function Prior Level of Function : Independent/Modified Independent, Working/employed, Driving Mobility Comments: 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 MinA for LLE negotiation back into bed Transfers Overall transfer level: Needs assistance Equipment used: Rolling walker (2 wheels) Transfers: Sit to/from Stand Sit to Stand: Contact guard assist Bed to/from chair/wheelchair/BSC transfer type:: Step pivot Step pivot transfers: Contact guard assist  General transfer comment: Use of momentum to power up Ambulation/Gait Ambulation/Gait assistance: Contact guard assist Gait Distance (Feet): 75 Feet Assistive device: Rolling walker (2  wheels) Gait Pattern/deviations: Step-to pattern, Decreased dorsiflexion - left, Decreased stance time - left General Gait Details: Pt self cueing for smaller steps for increased control, tactile cues provided to decrease L knee hyperextension. Heavy use of UE's on RW. Gait velocity: decreased Gait velocity interpretation: <1.8 ft/sec, indicate of risk for recurrent falls    ADL: ADL Overall ADL's : Needs assistance/impaired Eating/Feeding: Independent Grooming: Set up, Sitting Upper Body Bathing: Minimal assistance, Sitting Lower Body Bathing: Contact guard assist, With adaptive equipment, Adhering to back precautions Lower Body Bathing Details (indicate cue type and reason): CGA for return demonstration of long handled sponge use Upper Body Dressing : Minimal assistance, Sitting Upper Body Dressing Details (indicate cue type and reason): Min A from spouse Lower Body Dressing: Minimal assistance, With adaptive equipment, Moderate assistance Lower Body Dressing Details (indicate cue type and reason): Min A using reacher and sock aide while returning demonstration for donning/doffing socks. Increased assist needed for pants Toilet Transfer: Moderate assistance, Stand-pivot, BSC/3in1, Rolling walker (2 wheels) Toileting- Clothing Manipulation and Hygiene: Maximal assistance, Sitting/lateral lean, Sit to/from stand  Cognition: Cognition Orientation Level: Oriented X4 Cognition Arousal: Alert Behavior During Therapy: WFL for tasks assessed/performed  Physical Exam: Blood pressure (!) 148/79, pulse 82, temperature 98.3 F (36.8 C), temperature source Oral, resp. rate 17, height 6' 3 (1.905 m), weight (!) 147.6 kg, SpO2 96%. Physical Exam Constitutional:      General: He is in acute distress.  HENT:     Head: Normocephalic and atraumatic.     Right Ear: External ear normal.     Left Ear: External ear normal.     Nose: Nose normal.     Mouth/Throat:     Mouth: Mucous membranes are  moist.     Pharynx: Oropharynx is clear.  Eyes:     Pupils: Pupils are equal, round, and reactive to light.  Cardiovascular:     Rate and Rhythm: Normal rate and regular rhythm.  Pulmonary:     Effort: Pulmonary effort is normal. No respiratory distress.     Breath sounds: No wheezing.  Abdominal:     General: Bowel sounds are normal. There is no distension.     Tenderness: There is no abdominal tenderness.  Musculoskeletal:        General: Tenderness (LB and mid back) present. No swelling. Normal range of motion.     Cervical back: Normal range of motion.  Skin:    General: Skin is warm.     Comments: Back incision CDI. Former drain site with foam dressing  Neurological:     Mental Status: He is alert.     Comments: Alert and oriented x 3. Normal insight and awareness. Intact Memory. Normal language and speech. Cranial nerve exam unremarkable. MMT: BUE 5/5. LLE 4-/5 HF and KE d/t pain, ADF 4+/5, APF 4+/5. RLE 4/5 HF, KE d/t pain. ADF/PF 5/5. Decreased LT/pain RLE along outer side of right lower leg as well as bottom of foot more so than top of foot. LLE with decreased LT and pain below the knee, worst in feet. DTR's 1+. No abnl resting tone.  Psychiatric:        Mood and Affect: Mood normal.        Behavior: Behavior normal.     Results for orders placed or performed during the hospital encounter of 09/24/24 (  from the past 48 hours)  Glucose, capillary     Status: Abnormal   Collection Time: 09/27/24  7:50 AM  Result Value Ref Range   Glucose-Capillary 110 (H) 70 - 99 mg/dL    Comment: Glucose reference range applies only to samples taken after fasting for at least 8 hours.   Comment 1 Notify RN   Glucose, capillary     Status: Abnormal   Collection Time: 09/28/24  7:49 AM  Result Value Ref Range   Glucose-Capillary 111 (H) 70 - 99 mg/dL    Comment: Glucose reference range applies only to samples taken after fasting for at least 8 hours.   CT ABDOMEN PELVIS W CONTRAST Result  Date: 09/28/2024 EXAM: CT ABDOMEN AND PELVIS WITH CONTRAST 09/28/2024 08:22:00 PM TECHNIQUE: CT of the abdomen and pelvis was performed with the administration of 75 mL of iohexol  (OMNIPAQUE ) 350 MG/ML injection. Multiplanar reformatted images are provided for review. Automated exposure control, iterative reconstruction, and/or weight-based adjustment of the mA/kV was utilized to reduce the radiation dose to as low as reasonably achievable. COMPARISON: None available. CLINICAL HISTORY: r/o intraabdominal abscess. Rule out intraabdominal abscess. FINDINGS: LOWER CHEST: No acute abnormality. LIVER: The liver is unremarkable. GALLBLADDER AND BILE DUCTS: Gallbladder is unremarkable. No biliary ductal dilatation. SPLEEN: No acute abnormality. PANCREAS: No acute abnormality. ADRENAL GLANDS: No acute abnormality. KIDNEYS, URETERS AND BLADDER: Fluid density lesion in the left kidney likely represents a simple cyst. Simple renal cysts do not require additional follow-up unless clinically indicated due to signs/symptoms. No stones in the kidneys or ureters. No hydronephrosis. No perinephric or periureteral stranding. Urinary bladder is unremarkable. GI AND BOWEL: Vague fat stranding along a focal distal sigmoid diverticulum (5.85, 2.61). No small or large bowel thickening or dilatation. Stool throughout the colon. Stomach demonstrates no acute abnormality. There is no bowel obstruction. APPENDIX: The appendix is unremarkable. PERITONEUM AND RETROPERITONEUM: No ascites. No free air. No organized fluid collection. VASCULATURE: Aorta is normal in caliber. LYMPH NODES: No lymphadenopathy. REPRODUCTIVE ORGANS: No acute abnormality. BONES AND SOFT TISSUES: Severe degenerative changes at the L4 to S1 level. No acute osseous abnormality. No focal soft tissue abnormality. IMPRESSION: 1. Vague fat stranding along a focal distal sigmoid diverticulum - finding may represent resolving or developing acute diverticulitis. 2. Stool  throughout the majority of the colon. Electronically signed by: Morgane Naveau MD 09/28/2024 10:32 PM EST RP Workstation: [id number]   ECHOCARDIOGRAM COMPLETE Result Date: 09/27/2024    ECHOCARDIOGRAM REPORT   Patient Name:   Alexander Roberts Date of Exam: 09/27/2024 Medical Rec #:  [id number]       Height:       75.0 in Accession #:    [id number]      Weight:       322.3 lb Date of Birth:  [birth year]       BSA:          2.688 m Patient Age:    38 years        BP:           149/94 mmHg Patient Gender: M               HR:           76 bpm. Exam Location:  Inpatient Procedure: 2D Echo, Cardiac Doppler and Color Doppler (Both Spectral and Color            Flow Doppler were utilized during procedure). Indications:    Bacteremia R78.81  History:  Patient has no prior history of Echocardiogram examinations.                 Signs/Symptoms:Hypertensive Heart Disease.  Sonographer:    Nathanel Devonshire Referring Phys: [id number] [facility] IMPRESSIONS  1. Left ventricular ejection fraction, by estimation, is 65 to 70%. The left ventricle has normal function. The left ventricle has no regional wall motion abnormalities. Left ventricular diastolic parameters were normal.  2. Right ventricular systolic function is normal. The right ventricular size is normal.  3. The mitral valve is normal in structure. Trivial mitral valve regurgitation. No evidence of mitral stenosis.  4. The aortic valve is normal in structure. Aortic valve regurgitation is not visualized. No aortic stenosis is present.  5. The inferior vena cava is normal in size with greater than 50% respiratory variability, suggesting right atrial pressure of 3 mmHg. Conclusion(s)/Recommendation(s): Technically limited study due to poor sound wave transmission. FINDINGS  Left Ventricle: Left ventricular ejection fraction, by estimation, is 65 to 70%. The left ventricle has normal function. The left ventricle has no regional wall motion abnormalities. The left  ventricular internal cavity size was normal in size. There is  no left ventricular hypertrophy. Left ventricular diastolic parameters were normal. Right Ventricle: The right ventricular size is normal. No increase in right ventricular wall thickness. Right ventricular systolic function is normal. Left Atrium: Left atrial size was normal in size. Right Atrium: Right atrial size was normal in size. Pericardium: There is no evidence of pericardial effusion. Mitral Valve: The mitral valve is normal in structure. Trivial mitral valve regurgitation. No evidence of mitral valve stenosis. Tricuspid Valve: The tricuspid valve is normal in structure. Tricuspid valve regurgitation is trivial. No evidence of tricuspid stenosis. Aortic Valve: The aortic valve is normal in structure. Aortic valve regurgitation is not visualized. No aortic stenosis is present. Aortic valve mean gradient measures 7.0 mmHg. Aortic valve peak gradient measures 16.2 mmHg. Aortic valve area, by VTI measures 2.56 cm. Pulmonic Valve: The pulmonic valve was not well visualized. Pulmonic valve regurgitation is not visualized. No evidence of pulmonic stenosis. Aorta: The aortic root is normal in size and structure. Venous: The inferior vena cava is normal in size with greater than 50% respiratory variability, suggesting right atrial pressure of 3 mmHg. IAS/Shunts: No atrial level shunt detected by color flow Doppler.  LEFT VENTRICLE PLAX 2D LVIDd:         4.20 cm      Diastology LVIDs:         2.90 cm      LV e' medial:    7.94 cm/s LV PW:         1.10 cm      LV E/e' medial:  9.3 LV IVS:        1.10 cm      LV e' lateral:   10.70 cm/s LVOT diam:     2.00 cm      LV E/e' lateral: 6.9 LV SV:         77 LV SV Index:   29 LVOT Area:     3.14 cm  LV Volumes (MOD) LV vol d, MOD A2C: 98.4 ml LV vol d, MOD A4C: 133.0 ml LV vol s, MOD A2C: 37.4 ml LV vol s, MOD A4C: 33.9 ml LV SV MOD A2C:     61.0 ml LV SV MOD A4C:     133.0 ml LV SV MOD BP:      80.9 ml RIGHT  VENTRICLE  IVC TAPSE (M-mode): 2.2 cm  IVC diam: 1.70 cm LEFT ATRIUM             Index LA diam:        3.30 cm 1.23 cm/m LA Vol (A2C):   47.3 ml 17.60 ml/m LA Vol (A4C):   27.9 ml 10.38 ml/m LA Biplane Vol: 37.2 ml 13.84 ml/m  AORTIC VALVE                     PULMONIC VALVE AV Area (Vmax):    2.11 cm      PV Vmax:       1.39 m/s AV Area (Vmean):   2.35 cm      PV Peak grad:  7.7 mmHg AV Area (VTI):     2.56 cm AV Vmax:           201.00 cm/s AV Vmean:          116.000 cm/s AV VTI:            0.299 m AV Peak Grad:      16.2 mmHg AV Mean Grad:      7.0 mmHg LVOT Vmax:         135.00 cm/s LVOT Vmean:        86.600 cm/s LVOT VTI:          0.244 m LVOT/AV VTI ratio: 0.82  AORTA Ao Root diam: 3.20 cm Ao Asc diam:  2.90 cm MITRAL VALVE MV Area (PHT): 2.91 cm    SHUNTS MV Decel Time: 261 msec    Systemic VTI:  0.24 m MV E velocity: 74.20 cm/s  Systemic Diam: 2.00 cm MV A velocity: 61.10 cm/s MV E/A ratio:  1.21 Toribio Fuel MD Electronically signed by Toribio Fuel MD Signature Date/Time: 09/27/2024/3:23:56 PM    Final       Blood pressure (!) 148/79, pulse 82, temperature 98.3 F (36.8 C), temperature source Oral, resp. rate 17, height 6' 3 (1.905 m), weight (!) 147.6 kg, SpO2 96%.  Medical Problem List and Plan: 1. 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  Status post decompressive lumbar laminectomy, medial facetectomy foraminotomies L3-4 and L4-5 with evacuation of epidural abscess 09/24/2024 per Dr. Alm Molt..NO BRACE NEEDED  -patient may shower with back incision covered  -ELOS/Goals: 7-10 days, mod I to supervision goals with PT,OT 2.  Antithrombotics: -DVT/anticoagulation:  Pharmaceutical: Lovenox  initiated 09/28/2024.  Check vascular study  -antiplatelet therapy: N/A 3. Pain Management: Currently: Celebrex  200 mg every 12 hours, OxyContin  sustained-release 15 mg  every 12 hours, oxycodone  5 mg every 3 hours as needed, Robaxin  750 mg 4 times daily  -increase oxycontin  CR to 20mg  q12  -continue IV dilaudid  for severe pain  -begin tizanidine  2mg  q8 hours for back spasms  -change robaxin  to 750mg  q6 prn breakthrough spasms  -consider trial of lyrica for radicular pain. ( He didn't tolerate gabapentin  however) 4. Mood/Behavior/Sleep/adjustment disorder: Cymbalta  30 mg daily titrated to 60 mg daily per psychiatry services, trazodone  100 mg nightly  -antipsychotic agents: N/A 5. Neuropsych/cognition: This patient is capable of making decisions on his own behalf. 6. Skin/Wound Care: Routine skin checks 7. Fluids/Electrolytes/Nutrition: Routine ins and outs with follow-up chemistries 8.  Streptococcus intermedius bacteremia.  Continue IV ceftriaxone  per infectious disease.  TTE unremarkable.  Repeat blood cultures drawn 1/27 NGTD.  Blood cultures need to be negative for 72 hours prior to PICC line  placement. 9.  Class III obesity.  BMI 40.64.  Lifestyle changes with dietary follow-up 10.  Tobacco use.  NicoDerm patch.  Provide counseling 11.  ? neurogenic bowel and bladder.  Emptying well. Check PVRs. -moved his bowels 2 days ago -scheduled miralax  qam along with senna-s 2 tabs at night -encourage appropriate PO intake    Toribio JINNY Pitch, PA-C 09/29/2024     [1] No Known Allergies  "

## 2024-09-29 NOTE — Discharge Instructions (Signed)
 [facility]RA

## 2024-09-29 NOTE — H&P (Signed)
 Physical Medicine and Rehabilitation Admission H&P        Chief Complaint  Patient presents with   Back Pain  : HPI: Alexander Roberts is a 39 year old right-handed male with unremarkable past medical history except chronic back pain with epidural injection 2 years ago and tobacco use as well as class III obesity BMI 40.64.  Per chart review patient lives with spouse and 58 year old son.  1 level home 4 steps to entry.  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  Admitted 09/24/2024 after two recent ED visits with increasing back pain over the 2 weeks with weakness of bilateral lower extremity as well as reported numbness in his perineum and bilateral lower extremities.  Denied any urinary or fecal incontinence.  MRI initially was done 09/20/2024 as an outpatient without contrast suggestive of discitis and osteomyelitis.  Admission chemistries unremarkable except blood culture Streptococcus intermedius, sedimentation rate 55, C-reactive protein 40.9, glucose 110, chloride 96, WBC 13,500.  Patient was initially placed on broad-spectrum antibiotics.  Neurosurgery Dr. Alm Molt as well as infectious disease Dr. Dennise consulted.  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  Findings were consistent with discitis and osteomyelitis L4-5 and L5-S1 with additional concern for discitis at L3-L4.  Possible osteomyelitis in the posterior L3 vertebral body.  Paraspinal and psoas muscle edema with multiple collections concerning for abscesses, including a partially visualized right paraspinal collection at L4-5 and left paraspinal/psoas collection at L5-S1.  Severe spinal canal stenosis at L2-3, L3-4 and L4-5 with severe right and moderate to severe left foraminal stenosis at  L5-S1.  Patient underwent decompressive lumbar laminectomy, medial facetectomy foraminotomies L3-4 and L4-5 with evacuation of epidural abscess 09/24/2024 per Dr. Alm Molt.NO BRACE NEEDED.  Hospital course follow-up ID for Streptococcus intermedius bacteremia with latest blood cultures 1/27 showing no growth to date.  1/29 TTE with no vegetations.  CT abdomen pelvis with contrast to rule out any intra-abdominal abscess due to high association with pyogenic infection with Streptococcus intermedius showed vague fat stranding along a focal distal sigmoid diverticulum representing resolving or developing acute diverticulitis however doubtful and continue to monitor.  No focal soft tissue abnormality.  Severe degenerative changes at the L4-L5 level.  No acute osseous abnormality.  Patient currently remains on IV ceftriaxone  awaiting full sensitivities follow-up per ID.  Patient was cleared to begin Lovenox  for DVT prophylaxis 09/28/2024.  Psychiatry services consulted 09/26/2024 for adjustment disorder patient with noted increased depression and tearfulness and was started on duloxetine  titrated as needed as well as trazodone  and psychiatry services signed off 09/28/2024.  Therapy evaluations completed due to patient decreased functional mobility was admitted for a comprehensive rehab program.   Review of Systems  Constitutional:  Negative for chills and fever.  HENT:  Negative for hearing loss.   Eyes:  Negative for blurred vision and double vision.  Respiratory:  Negative for cough, shortness of breath and wheezing.   Cardiovascular:  Negative for chest pain, palpitations and leg swelling.  Gastrointestinal:  Positive for constipation. Negative for heartburn, nausea and vomiting.  Genitourinary:  Negative for dysuria, flank pain and hematuria.  Musculoskeletal:  Positive for back pain, joint pain and myalgias.  Skin:  Negative for rash.  Neurological:  Positive for sensory change.       Intermittent bouts  of headache  Psychiatric/Behavioral:  The patient has insomnia.   All other systems reviewed and are negative.      Past Medical History:  Diagnosis Date   Medical history non-contributory               Past Surgical History:  Procedure Laterality Date   EXCISION OF TONGUE LESION N/A 10/23/2019    Procedure: RESECTION OF THYROGLOSSAL REMNANT IN BASE OF TONGUE;  Surgeon: Jesus Oliphant, MD;  Location: [facility] OR;  Service: ENT;  Laterality: N/A;   LUMBAR LAMINECTOMY/DECOMPRESSION MICRODISCECTOMY N/A 09/24/2024    Procedure: LUMBAR LAMINECTOMY/DECOMPRESSION MICRODISCECTOMY 1 LEVEL;  Surgeon: Encarnacion Alm Hamilton, MD;  Location: [facility] OR;  Service: Neurosurgery;  Laterality: N/A;  Lumbar Three - Four, Lumbar Four - Five Laminectomy   NO PAST SURGERIES            History reviewed. No pertinent family history.     Social History:  reports that he has been smoking cigarettes. He has never used smokeless tobacco. He reports current alcohol use. He reports that he does not use drugs. Allergies: [Allergies]  [Allergies] No Known Allergies       Medications Prior to Admission  Medication Sig Dispense Refill   acetaminophen  (TYLENOL ) 500 MG tablet Take 1,000-1,500 mg by mouth every 6 (six) hours as needed for moderate pain (pain score 4-6) or mild pain (pain score 1-3).       celecoxib  (CELEBREX ) 200 MG capsule Take 1 capsule (200 mg total) by mouth 2 (two) times daily. 20 capsule 0   Ibuprofen -Acetaminophen  (ADVIL  DUAL ACTION) 125-250 MG TABS Take 3 tablets by mouth every 6 (six) hours as needed (pain).       Ibuprofen -diphenhydrAMINE Cit (ADVIL  PM) 200-38 MG TABS Take 4 tablets by mouth at bedtime as needed (pain).       methocarbamol  (ROBAXIN ) 750 MG tablet Take 750 mg by mouth 3 (three) times daily.       oxyCODONE -acetaminophen  (PERCOCET) 10-325 MG tablet Take 1-2 tablets by mouth every 6 (six) hours as needed.       pregabalin (LYRICA) 75 MG capsule Take 75 mg by mouth 2 (two) times daily.        lidocaine  (LIDODERM ) 5 % Place 1 patch onto the skin daily. Remove & Discard patch within 12 hours or as directed by MD (Patient not taking: Reported on 09/24/2024) 30 patch 0   promethazine  (PHENERGAN ) 25 MG suppository Place 1 suppository (25 mg total) rectally every 6 (six) hours as needed for nausea or vomiting. 12 suppository 1              Home: Home Living Family/patient expects to be discharged to:: Private residence Living Arrangements: Spouse/significant other, Children Available Help at Discharge: Family, Available 24 hours/day (wife will take FMLA) Type of Home: House Home Access: Stairs to enter [employer] of Steps: 4 Entrance Stairs-Rails: Left Home Layout: One level Bathroom Shower/Tub: Engineer, Manufacturing Systems: Standard Bathroom Accessibility: Yes Home Equipment: Agricultural Consultant (2 wheels)  Lives With: Spouse   Functional History: Prior Function Prior Level of Function : Independent/Modified Independent, Working/employed, Driving Mobility Comments: 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 MinA for LLE negotiation back into bed Transfers Overall transfer level: Needs assistance Equipment used: Rolling walker (2 wheels) Transfers: Sit to/from Stand Sit to Stand: Contact guard assist Bed to/from chair/wheelchair/BSC transfer type:: Step pivot Step pivot transfers: Contact guard assist General transfer comment: Use of momentum to power up Ambulation/Gait Ambulation/Gait  assistance: Contact guard assist Gait Distance (Feet): 75 Feet Assistive device: Rolling walker (2 wheels) Gait Pattern/deviations: Step-to pattern, Decreased dorsiflexion - left, Decreased stance time - left General Gait Details: Pt self cueing for smaller steps for increased control, tactile cues provided to decrease L knee hyperextension. Heavy use of UE's on RW. Gait velocity: decreased Gait velocity interpretation: <1.8 ft/sec, indicate of risk for recurrent falls   ADL: ADL Overall ADL's : Needs assistance/impaired Eating/Feeding: Independent Grooming: Set up, Sitting Upper Body Bathing: Minimal assistance, Sitting Lower Body Bathing: Contact guard assist, With adaptive equipment, Adhering to back precautions Lower Body Bathing Details (indicate cue type and reason): CGA for return demonstration of long handled sponge use Upper Body Dressing : Minimal assistance, Sitting Upper Body Dressing Details (indicate cue type and reason): Min A from spouse Lower Body Dressing: Minimal assistance, With adaptive equipment, Moderate assistance Lower Body Dressing Details (indicate cue type and reason): Min A using reacher and sock aide while returning demonstration for donning/doffing socks. Increased assist needed for pants Toilet Transfer: Moderate assistance, Stand-pivot, BSC/3in1, Rolling walker (2 wheels) Toileting- Clothing Manipulation and Hygiene: Maximal assistance, Sitting/lateral lean, Sit to/from stand   Cognition: Cognition Orientation Level: Oriented X4 Cognition Arousal: Alert Behavior During Therapy: WFL for tasks assessed/performed   Physical Exam: Blood pressure (!) 148/79, pulse 82, temperature 98.3 F (36.8 C), temperature source Oral, resp. rate 17, height 6' 3 (1.905 m), weight (!) 147.6 kg, SpO2 96%. Physical Exam Constitutional:      General: He is in acute distress.  HENT:     Head: Normocephalic and atraumatic.     Right Ear: External ear normal.     Left  Ear: External ear normal.     Nose: Nose normal.     Mouth/Throat:     Mouth: Mucous membranes are moist.     Pharynx: Oropharynx is clear.  Eyes:     Pupils: Pupils are equal, round, and reactive to light.  Cardiovascular:     Rate and Rhythm: Normal rate and regular rhythm.  Pulmonary:     Effort: Pulmonary effort is normal. No respiratory distress.     Breath sounds: No wheezing.  Abdominal:     General: Bowel sounds are normal. There is no distension.     Tenderness: There is no abdominal tenderness.  Musculoskeletal:        General: Tenderness (LB and mid back) present. No swelling. Normal range of motion.     Cervical back: Normal range of motion.  Skin:    General: Skin is warm.     Comments: Back incision CDI. Former drain site with foam dressing  Neurological:     Mental Status: He is alert.     Comments: Alert and oriented x 3. Normal insight and awareness. Intact Memory. Normal language and speech. Cranial nerve exam unremarkable. MMT: BUE 5/5. LLE 4-/5 HF and KE d/t pain, ADF 4+/5, APF 4+/5. RLE 4/5 HF, KE d/t pain. ADF/PF 5/5. Decreased LT/pain RLE along outer side of right lower leg as well as bottom of foot more so  than top of foot. LLE with decreased LT and pain below the knee, worst in feet. DTR's 1+. No abnl resting tone.  Psychiatric:        Mood and Affect: Mood normal.        Behavior: Behavior normal.       Lab Results Last 48 Hours        Results for orders placed or performed during the hospital encounter of 09/24/24 (from the past 48 hours)  Glucose, capillary     Status: Abnormal    Collection Time: 09/27/24  7:50 AM  Result Value Ref Range    Glucose-Capillary 110 (H) 70 - 99 mg/dL      Comment: Glucose reference range applies only to samples taken after fasting for at least 8 hours.    Comment 1 Notify RN    Glucose, capillary     Status: Abnormal    Collection Time: 09/28/24  7:49 AM  Result Value Ref Range    Glucose-Capillary 111 (H) 70 - 99  mg/dL      Comment: Glucose reference range applies only to samples taken after fasting for at least 8 hours.      Imaging Results (Last 48 hours)  CT ABDOMEN PELVIS W CONTRAST Result Date: 09/28/2024 EXAM: CT ABDOMEN AND PELVIS WITH CONTRAST 09/28/2024 08:22:00 PM TECHNIQUE: CT of the abdomen and pelvis was performed with the administration of 75 mL of iohexol  (OMNIPAQUE ) 350 MG/ML injection. Multiplanar reformatted images are provided for review. Automated exposure control, iterative reconstruction, and/or weight-based adjustment of the mA/kV was utilized to reduce the radiation dose to as low as reasonably achievable. COMPARISON: None available. CLINICAL HISTORY: r/o intraabdominal abscess. Rule out intraabdominal abscess. FINDINGS: LOWER CHEST: No acute abnormality. LIVER: The liver is unremarkable. GALLBLADDER AND BILE DUCTS: Gallbladder is unremarkable. No biliary ductal dilatation. SPLEEN: No acute abnormality. PANCREAS: No acute abnormality. ADRENAL GLANDS: No acute abnormality. KIDNEYS, URETERS AND BLADDER: Fluid density lesion in the left kidney likely represents a simple cyst. Simple renal cysts do not require additional follow-up unless clinically indicated due to signs/symptoms. No stones in the kidneys or ureters. No hydronephrosis. No perinephric or periureteral stranding. Urinary bladder is unremarkable. GI AND BOWEL: Vague fat stranding along a focal distal sigmoid diverticulum (5.85, 2.61). No small or large bowel thickening or dilatation. Stool throughout the colon. Stomach demonstrates no acute abnormality. There is no bowel obstruction. APPENDIX: The appendix is unremarkable. PERITONEUM AND RETROPERITONEUM: No ascites. No free air. No organized fluid collection. VASCULATURE: Aorta is normal in caliber. LYMPH NODES: No lymphadenopathy. REPRODUCTIVE ORGANS: No acute abnormality. BONES AND SOFT TISSUES: Severe degenerative changes at the L4 to S1 level. No acute osseous abnormality. No focal  soft tissue abnormality. IMPRESSION: 1. Vague fat stranding along a focal distal sigmoid diverticulum - finding may represent resolving or developing acute diverticulitis. 2. Stool throughout the majority of the colon. Electronically signed by: Morgane Naveau MD 09/28/2024 10:32 PM EST RP Workstation: [id number]    ECHOCARDIOGRAM COMPLETE Result Date: 09/27/2024    ECHOCARDIOGRAM REPORT   Patient Name:   Alexander Roberts Date of Exam: 09/27/2024 Medical Rec #:  [id number]       Height:       75.0 in Accession #:    [id number]      Weight:       322.3 lb Date of Birth:  [birth year]       BSA:          2.688 m Patient Age:  38 years        BP:           149/94 mmHg Patient Gender: M               HR:           76 bpm. Exam Location:  Inpatient Procedure: 2D Echo, Cardiac Doppler and Color Doppler (Both Spectral and Color            Flow Doppler were utilized during procedure). Indications:    Bacteremia R78.81  History:        Patient has no prior history of Echocardiogram examinations.                 Signs/Symptoms:Hypertensive Heart Disease.  Sonographer:    Nathanel Devonshire Referring Phys: [id number] [facility] IMPRESSIONS  1. Left ventricular ejection fraction, by estimation, is 65 to 70%. The left ventricle has normal function. The left ventricle has no regional wall motion abnormalities. Left ventricular diastolic parameters were normal.  2. Right ventricular systolic function is normal. The right ventricular size is normal.  3. The mitral valve is normal in structure. Trivial mitral valve regurgitation. No evidence of mitral stenosis.  4. The aortic valve is normal in structure. Aortic valve regurgitation is not visualized. No aortic stenosis is present.  5. The inferior vena cava is normal in size with greater than 50% respiratory variability, suggesting right atrial pressure of 3 mmHg. Conclusion(s)/Recommendation(s): Technically limited study due to poor sound wave transmission. FINDINGS  Left Ventricle: Left  ventricular ejection fraction, by estimation, is 65 to 70%. The left ventricle has normal function. The left ventricle has no regional wall motion abnormalities. The left ventricular internal cavity size was normal in size. There is  no left ventricular hypertrophy. Left ventricular diastolic parameters were normal. Right Ventricle: The right ventricular size is normal. No increase in right ventricular wall thickness. Right ventricular systolic function is normal. Left Atrium: Left atrial size was normal in size. Right Atrium: Right atrial size was normal in size. Pericardium: There is no evidence of pericardial effusion. Mitral Valve: The mitral valve is normal in structure. Trivial mitral valve regurgitation. No evidence of mitral valve stenosis. Tricuspid Valve: The tricuspid valve is normal in structure. Tricuspid valve regurgitation is trivial. No evidence of tricuspid stenosis. Aortic Valve: The aortic valve is normal in structure. Aortic valve regurgitation is not visualized. No aortic stenosis is present. Aortic valve mean gradient measures 7.0 mmHg. Aortic valve peak gradient measures 16.2 mmHg. Aortic valve area, by VTI measures 2.56 cm. Pulmonic Valve: The pulmonic valve was not well visualized. Pulmonic valve regurgitation is not visualized. No evidence of pulmonic stenosis. Aorta: The aortic root is normal in size and structure. Venous: The inferior vena cava is normal in size with greater than 50% respiratory variability, suggesting right atrial pressure of 3 mmHg. IAS/Shunts: No atrial level shunt detected by color flow Doppler.  LEFT VENTRICLE PLAX 2D LVIDd:         4.20 cm      Diastology LVIDs:         2.90 cm      LV e' medial:    7.94 cm/s LV PW:         1.10 cm      LV E/e' medial:  9.3 LV IVS:        1.10 cm      LV e' lateral:   10.70 cm/s LVOT diam:     2.00 cm  LV E/e' lateral: 6.9 LV SV:         77 LV SV Index:   29 LVOT Area:     3.14 cm  LV Volumes (MOD) LV vol d, MOD A2C: 98.4 ml  LV vol d, MOD A4C: 133.0 ml LV vol s, MOD A2C: 37.4 ml LV vol s, MOD A4C: 33.9 ml LV SV MOD A2C:     61.0 ml LV SV MOD A4C:     133.0 ml LV SV MOD BP:      80.9 ml RIGHT VENTRICLE         IVC TAPSE (M-mode): 2.2 cm  IVC diam: 1.70 cm LEFT ATRIUM             Index LA diam:        3.30 cm 1.23 cm/m LA Vol (A2C):   47.3 ml 17.60 ml/m LA Vol (A4C):   27.9 ml 10.38 ml/m LA Biplane Vol: 37.2 ml 13.84 ml/m  AORTIC VALVE                     PULMONIC VALVE AV Area (Vmax):    2.11 cm      PV Vmax:       1.39 m/s AV Area (Vmean):   2.35 cm      PV Peak grad:  7.7 mmHg AV Area (VTI):     2.56 cm AV Vmax:           201.00 cm/s AV Vmean:          116.000 cm/s AV VTI:            0.299 m AV Peak Grad:      16.2 mmHg AV Mean Grad:      7.0 mmHg LVOT Vmax:         135.00 cm/s LVOT Vmean:        86.600 cm/s LVOT VTI:          0.244 m LVOT/AV VTI ratio: 0.82  AORTA Ao Root diam: 3.20 cm Ao Asc diam:  2.90 cm MITRAL VALVE MV Area (PHT): 2.91 cm    SHUNTS MV Decel Time: 261 msec    Systemic VTI:  0.24 m MV E velocity: 74.20 cm/s  Systemic Diam: 2.00 cm MV A velocity: 61.10 cm/s MV E/A ratio:  1.21 Toribio Fuel MD Electronically signed by Toribio Fuel MD Signature Date/Time: 09/27/2024/3:23:56 PM    Final             Blood pressure (!) 148/79, pulse 82, temperature 98.3 F (36.8 C), temperature source Oral, resp. rate 17, height 6' 3 (1.905 m), weight (!) 147.6 kg, SpO2 96%.   Medical Problem List and Plan: 1. 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  Status post decompressive lumbar laminectomy, medial facetectomy foraminotomies L3-4 and L4-5 with evacuation of epidural abscess 09/24/2024 per Dr. Alm Molt..NO BRACE NEEDED             -patient may shower with back incision covered             -ELOS/Goals: 7-10 days, mod I to supervision goals with PT,OT 2.   Antithrombotics: -DVT/anticoagulation:  Pharmaceutical: Lovenox  initiated 09/28/2024.  Check vascular study             -antiplatelet therapy: N/A 3. Pain Management: Currently: Celebrex  200 mg every 12 hours, OxyContin  sustained-release 15 mg every 12 hours, oxycodone  5 mg every  3 hours as needed, Robaxin  750 mg 4 times daily             -increase oxycontin  CR to 20mg  q12             -continue IV dilaudid  for severe pain             -begin tizanidine  2mg  q8 hours for back spasms             -change robaxin  to 750mg  q6 prn breakthrough spasms             -consider trial of lyrica for radicular pain. ( He didn't tolerate gabapentin  however) 4. Mood/Behavior/Sleep/adjustment disorder: Cymbalta  30 mg daily titrated to 60 mg daily per psychiatry services, trazodone  100 mg nightly             -antipsychotic agents: N/A 5. Neuropsych/cognition: This patient is capable of making decisions on his own behalf. 6. Skin/Wound Care: Routine skin checks 7. Fluids/Electrolytes/Nutrition: Routine ins and outs with follow-up chemistries 8.  Streptococcus intermedius bacteremia.  Continue IV ceftriaxone  per infectious disease.  TTE unremarkable.  Repeat blood cultures drawn 1/27 NGTD.  Blood cultures need to be negative for 72 hours prior to PICC line placement. 9.  Class III obesity.  BMI 40.64.  Lifestyle changes with dietary follow-up 10.  Tobacco use.  NicoDerm patch.  Provide counseling 11.  ? neurogenic bowel and bladder.  Emptying well. Check PVRs. -moved his bowels 2 days ago -scheduled miralax  qam along with senna-s 2 tabs at night -encourage appropriate PO intake     Toribio JINNY Pitch, PA-C 09/29/2024  I have personally performed a face to face diagnostic evaluation of this patient and formulated the key components of the plan.  Additionally, I have personally reviewed laboratory data, imaging studies, as well as relevant notes and concur with the physician assistant's documentation above.  The  patient's status has not changed from the original H&P.  Any changes in documentation from the acute care chart have been noted above.  Arthea IVAR Gunther, MD, L[place]LL[place]

## 2024-09-29 NOTE — Progress Notes (Signed)
 Inpatient Rehabilitation Admission Medication Review by a Pharmacist  A complete drug regimen review was completed for this patient to identify any potential clinically significant medication issues.  High Risk Drug Classes Is patient taking? Indication by Medication  Antipsychotic No   Anticoagulant Yes Lovenox  - DVT px  Antibiotic Yes Ceftriaxone  6wks from 09/26/24 - epidural abscess  Opioid Yes Dilaudid  IV, Oxycontin /oxycodone  - pain  Antiplatelet No   Hypoglycemics/insulin No   Vasoactive Medication No   Chemotherapy No   Other Yes Zanaflex /robaxin  - spasms Atrovent  - COPD Celebrex  - pain Apap - pain Vit D - supplementation Cymbalta  -  Famotidine  - reflux Nicotine  - tobacco Trazodone  - sleep     Type of Medication Issue Identified Description of Issue Recommendation(s)  Drug Interaction(s) (clinically significant)     Duplicate Therapy     Allergy     No Medication Administration End Date     Incorrect Dose     Additional Drug Therapy Needed     Significant med changes from prior encounter (inform family/care partners about these prior to discharge).    Other       Clinically significant medication issues were identified that warrant physician communication and completion of prescribed/recommended actions by midnight of the next day:  No  Name of provider notified for urgent issues identified:   Provider Method of Notification:     Pharmacist comments:   Time spent performing this drug regimen review (minutes):  20   Sergio Batch, PharmD, [place], AAHIVP, CPP Infectious Disease Pharmacist 09/29/2024 1:03 PM

## 2024-09-29 NOTE — Progress Notes (Signed)
 Physical Therapy Treatment Patient Details Name: Alexander Roberts MRN: [id number] DOB: Mar 29, 1986 Today's Date: 09/29/2024   History of Present Illness Pt is a 39 y.o. 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 S/p decompressive lumbar laminectomy, medial facetectomy foraminotomies L3-4 and L4-5 with evacuation of epidural abscess 1/25. Significant PMH: none.    PT Comments  Pt agreeable to participate. He is able to transition to edge of bed via log roll technique modI. Pt ambulating ~40 ft with RW with step through pattern. Increased L knee hyperextension during midstance due to decreased sensorimotor function; provided tactile and verbal cues during gait. Pt denies radicular pain today, however, has severe stabbing, low back pain, limiting distance ambulated today. He is diaphoretic and nauseous following our walk. He is feeling frustrated and down due to continued pain and perceived lack of progress during today's session. PT provided emotional support and encouraged him to reflect on progress he has made in comparison to beginning of week. Patient will benefit from intensive inpatient follow-up therapy, >3 hours/day.    If plan is discharge home, recommend the following: A lot of help with bathing/dressing/bathroom;A little help with walking and/or transfers   Can travel by private vehicle        Equipment Recommendations  BSC/3in1    Recommendations for Other Services Rehab consult     Precautions / Restrictions Precautions Precautions: Fall;Back Precaution Booklet Issued: No Recall of Precautions/Restrictions: Intact Restrictions Weight Bearing Restrictions Per Provider Order: No     Mobility  Bed Mobility Overal bed mobility: Modified Independent             General bed mobility comments: Good log  roll technique, use of bed rail    Transfers Overall transfer level: Needs assistance Equipment used: Rolling walker (2 wheels) Transfers: Sit to/from Stand Sit to Stand: Contact guard assist           General transfer comment: Use of momentum to power up, verbal cues for hand placement    Ambulation/Gait Ambulation/Gait assistance: Contact guard assist Gait Distance (Feet): 40 Feet Assistive device: Rolling walker (2 wheels) Gait Pattern/deviations: Decreased dorsiflexion - left, Decreased stance time - left, Step-through pattern Gait velocity: decreased     General Gait Details: Step through pattern, tactile cueing to decrease L knee hyperextension during mid stance, heavy reliance through UE's on walker   Stairs             Wheelchair Mobility     Tilt Bed    Modified Rankin (Stroke Patients Only)       Balance Overall balance assessment: Needs assistance Sitting-balance support: Feet supported Sitting balance-Leahy Scale: Good     Standing balance support: Bilateral upper extremity supported Standing balance-Leahy Scale: Poor                              Communication Communication Communication: No apparent difficulties  Cognition Arousal: Alert Behavior During Therapy: WFL for tasks assessed/performed   PT - Cognitive impairments: No apparent impairments                         Following commands: Intact      Cueing Cueing Techniques: Verbal cues  Exercises      General Comments  Pertinent Vitals/Pain Pain Assessment Pain Assessment: Faces Faces Pain Scale: Hurts worst Pain Location: low back Pain Descriptors / Indicators: Operative site guarding, Grimacing, Stabbing Pain Intervention(s): Limited activity within patient's tolerance, Monitored during session, Premedicated before session, Ice applied    Home Living                          Prior Function            PT Goals (current  goals can now be found in the care plan section) Acute Rehab PT Goals Patient Stated Goal: to return to work, fishing PT Goal Formulation: With patient Time For Goal Achievement: 10/09/24 Potential to Achieve Goals: Good Progress towards PT goals: Progressing toward goals    Frequency    Min 5X/week      PT Plan      Co-evaluation              AM-PAC PT 6 Clicks Mobility   Outcome Measure  Help needed turning from your back to your side while in a flat bed without using bedrails?: None Help needed moving from lying on your back to sitting on the side of a flat bed without using bedrails?: None Help needed moving to and from a bed to a chair (including a wheelchair)?: A Little Help needed standing up from a chair using your arms (e.g., wheelchair or bedside chair)?: A Little Help needed to walk in hospital room?: A Little Help needed climbing 3-5 steps with a railing? : Total 6 Click Score: 18    End of Session Equipment Utilized During Treatment: Gait belt Activity Tolerance: Patient tolerated treatment well Patient left: with call bell/phone within reach;with family/visitor present;in bed Nurse Communication: Mobility status PT Visit Diagnosis: Unsteadiness on feet (R26.81);Difficulty in walking, not elsewhere classified (R26.2);Pain Pain - part of body:  (back)     Time: 8947-8879 PT Time Calculation (min) (ACUTE ONLY): 28 min  Charges:    $Therapeutic Activity: 23-37 mins PT General Charges $$ ACUTE PT VISIT: 1 Visit                     [name], PT, DPT Acute Rehabilitation Services Office [phone]    [name] 09/29/2024, 11:32 AM

## 2024-09-30 ENCOUNTER — Inpatient Hospital Stay (HOSPITAL_COMMUNITY)

## 2024-09-30 DIAGNOSIS — M4640 Discitis, unspecified, site unspecified: Secondary | ICD-10-CM

## 2024-09-30 DIAGNOSIS — M7989 Other specified soft tissue disorders: Secondary | ICD-10-CM

## 2024-09-30 MED ORDER — HYDROMORPHONE HCL 1 MG/ML IJ SOLN
1.0000 mg | INTRAMUSCULAR | Status: DC | PRN
  Administered 2024-09-30 – 2024-10-03 (×9): 1 mg via INTRAVENOUS
  Filled 2024-09-30 (×11): qty 1

## 2024-09-30 MED ORDER — OXYCODONE HCL 5 MG PO TABS
10.0000 mg | ORAL_TABLET | ORAL | Status: DC | PRN
  Administered 2024-09-30 – 2024-10-01 (×3): 10 mg via ORAL
  Filled 2024-09-30 (×4): qty 2

## 2024-09-30 MED ORDER — MAGNESIUM OXIDE -MG SUPPLEMENT 400 (240 MG) MG PO TABS
200.0000 mg | ORAL_TABLET | Freq: Every day | ORAL | Status: DC
  Administered 2024-09-30 – 2024-10-01 (×2): 200 mg via ORAL
  Filled 2024-09-30 (×2): qty 1

## 2024-09-30 MED ORDER — BACLOFEN 5 MG HALF TABLET
5.0000 mg | ORAL_TABLET | Freq: Three times a day (TID) | ORAL | Status: DC | PRN
  Administered 2024-10-02 (×2): 5 mg via ORAL
  Filled 2024-09-30 (×2): qty 1

## 2024-09-30 NOTE — Progress Notes (Signed)
 SPIRITUAL CARE AND COUNSELING CONSULT NOTE   VISIT SUMMARY: Klein (Pt.) expressed interest in completing a Durable POA rather than a healthcare POA. Chaplain provided education regarding the Healthcare POA available through [facility] and explained that durable POA documents for non-medical matters are not completed within the hospital.  SPIRITUAL ENCOUNTER                                                                                                                                                                      Type of Visit: Initial Care provided to:: Pt and family Referral source: Nurse (RN/NT/LPN) Reason for visit: Advance directives OnCall Visit: Yes   SPIRITUAL FRAMEWORK  Presenting Themes: Impactful experiences and emotions Community/Connection: Family Patient Stress Factors: Health changes Family Stress Factors: Health changes   GOALS     Increase Pt. Understanding of AD planning options and hospital limitations.  INTERVENTIONS   Spiritual Care Interventions Made: Established relationship of care and support, Compassionate presence, Narrative/life review    INTERVENTION OUTCOMES   Outcomes: Awareness of support, Awareness of health, Connection to values and goals of care  SPIRITUAL CARE PLAN     Chaplain will remain available to support Pt.as questions arise regarding spiritual or emotional support.   If immediate needs arise,    Christopher LITTIE Kiang, Chaplain  09/30/2024 2:21 PM

## 2024-09-30 NOTE — Discharge Summary (Shared)
 Physician Discharge Summary  Patient ID: Alexander Roberts MRN: [id number] DOB/AGE: Sep 15, 1985 39 y.o.  Admit date: 09/29/2024 Discharge date:   Discharge Diagnoses:  Principal Problem:   Discitis DVT prophylaxis Pain management Mood stabilization Streptococcus intermedius bacteremia Class III obesity Tobacco use Neurogenic bowel and bladder  Discharged Condition: Stable  Significant Diagnostic Studies: US  EKG SITE RITE Result Date: 09/29/2024 If Site Rite image not attached, placement could not be confirmed due to current cardiac rhythm.  CT ABDOMEN PELVIS W CONTRAST Result Date: 09/28/2024 EXAM: CT ABDOMEN AND PELVIS WITH CONTRAST 09/28/2024 08:22:00 PM TECHNIQUE: CT of the abdomen and pelvis was performed with the administration of 75 mL of iohexol  (OMNIPAQUE ) 350 MG/ML injection. Multiplanar reformatted images are provided for review. Automated exposure control, iterative reconstruction, and/or weight-based adjustment of the mA/kV was utilized to reduce the radiation dose to as low as reasonably achievable. COMPARISON: None available. CLINICAL HISTORY: r/o intraabdominal abscess. Rule out intraabdominal abscess. FINDINGS: LOWER CHEST: No acute abnormality. LIVER: The liver is unremarkable. GALLBLADDER AND BILE DUCTS: Gallbladder is unremarkable. No biliary ductal dilatation. SPLEEN: No acute abnormality. PANCREAS: No acute abnormality. ADRENAL GLANDS: No acute abnormality. KIDNEYS, URETERS AND BLADDER: Fluid density lesion in the left kidney likely represents a simple cyst. Simple renal cysts do not require additional follow-up unless clinically indicated due to signs/symptoms. No stones in the kidneys or ureters. No hydronephrosis. No perinephric or periureteral stranding. Urinary bladder is unremarkable. GI AND BOWEL: Vague fat stranding along a focal distal sigmoid diverticulum (5.85, 2.61). No small or large bowel thickening or dilatation. Stool throughout the colon. Stomach  demonstrates no acute abnormality. There is no bowel obstruction. APPENDIX: The appendix is unremarkable. PERITONEUM AND RETROPERITONEUM: No ascites. No free air. No organized fluid collection. VASCULATURE: Aorta is normal in caliber. LYMPH NODES: No lymphadenopathy. REPRODUCTIVE ORGANS: No acute abnormality. BONES AND SOFT TISSUES: Severe degenerative changes at the L4 to S1 level. No acute osseous abnormality. No focal soft tissue abnormality. IMPRESSION: 1. Vague fat stranding along a focal distal sigmoid diverticulum - finding may represent resolving or developing acute diverticulitis. 2. Stool throughout the majority of the colon. Electronically signed by: Morgane Naveau MD 09/28/2024 10:32 PM EST RP Workstation: [id number]   ECHOCARDIOGRAM COMPLETE Result Date: 09/27/2024    ECHOCARDIOGRAM REPORT   Patient Name:   Alexander Roberts Date of Exam: 09/27/2024 Medical Rec #:  [id number]       Height:       75.0 in Accession #:    [id number]      Weight:       322.3 lb Date of Birth:  06/21/1986       BSA:          2.688 m Patient Age:    38 years        BP:           149/94 mmHg Patient Gender: M               HR:           76 bpm. Exam Location:  Inpatient Procedure: 2D Echo, Cardiac Doppler and Color Doppler (Both Spectral and Color            Flow Doppler were utilized during procedure). Indications:    Bacteremia R78.81  History:        Patient has no prior history of Echocardiogram examinations.                 Signs/Symptoms:Hypertensive Heart Disease.  Sonographer:  Nathanel Devonshire Referring Phys: [id number] [facility] IMPRESSIONS  1. Left ventricular ejection fraction, by estimation, is 65 to 70%. The left ventricle has normal function. The left ventricle has no regional wall motion abnormalities. Left ventricular diastolic parameters were normal.  2. Right ventricular systolic function is normal. The right ventricular size is normal.  3. The mitral valve is normal in structure. Trivial mitral valve  regurgitation. No evidence of mitral stenosis.  4. The aortic valve is normal in structure. Aortic valve regurgitation is not visualized. No aortic stenosis is present.  5. The inferior vena cava is normal in size with greater than 50% respiratory variability, suggesting right atrial pressure of 3 mmHg. Conclusion(s)/Recommendation(s): Technically limited study due to poor sound wave transmission. FINDINGS  Left Ventricle: Left ventricular ejection fraction, by estimation, is 65 to 70%. The left ventricle has normal function. The left ventricle has no regional wall motion abnormalities. The left ventricular internal cavity size was normal in size. There is  no left ventricular hypertrophy. Left ventricular diastolic parameters were normal. Right Ventricle: The right ventricular size is normal. No increase in right ventricular wall thickness. Right ventricular systolic function is normal. Left Atrium: Left atrial size was normal in size. Right Atrium: Right atrial size was normal in size. Pericardium: There is no evidence of pericardial effusion. Mitral Valve: The mitral valve is normal in structure. Trivial mitral valve regurgitation. No evidence of mitral valve stenosis. Tricuspid Valve: The tricuspid valve is normal in structure. Tricuspid valve regurgitation is trivial. No evidence of tricuspid stenosis. Aortic Valve: The aortic valve is normal in structure. Aortic valve regurgitation is not visualized. No aortic stenosis is present. Aortic valve mean gradient measures 7.0 mmHg. Aortic valve peak gradient measures 16.2 mmHg. Aortic valve area, by VTI measures 2.56 cm. Pulmonic Valve: The pulmonic valve was not well visualized. Pulmonic valve regurgitation is not visualized. No evidence of pulmonic stenosis. Aorta: The aortic root is normal in size and structure. Venous: The inferior vena cava is normal in size with greater than 50% respiratory variability, suggesting right atrial pressure of 3 mmHg. IAS/Shunts:  No atrial level shunt detected by color flow Doppler.  LEFT VENTRICLE PLAX 2D LVIDd:         4.20 cm      Diastology LVIDs:         2.90 cm      LV e' medial:    7.94 cm/s LV PW:         1.10 cm      LV E/e' medial:  9.3 LV IVS:        1.10 cm      LV e' lateral:   10.70 cm/s LVOT diam:     2.00 cm      LV E/e' lateral: 6.9 LV SV:         77 LV SV Index:   29 LVOT Area:     3.14 cm  LV Volumes (MOD) LV vol d, MOD A2C: 98.4 ml LV vol d, MOD A4C: 133.0 ml LV vol s, MOD A2C: 37.4 ml LV vol s, MOD A4C: 33.9 ml LV SV MOD A2C:     61.0 ml LV SV MOD A4C:     133.0 ml LV SV MOD BP:      80.9 ml RIGHT VENTRICLE         IVC TAPSE (M-mode): 2.2 cm  IVC diam: 1.70 cm LEFT ATRIUM             Index  LA diam:        3.30 cm 1.23 cm/m LA Vol (A2C):   47.3 ml 17.60 ml/m LA Vol (A4C):   27.9 ml 10.38 ml/m LA Biplane Vol: 37.2 ml 13.84 ml/m  AORTIC VALVE                     PULMONIC VALVE AV Area (Vmax):    2.11 cm      PV Vmax:       1.39 m/s AV Area (Vmean):   2.35 cm      PV Peak grad:  7.7 mmHg AV Area (VTI):     2.56 cm AV Vmax:           201.00 cm/s AV Vmean:          116.000 cm/s AV VTI:            0.299 m AV Peak Grad:      16.2 mmHg AV Mean Grad:      7.0 mmHg LVOT Vmax:         135.00 cm/s LVOT Vmean:        86.600 cm/s LVOT VTI:          0.244 m LVOT/AV VTI ratio: 0.82  AORTA Ao Root diam: 3.20 cm Ao Asc diam:  2.90 cm MITRAL VALVE MV Area (PHT): 2.91 cm    SHUNTS MV Decel Time: 261 msec    Systemic VTI:  0.24 m MV E velocity: 74.20 cm/s  Systemic Diam: 2.00 cm MV A velocity: 61.10 cm/s MV E/A ratio:  1.21 Toribio Fuel MD Electronically signed by Toribio Fuel MD Signature Date/Time: 09/27/2024/3:23:56 PM    Final    DG Lumbar Spine 1 View Result Date: 09/24/2024 CLINICAL DATA:  Elective surgery. Lumbar laminectomy/decompression with micro discectomy, 1 level. EXAM: LUMBAR SPINE - 1 VIEW COMPARISON:  09/17/2024. FINDINGS: There is no evidence of acute lumbar spine fracture. Alignment is normal. Multilevel  intervertebral disc space narrowing, degenerative endplate changes, and facet arthropathy are noted. A needle is present posterior to L5-S1 on image 1. There is a curvilinear structure posterior to L4-L5 on image 2. 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 Electronically Signed   By: Leita Birmingham M.D.   On: 09/24/2024 16:12   MR Lumbar Spine W Wo Contrast Result Date: 09/24/2024 EXAM: MRI LUMBAR SPINE 09/24/2024 02:37:07 PM TECHNIQUE: Multiplanar multisequence MRI of the lumbar spine was performed without and with the administration of intravenous contrast. COMPARISON: 07/31/2025 CLINICAL HISTORY: Low back pain, cauda equina syndrome suspected; Low back pain, infection suspected, positive X-ray/computed tomography. FINDINGS: BONES AND ALIGNMENT: Straightening of the lumbar lordosis. Normal vertebral body heights. There is increased edema throughout the L4 and L5 vertebral bodies. There are areas of signal abnormality and irregularity along the endplates, also increased from prior findings, suggestive of discitis and osteomyelitis. Additional signal abnormality along the L5 inferior endplate. Focal subtle signal abnormality at the posterior aspect of the S1 superior endplate which is new from prior findings are concerning for discitis osteomyelitis also at the L5-S1 level. Degenerative endplate changes posteriorly at L2-L3. Additional Modic type 2 degenerative endplate changes at L5-S1. 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 SPINAL CORD: The conus terminates normally. SOFT TISSUES: There is prominent edema within the paraspinal musculature particularly at L4 and L5 on the right and at L3 through S1 on the left. There is edema noted within the psoas musculature bilaterally. There is a partially visualized paraspinal collection  along the right paraspinal soft tissues at the L4-L5 level concerning for paraspinal abscess seen on series 6 image 1 and series 15 image 1. Possible  additional smaller paraspinal abscess on the right at the L5 level anterior and inferior to the larger collection. Additional partially visualized enhancing paraspinal collections along left aspect of L5-S1 involving the lower aspect of the psoas musculature at this level series 15 image 17 and series 16 image 44. T12-L1: There is minimal disc bulge. No significant spinal canal or foraminal stenosis. L1-L2: There is minimal disc bulge and mild facet arthrosis. No significant spinal canal or foraminal stenosis. L2-L3: There is a diffuse disc bulge. Bilateral facet arthrosis. Prominent epidural fat. Congenital short pedicles. Similar severe spinal canal stenosis. Mild bilateral foraminal stenosis. Small annular fissure noted. L3-L4: There is a diffuse disc bulge. Moderate facet arthrosis. Prominent epidural fat. Congenital short pedicles. Additional contribution to spinal canal stenosis from epidural abscess. Similar severe spinal canal stenosis. Mild bilateral facet arthrosis. Additional areas of prominent epidural soft tissue and enhancement along the posterior aspect of L3 which is increased from prior. Within this region there is a 0.9 cm enhancing collection concerning for epidural abscess series 16 image 28. Subtle signal abnormality in the posterior aspect of L3 with edema and mild enhancement concerning for additional focus of osteomyelitis. There is also subtle signal abnormality in the L3-L4 disc which appears new from prior and may reflect additional areas of discitis. L4-L5: There is a disc bulge and posterior osteophytes. Bilateral facet arthrosis. Prominence of the epidural fat. Epidural abscess also contributing to spinal canal stenosis. Similar severe spinal canal stenosis. There is moderate to severe bilateral foraminal stenosis. There is increased abnormal fluid within the L4-L5 disc space. There are areas of signal abnormality and irregularity along the endplates, also increased from prior findings,  suggestive of discitis and osteomyelitis. There is prominent abnormal soft tissue within the ventral epidural fat along the posterior aspect of L4 and L5. There is edema and abnormal enhancement within this region. At the L4 level there is a small hypointense focus with peripheral enhancement right of midline just above the level of the disc which measures up to 0.4 cm which is concerning for epidural abscess series 16 image 36. Additional prominent epidural soft tissue at L5 also with a peripherally enhancing component at midline along the mid aspect of L5 which measures 0.8 x 0.7 cm concerning for additional epidural abscess series 16 image 4. L5-S1: There is a diffuse disc bulge and posterior osteophytes. Bilateral facet arthrosis. Epidural abscess also contributes to spinal canal stenosis. There is similar appearance of moderate spinal canal stenosis and lateral recess narrowing. There is severe right and moderate to severe left foraminal stenosis. There is slightly increased signal abnormality within the L5-S1 disc. Focal subtle signal abnormality at the posterior aspect of the S1 superior endplate which is new from prior findings are concerning for discitis osteomyelitis also at the L5-S1 level. VISUALIZED ABDOMEN AND PELVIS: There are areas of diverticuli along the partially visualized sigmoid colon. Mildly distended urinary bladder. Cyst in the posterior aspect of the left kidney. LIMITATIONS/ARTIFACTS: Axial images are limited by motion artifact. IMPRESSION: 1. 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 2. Findings consistent with discitis and osteomyelitis at L4-L5 and L5-S1 levels, with additional concern for discitis at L3-L4. Possible osteomyelitis in the posterior L3 vertebral  body. 3. Paraspinal and psoas muscle edema with multiple collections concerning  for abscesses, including a partially visualized right paraspinal collection at L4-L5 and left paraspinal/psoas collections at L5-S1. 4. Severe spinal canal stenosis at L2-L3, L3-L4, and L4-L5 levels, with severe right and moderate to severe left foraminal stenosis at L5-S1. Electronically signed by: Donnice Mania MD 09/24/2024 03:17 PM EST RP Workstation: [id number]   MR LUMBAR SPINE WO CONTRAST Result Date: 09/20/2024 EXAM: MRI LUMBAR SPINE 09/20/2024 03:38:31 PM TECHNIQUE: Multiplanar multisequence MRI of the lumbar spine was performed without the administration of intravenous contrast. COMPARISON: MRI lumbar spine 01/15/2015. CLINICAL HISTORY: Lumbar back pain with radiculopathy affecting left lower extremity. FINDINGS: BONES AND ALIGNMENT: 5 lumbar type vertebrae. Straightening of the normal lumbar lordosis. No significant listhesis. No acute fracture. Increased marrow edema in the L4 and L5 vertebral bodies extending into the L5 pedicles and STIR hyperintensity/fluid signal in the L4-5 disc. There is a Schmorl node involving the L5 inferior endplate, however there is no frank endplate erosion. Moderate to prominent Modic type 2 endplate changes at L5-S1. Diffuse congenital narrowing of the lumbar spinal canal due to short pedicles. SPINAL CORD: The conus medullaris terminates at T12 and is normal in signal. SOFT TISSUES: Mild edema in the lower lumbar paraspinal soft tissues bilaterally including involvement of the left greater than right psoas muscles. No evidence of a paraspinal fluid collection. T12-L1: Mild to moderate facet hypertrophy and at most minimal disc bulging without stenosis. L1-L2: Moderate facet hypertrophy without disc herniation or stenosis. L2-L3: Disc bulging, a chronic broad central disc protrusion with annular fissure, congenitally short pedicles, prominent epidural fat, and moderate facet hypertrophy result in severe spinal stenosis and mild bilateral neural foraminal stenosis,  progressed from prior. L3-L4: Disc bulging, a right paracentral disc extrusion, congenitally short pedicles, prominent epidural fat, and moderate facet and ligamentum flavum hypertrophy result in severe spinal stenosis, right greater than left lateral recess stenosis, and mild bilateral neural foraminal stenosis, progressed from prior. L4-L5: Circumferential disc bulging, a central disc extrusion, endplate spurring, congenitally short pedicles, prominent epidural fat, and moderate facet hypertrophy result in severe spinal stenosis and moderate to severe right and severe left neural foraminal stenosis, progressed from prior. There is heterogeneous expansion of the ventral epidural space behind the L4 and L5 vertebral bodies which may reflect epidural fat expansion and engorgement of the epidural venous plexus, although an element of epidural hematoma or phlegmon is also possible. L5-S1: The large right paracentral disc extrusion and possible associated epidural hematoma on the prior MRI have resolved with improved spinal canal and lateral recess patency. Circumferential disc bulging, endplate spurring, congenitally short pedicles, and mild facet hypertrophy result in moderate spinal stenosis, moderate bilateral lateral recess stenosis, and severe right and moderate left neural foraminal stenosis. IMPRESSION: 1. Progressive, severe multifactorial spinal and neural foraminal stenosis at L4-5. Abnormal signal in the disc space and mild vertebral marrow edema may be degenerative as there is no frank endplate erosion; however, there is mild edema in the paraspinal soft tissues and discitis-osteomyelitis is a consideration in the appropriate clinical setting. Abnormal ventral epidural material at L4 and L5 could reflect venous engorgement versus epidural or phlegmon. 2. Progressive, severe spinal stenosis at L2-3 and L3-4. 3. Moderate spinal stenosis and severe neural foraminal stenosis at L5-S1. Electronically signed by:  Dasie Hamburg MD 09/20/2024 04:40 PM EST RP Workstation: [id number]   DG Lumbar Spine 2-3 Views Result Date: 09/15/2024 EXAM: 2 OR 3 VIEW(S) XRAY OF THE LUMBAR SPINE 09/15/2024  04:40:01 AM COMPARISON: Lumbar spine series dated 02/09/2007. CLINICAL HISTORY: Back pain. FINDINGS: LUMBAR SPINE: BONES: Vertebral body heights are maintained. There is a mild dextrocurvature of the lumbar spine. There is straightening of the normal lumbar lordosis. DISCS AND DEGENERATIVE CHANGES: There is mild disc space narrowing at L3-L4. Since the previous study, the patient has developed moderate chronic degenerative disc disease at L4-L5. There is moderate disc space narrowing, endplate sclerosis and anterior osteophyte formation. Patient has also developed mild-to-moderate chronic degenerative disc disease at L5-S1, also with endplate sclerosis and anterior osteophytosis. SOFT TISSUES: No acute abnormality. IMPRESSION: 1. Mild dextrocurvature of the lumbar spine and straightening of the normal lumbar lordosis. 2. Moderate chronic degenerative disc disease at L4-5 and mild-to-moderate chronic degenerative disc disease at L5-S1, with disc space narrowing, endplate sclerosis, and anterior osteophyte formation. 3. Mild disc space narrowing at L3-4. Electronically signed by: Evalene Coho MD 09/15/2024 04:53 AM EST RP Workstation: [id number]    Labs:  Basic Metabolic Panel: Recent Labs  Lab 09/24/24 0156 09/24/24 0751 09/25/24 0413 09/26/24 0555 09/29/24 1529  NA 135  --  131* 132*  --   K 4.3  --  4.3 4.1  --   CL 96*  --  93* 96*  --   CO2 27  --  26 24  --   GLUCOSE 110*  --  117* 89  --   BUN 11  --  13 13  --   CREATININE 0.78 0.72 0.76 0.70 0.84  CALCIUM  10.2  --  9.6 9.3  --   MG  --  1.7  --   --   --   PHOS  --  4.3  --   --   --     CBC: Recent Labs  Lab 09/24/24 0156 09/25/24 0413 09/26/24 0555 09/29/24 1529  WBC 13.5* 15.5* 10.7* 9.9  NEUTROABS 10.4* 13.5* 7.4  --   HGB 12.7* 11.5* 11.6*  12.4*  HCT 36.9* 32.6* 33.8* 37.4*  MCV 80.7 79.1* 81.6 83.9  PLT 330 347 403* 636*    CBG: Recent Labs  Lab 09/25/24 1627 09/26/24 0755 09/27/24 0750 09/28/24 0749 09/29/24 0752  GLUCAP 101* 103* 110* 111* 102*    Brief HPI:   Alexander Roberts is a 39 y.o. right-handed male with unremarkable past medical history except chronic back pain with epidural injection 2 years ago as well as tobacco use and class III obesity with BMI 40.61.  Per chart review lives with spouse and 44 year old son.  1 level home 4 steps to entry.  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  Admitted 09/24/2018 2:26 recent ED visits with increasing back pain over the past 2 weeks with weakness of bilateral lower extremity as well as reported numbness in his perineum and bilateral lower extremities.  Denied any urinary or fecal incontinence.  MRI initially was done 09/20/2024 as an outpatient without contrast suggestive of discitis with osteomyelitis.  Admission chemistries unremarkable except blood cultures Streptococcus intermedius, sedimentation rate 55, C-reactive protein 40.9 glucose 110 WBC 13,500.  Placed on broad-spectrum antibiotics.  Neurosurgery Dr. Alm Molt as well as infectious disease Dr. Dennise consulted.  Follow-up MRI 09/24/2024 showed multiple epidural abscesses L3-4 and L5 levels contributing to severe spinal canal stenosis L3-4 L4-5 and moderate spinal canal stenosis L5-S1 with lateral recess narrowing.  Findings consistent with discitis and osteomyelitis L4-5 and L5-S1 with additional concern for discitis at L3-4.  Possible osteomyelitis in  the posterior L3 vertebral body.  Paraspinal and psoas muscle edema with multiple collections concerning for abscesses, including a partially visualized right paraspinal collection at L4-5 and left paraspinal/psoas collection L5-S1.  Patient underwent decompressive lumbar laminectomy foraminotomies L3-4  and L4-5 with evacuation of epidural abscess 09/24/2024 per Dr. Alm Molt.  No brace required.  Hospital course follow-up infectious disease for Streptococcus intermedius bacteremia with latest blood cultures 1/27 showing no growth to date.  1/29 TTE with no vegetation.  CT abdomen pelvis with contrast to rule out any intra-abdominal abscess due to high association with pyogenic infection with Streptococcus intermedius showed vague fat stranding along a focal distal sigmoid diverticulum representing a resolving or developing acute diverticulitis however doubtful and continue to monitor.  No focal soft tissue abnormality.  No acute osseous abnormality.  Patient currently remained on IV ceftriaxone  awaiting full sensitivities follow-up per ID services.  He was cleared to begin Lovenox  for DVT prophylaxis 09/28/2024.  Psychiatry services consulted 09/26/2024 for adjustment disorder patient was noted increased depression and tearfulness and started on duloxetine  titrated as needed with trazodone  to help aid in sleep and psychiatry services signed off 09/28/2024.  Therapy evaluations completed due to patient decreased functional mobility was admitted for a comprehensive rehab program.   Hospital Course: Alexander Roberts was admitted to rehab 09/29/2024 for inpatient therapies to consist of PT, ST and OT at least three hours five days a week. Past admission physiatrist, therapy team and rehab RN have worked together to provide customized collaborative inpatient rehab.  Pertaining to patient's osteomyelitis/discitis lumbar spine with ventral epidural abscesses and severe spinal stenosis with early cauda equina syndrome.  Status post decompressive lumbar laminectomy foraminotomies L3-4 L4-5 and evacuation of epidural abscess 09/24/2024 per Dr. Alm Molt.  No brace needed.  In regards to patient's Streptococcus intermedius bacteremia remained on IV ceftriaxone  per infectious disease awaiting duration of antibiotics TEE  unremarkable.  Patient had been cleared for Lovenox  for DVT prophylaxis venous Doppler studies negative.  Pain management use of Celebrex  200 mg every 12 hours with OxyContin  sustained-release added titrated as needed with oxycodone  immediate release for breakthrough pain scheduled Robaxin  750 mg every 4 hours initially with the use of IV Dilaudid  for severe pain and started on Zanaflex  2 mg every 8 hours for back spasms.  Considering course trial of Lyrica for radicular pain if needed.  Class III obesity BMI 40.64 lifestyle changes dietary follow-up.  He did have a history of tobacco use maintained on NicoDerm patch providing counseling.  Suspect neurogenic bowel and bladder emptying well check PVRs moved bowels recently.   Blood pressures were monitored on TID basis and remained controlled and monitored     Rehab course: During patient's stay in rehab weekly team conferences were held to monitor patient's progress, set goals and discuss barriers to discharge. At admission, patient required contact-guard 75 feet rolling walker contact-guard step pivot transfers  He/She  has had improvement in activity tolerance, balance, postural control as well as ability to compensate for deficits. He/She has had improvement in functional use RUE/LUE  and RLE/LLE as well as improvement in awareness       Disposition:  There are no questions and answers to display.         Diet: Regular  Special Instructions: No driving smoking or alcohol  Medications at discharge 1.  Tylenol  as needed 2.  Rocephin  2 g daily x 6 weeks total from 09/26/2024 3.  Celebrex  200 mg every 12 hours 4.  Vitamin  D 1000 units p.o. daily 5.  Cymbalta  60 mg daily 7.  Pepcid  20 mg p.o. daily 8.  Robaxin -750 milligram p.o. every 6 hours as needed muscle spasms 9.  NicoDerm patch taper as directed 10.  Oxycodone  immediate release 10 mg every 3 hours as needed moderate pain 11.  OxyContin  sustained-release 20 mg every 12  hours 12.  MiraLAX  daily hold for loose stools 13.  Zanaflex  2 mg p.o. every 8 hours 14.  Trazodone  100 mg p.o. nightly  30-35 minutes are spent completing discharge summary and discharge planning     Follow-up Information     Lovorn, Duwaine, MD Follow up.   Specialty: Physical Medicine and Rehabilitation Why: Office to call for appointment Contact information: [address] [place] [address] [phone]         Fulbright, Virginia  E, PA-C Follow up.   Specialty: Family Medicine Why: Call for appointment Contact information: [address] [place] [address] [phone]         Szymon Alm Hamilton, MD Follow up.   Specialty: Neurosurgery Why: Call for appointment Contact information: [address] [place] [address] [phone]         Dennise Kingsley, MD Follow up.   Specialty: Infectious Diseases Why: Call for appointment Contact information: [address], [address] [place] [address] [phone]                 Signed: Toribio JINNY Pitch 09/30/2024, 12:07 PM

## 2024-09-30 NOTE — Discharge Instructions (Signed)
 Inpatient Rehab Discharge Instructions  Alexander Roberts Discharge date and time: No discharge date for patient encounter.   Activities/Precautions/ Functional Status: Activity: As tolerated Diet: Regular Wound Care: Routine skin checks Functional status:  ___ No restrictions     ___ Walk up steps independently ___ 24/7 supervision/assistance   ___ Walk up steps with assistance ___ Intermittent supervision/assistance  ___ Bathe/dress independently ___ Walk with walker     _x__ Bathe/dress with assistance ___ Walk Independently    ___ Shower independently ___ Walk with assistance    ___ Shower with assistance ___ No alcohol     ___ Return to work/school ________  Special Instructions:  No driving smoking or alcohol  My questions have been answered and I understand these instructions. I will adhere to these goals and the provided educational materials after my discharge from the hospital.  Patient/Caregiver Signature _______________________________ Date __________  Clinician Signature _______________________________________ Date __________  Please bring this form and your medication list with you to all your follow-up doctor's appointments.

## 2024-09-30 NOTE — Plan of Care (Signed)
" °  Problem: RH Balance Goal: LTG Patient will maintain dynamic sitting balance (PT) Description: LTG:  Patient will maintain dynamic sitting balance with assistance during mobility activities (PT) Flowsheets (Taken 09/30/2024 1619) LTG: Pt will maintain dynamic sitting balance during mobility activities with:: Independent with assistive device  Goal: LTG Patient will maintain dynamic standing balance (PT) Description: LTG:  Patient will maintain dynamic standing balance with assistance during mobility activities (PT) Flowsheets (Taken 09/30/2024 1619) LTG: Pt will maintain dynamic standing balance during mobility activities with:: Supervision/Verbal cueing   Problem: Sit to Stand Goal: LTG:  Patient will perform sit to stand with assistance level (PT) Description: LTG:  Patient will perform sit to stand with assistance level (PT) Flowsheets (Taken 09/30/2024 1619) LTG: PT will perform sit to stand in preparation for functional mobility with assistance level: Independent with assistive device   Problem: RH Bed Mobility Goal: LTG Patient will perform bed mobility with assist (PT) Description: LTG: Patient will perform bed mobility with assistance, with/without cues (PT). Flowsheets (Taken 09/30/2024 1619) LTG: Pt will perform bed mobility with assistance level of: Independent with assistive device    Problem: RH Bed to Chair Transfers Goal: LTG Patient will perform bed/chair transfers w/assist (PT) Description: LTG: Patient will perform bed to chair transfers with assistance (PT). Flowsheets (Taken 09/30/2024 1619) LTG: Pt will perform Bed to Chair Transfers with assistance level: Independent with assistive device    Problem: RH Car Transfers Goal: LTG Patient will perform car transfers with assist (PT) Description: LTG: Patient will perform car transfers with assistance (PT). Flowsheets (Taken 09/30/2024 1619) LTG: Pt will perform car transfers with assist:: Supervision/Verbal cueing    Problem: RH Furniture Transfers Goal: LTG Patient will perform furniture transfers w/assist (OT/PT) Description: LTG: Patient will perform furniture transfers  with assistance (OT/PT). Flowsheets (Taken 09/30/2024 1619) LTG: Pt will perform furniture transfers with assist:: Independent with assistive device    Problem: RH Ambulation Goal: LTG Patient will ambulate in controlled environment (PT) Description: LTG: Patient will ambulate in a controlled environment, # of feet with assistance (PT). Flowsheets (Taken 09/30/2024 1619) LTG: Pt will ambulate in controlled environ  assist needed:: Independent with assistive device LTG: Ambulation distance in controlled environment: 150 feet with LRAD Goal: LTG Patient will ambulate in home environment (PT) Description: LTG: Patient will ambulate in home environment, # of feet with assistance (PT). Flowsheets (Taken 09/30/2024 1619) LTG: Pt will ambulate in home environ  assist needed:: Independent with assistive device LTG: Ambulation distance in home environment: 50 feet with LRAD   Problem: RH Stairs Goal: LTG Patient will ambulate up and down stairs w/assist (PT) Description: LTG: Patient will ambulate up and down # of stairs with assistance (PT) Flowsheets (Taken 09/30/2024 1619) LTG: Pt will ambulate up/down stairs assist needed:: Supervision/Verbal cueing LTG: Pt will  ambulate up and down number of stairs: 4 steps with LRAD for home entry   "

## 2024-09-30 NOTE — Evaluation (Signed)
 Occupational Therapy Assessment and Plan  Patient Details  Name: Alexander Roberts MRN: [id number] Date of Birth: [birth year]  OT Diagnosis: abnormal posture, acute pain, lumbago (low back pain), and muscle weakness (generalized) Rehab Potential: Rehab Potential (ACUTE ONLY): Fair ELOS: 10-14 days   Today's Date: 09/30/2024 OT Individual Time: 8953-8798 OT Individual Time Calculation (min): 75 min     Hospital Problem: Principal Problem:   Discitis   Past Medical History:  Past Medical History:  Diagnosis Date   Medical history non-contributory    Past Surgical History:  Past Surgical History:  Procedure Laterality Date   EXCISION OF TONGUE LESION N/A 10/23/2019   Procedure: RESECTION OF THYROGLOSSAL REMNANT IN BASE OF TONGUE;  Surgeon: Jesus Oliphant, MD;  Location: [facility] OR;  Service: ENT;  Laterality: N/A;   LUMBAR LAMINECTOMY/DECOMPRESSION MICRODISCECTOMY N/A 09/24/2024   Procedure: LUMBAR LAMINECTOMY/DECOMPRESSION MICRODISCECTOMY 1 LEVEL;  Surgeon: Secundino Alm Hamilton, MD;  Location[facility] OR;  Service: Neurosurgery;  Laterality: N/A;  Lumbar Three - Four, Lumbar Four - Five Laminectomy   NO PAST SURGERIES      Assessment & Plan Clinical Impression: Alexander Roberts is a 39 year old right-handed male with unremarkable past medical history except chronic back pain with epidural injection 2 years ago and tobacco use as well as class III obesity BMI 40.64.  Per chart review patient lives with spouse and 10 year old son.  1 level home 4 steps to entry.  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  Admitted 09/24/2024 after two recent ED visits with increasing back pain over the 2 weeks with weakness of bilateral lower extremity as well as reported numbness in his perineum and bilateral lower extremities.  Denied any urinary or fecal incontinence.  MRI initially was done 09/20/2024 as an outpatient without contrast suggestive of  discitis and osteomyelitis.  Admission chemistries unremarkable except blood culture Streptococcus intermedius, sedimentation rate 55, C-reactive protein 40.9, glucose 110, chloride 96, WBC 13,500.  Patient was initially placed on broad-spectrum antibiotics.  Neurosurgery Dr. Alm Lionel as well as infectious disease Dr. Dennise consulted.  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  Findings were consistent with discitis and osteomyelitis L4-5 and L5-S1 with additional concern for discitis at L3-L4.  Possible osteomyelitis in the posterior L3 vertebral body.  Paraspinal and psoas muscle edema with multiple collections concerning for abscesses, including a partially visualized right paraspinal collection at L4-5 and left paraspinal/psoas collection at L5-S1.  Severe spinal canal stenosis at L2-3, L3-4 and L4-5 with severe right and moderate to severe left foraminal stenosis at L5-S1.  Patient underwent decompressive lumbar laminectomy, medial facetectomy foraminotomies L3-4 and L4-5 with evacuation of epidural abscess 09/24/2024 per Dr. Alm Cass.NO BRACE NEEDED.  Hospital course follow-up ID for Streptococcus intermedius bacteremia with latest blood cultures 1/27 showing no growth to date.  1/29 TTE with no vegetations.  CT abdomen pelvis with contrast to rule out any intra-abdominal abscess due to high association with pyogenic infection with Streptococcus intermedius showed vague fat stranding along a focal distal sigmoid diverticulum representing resolving or developing acute diverticulitis however doubtful and continue to monitor.  No focal soft tissue abnormality.  Severe degenerative changes at the L4-L5 level.  No acute osseous abnormality.  Patient currently remains on IV ceftriaxone  awaiting full sensitivities follow-up per ID.   Patient was  cleared to begin Lovenox  for DVT prophylaxis 09/28/2024.  Psychiatry services consulted 09/26/2024 for adjustment disorder patient with noted increased depression and tearfulness and was started on duloxetine  titrated as needed as well as trazodone  and psychiatry services signed off 09/28/2024.  Therapy evaluations completed due to patient decreased functional mobility was admitted for a comprehensive rehab program. Patient transferred to CIR on 09/29/2024 .    Patient currently requires min with basic self-care skills secondary to muscle weakness, decreased cardiorespiratoy endurance, decreased coordination, and decreased sitting balance, decreased standing balance, and decreased postural control.  Prior to hospitalization, patient could complete working, driving, BADLs with independent .  Patient will benefit from skilled intervention to decrease level of assist with basic self-care skills and increase independence with basic self-care skills prior to discharge home with care partner.  Anticipate patient will require intermittent supervision and follow up outpatient.  OT - End of Session Activity Tolerance: Tolerates 10 - 20 min activity with multiple rests Endurance Deficit: Yes Endurance Deficit Description: pt limited by significant pain dominance and fatiuge, only able to tolerate sitting OOB in standard WC and/or TIS for 5-10 minutes before needing to return to bed OT Assessment Rehab Potential (ACUTE ONLY): Fair OT Barriers to Discharge: Other (comments) OT Plan OT Intensity: Minimum of 1-2 x/day, 45 to 90 minutes OT Frequency: 5 out of 7 days OT Duration/Estimated Length of Stay: 10-14 days OT Treatment/Interventions: Balance/vestibular training;Cognitive remediation/compensation;Community reintegration;Discharge planning;Disease mangement/prevention;DME/adaptive equipment instruction;Functional electrical stimulation;Functional mobility training;Neuromuscular re-education;Pain  management;Patient/family education;Psychosocial support;Self Care/advanced ADL retraining;Skin care/wound managment;Splinting/orthotics;Therapeutic Activities;Therapeutic Exercise;UE/LE Strength taining/ROM;UE/LE Coordination activities;Visual/perceptual remediation/compensation;Wheelchair propulsion/positioning OT Self Feeding Anticipated Outcome(s): Mod-I OT Basic Self-Care Anticipated Outcome(s): Mod-I OT Toileting Anticipated Outcome(s): Mod-I OT Bathroom Transfers Anticipated Outcome(s): Mod-I OT Recommendation Recommendations for Other Services: Neuropsych consult;Therapeutic Recreation consult Therapeutic Recreation Interventions: Pet therapy;Stress management Patient destination: Home Follow Up Recommendations: Outpatient OT Equipment Recommended: Tub/shower bench   OT Evaluation Precautions/Restrictions  Precautions Precautions: Fall;Back Recall of Precautions/Restrictions: Intact Precaution/Restrictions Comments: Fall, spinal Restrictions Weight Bearing Restrictions Per Provider Order: No Pain Pain Assessment Pain Scale: 0-10 Pain Score: 9  Pain Location: Back Pain Intervention(s): Medication (See eMAR) Home Living/Prior Functioning Home Living Family/patient expects to be discharged to:: Private residence Living Arrangements: Spouse/significant other Available Help at Discharge: Family, Available 24 hours/day Type of Home: House Home Access: Stairs to enter [employer] of Steps: 4 Entrance Stairs-Rails: Right, Left, Can reach both Home Layout: One level Bathroom Shower/Tub: [employer] Operational Officer Accessibility: Yes  Lives With: Spouse, Son (wife available 24/7 for 6 weeks) IADL History Homemaking Responsibilities: Yes Meal Prep Responsibility: No Laundry Responsibility: Primary Cleaning Responsibility: Primary Bill Paying/Finance Responsibility: Primary Shopping Responsibility: No Child Care Responsibility:  (small  dog) Current License: Yes Mode of Transportation: Car Occupation: Full time employment 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 Leisure and Hobbies: fishing, video games, 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, technical sales engineer Prior Function Level of Independence: Independent with basic ADLs, Independent with gait, Independent with homemaking with ambulation, Independent with transfers  Able to Take Stairs?: Yes Driving: Yes Vocation: Full time employment 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, stocking shelves) Leisure: Hobbies-yes (Comment) Vision Baseline Vision/History: 0 No visual deficits Ability to See in Adequate Light: 0 Adequate Patient Visual Report: Eye fatigue/eye pain/headache (headaches after medication) Vision Assessment?: No apparent visual deficits Perception  Perception: Within Functional Limits Praxis Praxis: WFL Cognition Cognition Overall Cognitive Status: Impaired/Different from baseline Arousal/Alertness: Awake/alert Memory: Appears intact Awareness: Appears intact Problem Solving: Appears  intact Safety/Judgment: Appears intact Brief Interview for Mental Status (BIMS) Repetition of Three Words (First Attempt): 3 Temporal Orientation: Year: Correct Temporal Orientation: Month: Accurate within 5 days Temporal Orientation: Day: Correct Recall: Sock: Yes, no cue required Recall: Blue: Yes, no cue required Recall: Bed: Yes, no cue required BIMS Summary Score: 15 Sensation Sensation Light Touch: Impaired by gross assessment Proprioception: Impaired by gross assessment Additional Comments: Pt endorses N&T B LE - pt able to detect light touch in all dermatomes, Pt endorses light touch diminished in B LE below the knee (more diminished on L LE in comparison to R LLE). Pt endorses N&T in buttocks and groin Coordination Gross Motor Movements are Fluid and Coordinated: No Fine Motor Movements are Fluid and Coordinated: Yes Coordination  and Movement Description: limited by pain and weakness in B LE, and cramping in B UE Finger Nose Finger Test: [facility] bilaterally Heel Shin Test: WFL on R LE, unable to complete on L LE 2/2 pain and weakness Motor  Motor Motor: Abnormal postural alignment and control  Trunk/Postural Assessment  Cervical Assessment Cervical Assessment: Within Functional Limits Thoracic Assessment Thoracic Assessment: Within Functional Limits Lumbar Assessment Lumbar Assessment: Exceptions to [facility] (posterior pelvic tilt) Postural Control Postural Control: Deficits on evaluation Postural Limitations: overall very guarded and dependent on B UE for trunk control and pain management  Balance Balance Balance Assessed: Yes Static Sitting Balance Static Sitting - Balance Support: Feet supported;Bilateral upper extremity supported Static Sitting - Level of Assistance: 5: Stand by assistance (CGA) Dynamic Sitting Balance Dynamic Sitting - Balance Support: During functional activity;Bilateral upper extremity supported Dynamic Sitting - Level of Assistance: 4: Min assist Sitting balance - Comments: Reliant on BUE support due to pain Static Standing Balance Static Standing - Balance Support: Bilateral upper extremity supported;During functional activity Static Standing - Level of Assistance: 5: Stand by assistance Dynamic Standing Balance Dynamic Standing - Balance Support: Bilateral upper extremity supported;During functional activity Dynamic Standing - Level of Assistance: 4: Min assist Extremity/Trunk Assessment RUE Assessment RUE Assessment: Within Functional Limits LUE Assessment LUE Assessment: Within Functional Limits  Care Tool Care Tool Self Care Eating   Eating Assist Level: Minimal Assistance - Patient > 75%    Oral Care    Oral Care Assist Level: Set up assist    Bathing   Body parts bathed by patient: Right arm;Left arm;Chest;Abdomen;Front perineal area;Buttocks;Right upper leg;Face;Left  upper leg Body parts bathed by helper: Left lower leg;Right lower leg   Assist Level: Minimal Assistance - Patient > 75%    Upper Body Dressing(including orthotics)   What is the patient wearing?: Pull over shirt   Assist Level: Set up assist    Lower Body Dressing (excluding footwear)   What is the patient wearing?: Underwear/pull up;Pants Assist for lower body dressing: Minimal Assistance - Patient > 75%    Putting on/Taking off footwear   What is the patient wearing?: Socks;Shoes Assist for footwear: Minimal Assistance - Patient > 75%       Care Tool Toileting Toileting activity   Assist for toileting: Minimal Assistance - Patient > 75%     Care Tool Bed Mobility Roll left and right activity   Roll left and right assist level: Contact Guard/Touching assist    Sit to lying activity   Sit to lying assist level: Contact Guard/Touching assist    Lying to sitting on side of bed activity   Lying to sitting on side of bed assist level: the ability to move from lying on  the back to sitting on the side of the bed with no back support.: Minimal Assistance - Patient > 75%     Care Tool Transfers Sit to stand transfer   Sit to stand assist level: Minimal Assistance - Patient > 75%    Chair/bed transfer   Chair/bed transfer assist level: Minimal Assistance - Patient > 75%     Toilet transfer Toilet transfer activity did not occur: Safety/medical concerns Assist Level: Minimal Assistance - Patient > 75%     Care Tool Cognition  Expression of Ideas and Wants Expression of Ideas and Wants: 4. Without difficulty (complex and basic) - expresses complex messages without difficulty and with speech that is clear and easy to understand  Understanding Verbal and Non-Verbal Content Understanding Verbal and Non-Verbal Content: 4. Understands (complex and basic) - clear comprehension without cues or repetitions   Memory/Recall Ability Memory/Recall Ability : Current season;Location of own  room;That he or she is in a hospital/hospital unit   Refer to Care Plan for Long Term Goals  SHORT TERM GOAL WEEK 1 OT Short Term Goal 1 (Week 1): Pt will don LB clothing with Mod A OT Short Term Goal 2 (Week 1): Pt will complete 3/3 steps for toileting with Min A OT Short Term Goal 3 (Week 1): Pt will complete functional transafers with LRAD with CGA  Recommendations for other services: Neuropsych and Therapeutic Recreation  Pet therapy and Stress management   Skilled Therapeutic Intervention 1:1 evaluation and treatment session initiated this date. OT roles, goals and purpose discussed with pt as well as therapy schedule. ADL completed this date with levels of assist listed above. Pt completed functional mobility with RW with Min A for RW safety throughout session. Pt would benefit from skilled OT in IPR setting in order to maximize independence with ADLs upon D/C.   ADL ADL Equipment Provided: Reacher;Sock aid (Sock-aid from previous OT) Eating: Minimal assistance Where Assessed-Eating: Edge of bed Grooming: Supervision/safety Where Assessed-Grooming: Edge of bed Upper Body Bathing: Contact guard Where Assessed-Upper Body Bathing: Shower Lower Body Bathing: Minimal assistance Where Assessed-Lower Body Bathing: Shower Upper Body Dressing: Setup Where Assessed-Upper Body Dressing: Edge of bed Lower Body Dressing: Minimal assistance Where Assessed-Lower Body Dressing: Edge of bed Toileting: Minimal assistance Where Assessed-Toileting: Teacher, Adult Education: Curator Method: Proofreader: Acupuncturist: Insurance Underwriter Method: Designer, Industrial/product: Event Organiser  Bed Mobility Bed Mobility: Rolling Right;Rolling Left;Supine to [employer] Right: Contact Guard/Touching assist Rolling Left: Contact Guard/Touching assist Supine to Sit: Minimal Assistance  - Patient > 75% Transfers Sit to Stand: Minimal Assistance - Patient > 75% Stand to Sit: Minimal Assistance - Patient > 75%   Discharge Criteria: Patient will be discharged from OT if patient refuses treatment 3 consecutive times without medical reason, if treatment goals not met, if there is a change in medical status, if patient makes no progress towards goals or if patient is discharged from hospital.  The above assessment, treatment plan, treatment alternatives and goals were discussed and mutually agreed upon: by patient  Naimah Yingst Woods-Chance, MS, OTR/L 09/30/2024, 1:01 PM

## 2024-09-30 NOTE — Progress Notes (Signed)
 Occupational Therapy Session Note  Patient Details  Name: Alexander Roberts MRN: [id number] Date of Birth: 22-Nov-1985  Today's Date: 09/30/2024 OT Individual Time: 1301-1401 OT Individual Time Calculation (min): 60 min    Short Term Goals: Week 1:  OT Short Term Goal 1 (Week 1): Pt will don LB clothing with Mod A OT Short Term Goal 2 (Week 1): Pt will complete 3/3 steps for toileting with Min A OT Short Term Goal 3 (Week 1): Pt will complete functional transafers with LRAD with CGA  Skilled Therapeutic Interventions/Progress Updates:  Skilled OT session completed to address ADL retraining. Pt received supine in bed with wife present, agreeable to participate in therapy. Pt reports 7/10 pain in low back, premedicated prior to OT session, OT provided pain intervention with rest breaks and repositioning.   Pt requested to void, Supine>EOB CGA with increased time to reach EOB. Pt completed functional mobility with RW Min A for RW safety d/t poor LLE coordination. Pt completed toileting with Min A for hygiene to adhere to back precautions, OT providing VC for wife to assist. Pt requesting to shower, functional mobility to TTB with Min A d/t increased LBP, OT prompted for rest break. Pt displaying increased anxiety, VC for diaphragmatic breathing. Pt completed shower level ADL with CGA, standing intermittently during task. OT instructed on use of LH sponge for LB bathing and compensatory strategies for peri care. Pt donned LB clothing with Min A to weave in RLE, pt attempted to complete task with reacher; however, d/t pain task discontinued. Pt reporting nausea, functional mobility back to bed with RW CGA. Pt donned UB clothing with set-up A. EOB>Supine Min A to life LLE. OT provided pt binder to assist with education on CLOF and diagnoses from admission. Pt supine in bed with bed alarm on and all needs within reach.   Therapy Documentation Precautions:  Precautions Precautions: Fall, Back Recall of  Precautions/Restrictions: Intact Precaution/Restrictions Comments: Fall, spinal Restrictions Weight Bearing Restrictions Per Provider Order: No   Therapy/Group: Individual Therapy  Chung Chagoya Woods-Chance, MS, OTR/L 09/30/2024, 2:09 PM

## 2024-09-30 NOTE — Evaluation (Signed)
 Physical Therapy Assessment and Plan  Patient Details  Name: Alexander Roberts MRN: [id number] Date of Birth: 31-Mar-1986  PT Diagnosis: Abnormal posture, Abnormality of gait, Difficulty walking, Impaired sensation, Muscle weakness, and Pain in back Rehab Potential: Fair ELOS: 10-14 days   Today's Date: 09/30/2024 PT Individual Time: 0800-0913 PT Individual Time Calculation (min): 73 min    Hospital Problem: Principal Problem:   Discitis   Past Medical History:  Past Medical History:  Diagnosis Date   Medical history non-contributory    Past Surgical History:  Past Surgical History:  Procedure Laterality Date   EXCISION OF TONGUE LESION N/A 10/23/2019   Procedure: RESECTION OF THYROGLOSSAL REMNANT IN BASE OF TONGUE;  Surgeon: Jesus Oliphant, MD;  Location: [facility] OR;  Service: ENT;  Laterality: N/A;   LUMBAR LAMINECTOMY/DECOMPRESSION MICRODISCECTOMY N/A 09/24/2024   Procedure: LUMBAR LAMINECTOMY/DECOMPRESSION MICRODISCECTOMY 1 LEVEL;  Surgeon: Canyon Alm Hamilton, MD;  Location: [facility] OR;  Service: Neurosurgery;  Laterality: N/A;  Lumbar Three - Four, Lumbar Four - Five Laminectomy   NO PAST SURGERIES      Assessment & Plan Clinical Impression: Patient is a 39 y.o. year old male with unremarkable past medical history except chronic back pain with epidural injection 2 years ago and tobacco use as well as class III obesity BMI 40.64. Per chart review patient lives with spouse and 42 year old son. 1 level home 4 steps to entry. 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 Admitted 09/24/2024 after two recent ED visits with increasing back pain over the 2 weeks with weakness of bilateral lower extremity as well as reported numbness in his perineum and bilateral lower extremities. Denied any urinary or fecal incontinence. MRI initially was done 09/20/2024 as an outpatient without contrast suggestive of discitis and osteomyelitis. Admission  chemistries unremarkable except blood culture Streptococcus intermedius, sedimentation rate 55, C-reactive protein 40.9, glucose 110, chloride 96, WBC 13,500. Patient was initially placed on broad-spectrum antibiotics. Neurosurgery Dr. Alm Romulo as well as infectious disease Dr. Dennise consulted. 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 Findings were consistent with discitis and osteomyelitis L4-5 and L5-S1 with additional concern for discitis at L3-L4. Possible osteomyelitis in the posterior L3 vertebral body. Paraspinal and psoas muscle edema with multiple collections concerning for abscesses, including a partially visualized right paraspinal collection at L4-5 and left paraspinal/psoas collection at L5-S1. Severe spinal canal stenosis at L2-3, L3-4 and L4-5 with severe right and moderate to severe left foraminal stenosis at L5-S1. Patient underwent decompressive lumbar laminectomy, medial facetectomy foraminotomies L3-4 and L4-5 with evacuation of epidural abscess 09/24/2024 per Dr. Alm Prabhjot.NO BRACE NEEDED. Hospital course follow-up ID for Streptococcus intermedius bacteremia with latest blood cultures 1/27 showing no growth to date. 1/29 TTE with no vegetations. CT abdomen pelvis with contrast to rule out any intra-abdominal abscess due to high association with pyogenic infection with Streptococcus intermedius showed vague fat stranding along a focal distal sigmoid diverticulum representing resolving or developing acute diverticulitis however doubtful and continue to monitor. No focal soft tissue abnormality. Severe degenerative changes at the L4-L5 level. No acute osseous abnormality. Patient currently remains on IV ceftriaxone  awaiting full sensitivities follow-up per ID. Patient was cleared to begin Lovenox  for DVT prophylaxis  09/28/2024. Psychiatry services consulted 09/26/2024 for adjustment disorder patient with noted increased depression and tearfulness and was  started on duloxetine  titrated as needed as well as trazodone  and psychiatry services signed off 09/28/2024. Therapy evaluations completed due to patient decreased functional mobility was admitted for a comprehensive rehab program.    Patient currently requires min with mobility secondary to muscle weakness and muscle joint tightness, decreased cardiorespiratoy endurance, unbalanced muscle activation and decreased coordination, and decreased standing balance and decreased balance strategies.  Prior to hospitalization, patient was independent  with mobility and lived with Spouse, Son (wife is on caregiver leave for 6 weeks) in a House home.  Home access is 4Stairs to enter.  Patient will benefit from skilled PT intervention to maximize safe functional mobility, minimize fall risk, and decrease caregiver burden for planned discharge home with intermittent assist.  Anticipate patient will benefit from follow up [facility] at discharge.  PT - End of Session Activity Tolerance: Tolerates 30+ min activity with multiple rests Endurance Deficit: Yes Endurance Deficit Description: pt limited by significant pain dominance and fatiuge, only able to tolerate sitting OOB in standard WC and/or TIS for 5-10 minutes before needing to return to bed PT Assessment Rehab Potential (ACUTE/IP ONLY): Fair PT Barriers to Discharge: Inaccessible home environment;Decreased caregiver support;Home environment access/layout;Lack of/limited family support;Weight;Nutrition means PT Barriers to Discharge Comments: significant pain dominance, fatiuge, anxiety, depression PT Patient demonstrates impairments in the following area(s): Balance;Endurance;Motor;Pain;Safety;Sensory;Skin Integrity PT Transfers Functional Problem(s): Bed Mobility;Bed to Chair;Car;Furniture PT Locomotion Functional Problem(s):  Ambulation;Stairs;Wheelchair Mobility PT Plan PT Intensity: Minimum of 1-2 x/day ,45 to 90 minutes PT Frequency: 5 out of 7 days PT Duration Estimated Length of Stay: 2-3 weeks PT Treatment/Interventions: Ambulation/gait training;Disease management/prevention;Pain management;Stair training;Visual/perceptual remediation/compensation;Balance/vestibular training;DME/adaptive equipment instruction;Patient/family education;Therapeutic Activities;Wheelchair propulsion/positioning;Cognitive remediation/compensation;Functional electrical stimulation;Psychosocial support;Therapeutic Exercise;Community reintegration;Functional mobility training;Skin care/wound management;UE/LE Strength taining/ROM;Discharge planning;Neuromuscular re-education;Splinting/orthotics;UE/LE Coordination activities PT Transfers Anticipated Outcome(s): supervision PT Locomotion Anticipated Outcome(s): supervision PT Recommendation Recommendations for Other Services: Neuropsych consult;Therapeutic Recreation consult Therapeutic Recreation Interventions: Pet therapy;Stress management Follow Up Recommendations: Home health PT Patient destination: Home Equipment Recommended: To be determined Equipment Details: pt has RW   PT Evaluation Precautions/Restrictions Precautions Precautions: Fall;Back Recall of Precautions/Restrictions: Intact Precaution/Restrictions Comments: Fall, spinal Restrictions Weight Bearing Restrictions Per Provider Order: No Pain Interference Pain Interference Pain Effect on Sleep: 4. Almost constantly Pain Interference with Therapy Activities: 1. Rarely or not at all Pain Interference with Day-to-Day Activities: 4. Almost constantly Home Living/Prior Functioning Home Living Available Help at Discharge: Family;Available 24 hours/day Type of Home: House Home Access: Stairs to enter [employer] of Steps: 4 Entrance Stairs-Rails: Can reach both Home Layout: One level Bathroom Shower/Tub:  [employer]er[employer] Systems: Standard Bathroom Accessibility: Yes  Lives With: Spouse;Son (wife is on caregiver leave for 6 weeks) Prior Function Level of Independence: Independent with basic ADLs;Independent with gait;Independent with homemaking with ambulation;Independent with transfers  Able to Take Stairs?: Yes Driving: Yes Vocation: Full time employment 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, stocking shelves) Vision/Perception  Vision - History Ability to See in Adequate Light: 0 Adequate Perception Perception: Within Functional Limits Praxis Praxis: WFL  Cognition Overall Cognitive Status: Impaired/Different from baseline Arousal/Alertness: Awake/alert Orientation Level: Oriented X4 Year: 2026 Month: January Day of Week: Incorrect (thurday or friday) Memory: Appears intact Awareness: Appears intact Problem Solving: Appears intact Safety/Judgment: Appears intact Sensation Sensation Light Touch: Impaired by gross assessment Proprioception: Impaired by gross assessment Additional Comments: Pt endorses N&T B LE - pt able to detect light touch in all dermatomes, Pt endorses light touch diminished in B LE below the knee (more  diminished on L LE in comparison to R LLE). Pt endorses N&T in buttocks and groin Coordination Gross Motor Movements are Fluid and Coordinated: No Coordination and Movement Description: limited by pain and weakness in B LE, and cramping in B UE Heel Shin Test: WFL on R LE, unable to complete on L LE 2/2 pain and weakness Motor  Motor Motor: Abnormal postural alignment and control  Trunk/Postural Assessment  Cervical Assessment Cervical Assessment: Within Functional Limits Thoracic Assessment Thoracic Assessment: Within Functional Limits Lumbar Assessment Lumbar Assessment: Exceptions to [facility] (posterior pelvic tilt) Postural Control Postural Control: Deficits on evaluation Postural Limitations: overall very guarded and  dependent on B UE for trunk control and pain management  Balance Balance Balance Assessed: Yes Static Sitting Balance Static Sitting - Balance Support: Feet supported;Bilateral upper extremity supported Static Sitting - Level of Assistance: 5: Stand by assistance (CGA) Dynamic Sitting Balance Dynamic Sitting - Balance Support: During functional activity;Bilateral upper extremity supported Dynamic Sitting - Level of Assistance: 4: Min assist Sitting balance - Comments: Reliant on BUE support due to pain Static Standing Balance Static Standing - Balance Support: Bilateral upper extremity supported;During functional activity Static Standing - Level of Assistance: 5: Stand by assistance Dynamic Standing Balance Dynamic Standing - Balance Support: Bilateral upper extremity supported;During functional activity Dynamic Standing - Level of Assistance: 4: Min assist Extremity Assessment  RLE Assessment RLE Assessment: Exceptions to [facility] General Strength Comments: grossly 3/5 - unable to tolerate resistance 2/2 LBP LLE Assessment LLE Assessment: Exceptions to [facility] General Strength Comments: grossly 2/5 - limited by pain and weakness  Care Tool Care Tool Bed Mobility Roll left and right activity   Roll left and right assist level: Contact Guard/Touching assist    Sit to lying activity   Sit to lying assist level: Contact Guard/Touching assist    Lying to sitting on side of bed activity   Lying to sitting on side of bed assist level: the ability to move from lying on the back to sitting on the side of the bed with no back support.: Minimal Assistance - Patient > 75%     Care Tool Transfers Sit to stand transfer   Sit to stand assist level: Minimal Assistance - Patient > 75%    Chair/bed transfer   Chair/bed transfer assist level: Minimal Assistance - Patient > 75%    Car transfer Car transfer activity did not occur: Safety/medical concerns (pain/fatigue)        Care Tool  Locomotion Ambulation Ambulation activity did not occur: Safety/medical concerns        Walk 10 feet activity Walk 10 feet activity did not occur: Safety/medical concerns       Walk 50 feet with 2 turns activity Walk 50 feet with 2 turns activity did not occur: Safety/medical concerns      Walk 150 feet activity Walk 150 feet activity did not occur: Safety/medical concerns      Walk 10 feet on uneven surfaces activity Walk 10 feet on uneven surfaces activity did not occur: Safety/medical concerns      Stairs Stair activity did not occur: Safety/medical concerns        Walk up/down 1 step activity Walk up/down 1 step or curb (drop down) activity did not occur: Safety/medical concerns      Walk up/down 4 steps activity Walk up/down 4 steps activity did not occur: Safety/medical concerns      Walk up/down 12 steps activity Walk up/down 12 steps activity did not occur: Safety/medical concerns  Pick up small objects from floor   Pick up small object from the floor assist level: Total Assistance - Patient < 25%    Wheelchair Is the patient using a wheelchair?: Yes Type of Wheelchair: Manual   Wheelchair assist level: Dependent - Patient 0%    Wheel 50 feet with 2 turns activity   Assist Level: Dependent - Patient 0%  Wheel 150 feet activity   Assist Level: Dependent - Patient 0%    Refer to Care Plan for Long Term Goals  SHORT TERM GOAL WEEK 1 PT Short Term Goal 1 (Week 1): pt will tolerate sitting OOB in TIS WC for at least 2 hours at a time PT Short Term Goal 2 (Week 1): pt will ambulate 100 feet with LRAD and min A PT Short Term Goal 3 (Week 1): pt will perform dynamic seated balance with LRAD and CGA PT Short Term Goal 4 (Week 1): pt will perform bed to chair transfer with LRAD and CGA  Recommendations for other services: Neuropsych and Therapeutic Recreation  Pet therapy and Stress management  Skilled Therapeutic Intervention Mobility Bed Mobility Bed  Mobility: Rolling Right;Rolling Left;Supine to Sit Rolling Right: Contact Guard/Touching assist Rolling Left: Contact Guard/Touching assist Supine to Sit: Minimal Assistance - Patient > 75% Transfers Transfers: Sit to Stand;Stand to Sit;Stand Pivot Transfers Sit to Stand: Minimal Assistance - Patient > 75% Stand to Sit: Minimal Assistance - Patient > 75% Stand Pivot Transfers: Minimal Assistance - Patient > 75% Stand Pivot Transfer Details: Verbal cues for gait pattern;Verbal cues for precautions/safety Transfer (Assistive device): Rolling walker Locomotion  Gait Ambulation: No (pain limiting pt) Gait Gait: No Stairs / Additional Locomotion Stairs: No Wheelchair Mobility Wheelchair Mobility: Yes Wheelchair Assistance: Dependent - Patient 0% Wheelchair Parts Management: Needs assistance   Discharge Criteria: Patient will be discharged from PT if patient refuses treatment 3 consecutive times without medical reason, if treatment goals not met, if there is a change in medical status, if patient makes no progress towards goals or if patient is discharged from hospital.  The above assessment, treatment plan, treatment alternatives and goals were discussed and mutually agreed upon: by patient and by family  Today's Interventions  Evaluation completed (see details above and below) with education on PT POC and goals and individual treatment initiated with focus on transfer training.   Pt supine in bed upon arrival. Pt agreeable to therapy. Pt endorsing 8/10 LBP, requesting pain medicine, notified nurse. Nurse present to administer medications.   Pt heavily pain dominant throughout session. Pt heavily dependent on BUE support while seated EOB and standing with RW for LBP management. Pt endorses having spasms in B UE - increased with fatigue and increased pain throughout session. Notified covering MD via secure chat.  Pt had 18x20 WC in room, pt able to tolerate sitting in it for ~5 min  prior to requesting to return to bed. PT provided pt with TIS WC. Pt endorses some pain relief with tilted position but unable to tolerate sitting between sessions.   Pt performed stand pivot transfer bed to standard WC, standard WC to TIS, TIS to bed with RW and min A, verbal cues provided for UE positioning. Pt overall very guarded with significant posterior pelvic tilt.   Bed mobility mod A overall.   Pt supine in bed with all needs within reach and bed alarm on.    [facility] [place], [place], DPT  09/30/2024, 9:14 AM

## 2024-10-01 DIAGNOSIS — M4640 Discitis, unspecified, site unspecified: Secondary | ICD-10-CM | POA: Diagnosis not present

## 2024-10-01 LAB — CULTURE, BLOOD (ROUTINE X 2)
Culture: NO GROWTH
Culture: NO GROWTH
Special Requests: ADEQUATE
Special Requests: ADEQUATE

## 2024-10-01 MED ORDER — MAGNESIUM OXIDE -MG SUPPLEMENT 400 (240 MG) MG PO TABS
400.0000 mg | ORAL_TABLET | Freq: Every day | ORAL | Status: AC
  Administered 2024-10-02 – 2024-10-06 (×5): 400 mg via ORAL
  Filled 2024-10-01 (×5): qty 1

## 2024-10-01 MED ORDER — MELATONIN 3 MG PO TABS
3.0000 mg | ORAL_TABLET | Freq: Every day | ORAL | Status: AC
  Administered 2024-10-01 – 2024-10-06 (×6): 3 mg via ORAL
  Filled 2024-10-01 (×6): qty 1

## 2024-10-01 MED ORDER — HYDROMORPHONE HCL 2 MG PO TABS
1.0000 mg | ORAL_TABLET | ORAL | Status: DC | PRN
  Administered 2024-10-01: 1 mg via ORAL
  Filled 2024-10-01: qty 1

## 2024-10-01 MED ORDER — CHLORHEXIDINE GLUCONATE CLOTH 2 % EX PADS
6.0000 | MEDICATED_PAD | Freq: Two times a day (BID) | CUTANEOUS | Status: AC
  Administered 2024-10-02 – 2024-10-06 (×8): 6 via TOPICAL

## 2024-10-01 NOTE — Progress Notes (Incomplete)
 Occupational Therapy Session Note  Patient Details  Name: Alexander Roberts MRN: [id number] Date of Birth: Nov 30, 1985  Today's Date: 10/02/2024 OT Individual Time: 9053-8955 OT Individual Time Calculation (min): 58 min    Short Term Goals: Week 1:  OT Short Term Goal 1 (Week 1): Pt will don LB clothing with Mod A OT Short Term Goal 2 (Week 1): Pt will complete 3/3 steps for toileting with Min A OT Short Term Goal 3 (Week 1): Pt will complete functional transafers with LRAD with CGA  Skilled Therapeutic Interventions/Progress Updates:  Pt greeted supine in bed, with HOB at 25* pt agreeable to OT intervention.    Focused beginning of session on building rapport and tolerating elevating HOB, pt able to tolerate 32*.   Transfers/bed mobility/functional mobility:  Pt completed supine>sit via log roll technique with CGA and use of bed rails. Pt completed sit>stand from EOB with RW and CGA, pt immediately reports 8/10 pain in low back needing to return to sitting. Pt needed MODA to return to supine needing assist to elevate RLE.  Pt needed to return to flat HOB at 0*.   Pt was able to tolerate laying on his side with wedge placed in sidelying.    Education:  Education provided on LB AE for bathing/dressing. Pt most interested on bidet for home, provided [facility] link. Pt able to state 3/3 back precautions but needed min cueing to maintain in bed when trying to get comfortable.                  Ended session with pt in sidelying in bed with all needs within reach and bed alarm activated.                   Therapy Documentation Precautions:  Precautions Precautions: Fall, Back Recall of Precautions/Restrictions: Intact Precaution/Restrictions Comments: Fall, spinal Restrictions Weight Bearing Restrictions Per Provider Order: No  Pain:8/10 pain reported in back, rest breaks provided as needed, pt requested pain meds from nurse at end of session.     Therapy/Group: Individual  Therapy  Ronal Gift [facility] 10/02/2024, 11:51 AM

## 2024-10-02 DIAGNOSIS — M4626 Osteomyelitis of vertebra, lumbar region: Secondary | ICD-10-CM | POA: Diagnosis not present

## 2024-10-02 LAB — CBC WITH DIFFERENTIAL/PLATELET
Abs Immature Granulocytes: 0.11 10*3/uL — ABNORMAL HIGH (ref 0.00–0.07)
Basophils Absolute: 0.1 10*3/uL (ref 0.0–0.1)
Basophils Relative: 1 %
Eosinophils Absolute: 0.1 10*3/uL (ref 0.0–0.5)
Eosinophils Relative: 2 %
HCT: 36.5 % — ABNORMAL LOW (ref 39.0–52.0)
Hemoglobin: 12.4 g/dL — ABNORMAL LOW (ref 13.0–17.0)
Immature Granulocytes: 2 %
Lymphocytes Relative: 26 %
Lymphs Abs: 1.6 10*3/uL (ref 0.7–4.0)
MCH: 28 pg (ref 26.0–34.0)
MCHC: 34 g/dL (ref 30.0–36.0)
MCV: 82.4 fL (ref 80.0–100.0)
Monocytes Absolute: 0.9 10*3/uL (ref 0.1–1.0)
Monocytes Relative: 14 %
Neutro Abs: 3.5 10*3/uL (ref 1.7–7.7)
Neutrophils Relative %: 55 %
Platelets: 594 10*3/uL — ABNORMAL HIGH (ref 150–400)
RBC: 4.43 MIL/uL (ref 4.22–5.81)
RDW: 13.4 % (ref 11.5–15.5)
WBC: 6.3 10*3/uL (ref 4.0–10.5)
nRBC: 0 % (ref 0.0–0.2)

## 2024-10-02 MED ORDER — HYDROMORPHONE HCL 2 MG PO TABS
4.0000 mg | ORAL_TABLET | ORAL | Status: AC | PRN
  Administered 2024-10-02 – 2024-10-06 (×21): 4 mg via ORAL
  Filled 2024-10-02 (×21): qty 2

## 2024-10-02 MED ORDER — TIZANIDINE HCL 2 MG PO TABS
4.0000 mg | ORAL_TABLET | Freq: Three times a day (TID) | ORAL | Status: AC
  Administered 2024-10-02 – 2024-10-06 (×14): 4 mg via ORAL
  Filled 2024-10-02 (×14): qty 2

## 2024-10-02 NOTE — Progress Notes (Signed)
 Patient ID: Alexander Roberts, male   DOB: 09/13/1985, 39 y.o.   MRN: [id number] Have faxed the FMLA/STD forms to [employer] for pt and will also email to make sure they are received. Will give pt the originals back for his file

## 2024-10-02 NOTE — Progress Notes (Signed)
 Inpatient Rehabilitation  Patient information reviewed and entered into eRehab system by [facility]. Karen Kays., CCC/SLP, PPS Coordinator.  Information including medical coding, functional ability and quality indicators will be reviewed and updated through discharge.

## 2024-10-02 NOTE — Progress Notes (Signed)
 Physical Therapy Session Note  Patient Details  Name: Alexander Roberts MRN: [id number] Date of Birth: 28-Jul-1986  Today's Date: 10/02/2024 PT Individual Time: 0800-0828, 9068-9055, 8699-8586 PT Individual Time Calculation (min): 28 min, 13 min, 73 min and Today's Date: 10/02/2024 PT Missed Time: 32 Minutes Missed Time Reason: Pain;Patient fatigue  Short Term Goals: Week 1:  PT Short Term Goal 1 (Week 1): pt will tolerate sitting OOB in TIS WC for at least 2 hours at a time PT Short Term Goal 2 (Week 1): pt will ambulate 100 feet with LRAD and min A PT Short Term Goal 3 (Week 1): pt will perform dynamic seated balance with LRAD and CGA PT Short Term Goal 4 (Week 1): pt will perform bed to chair transfer with LRAD and CGA  Skilled Therapeutic Interventions/Progress Updates:      Treatment Session 1   Pt supine in bed upon arrival. Pt endorses increased fatigue 2/2 lack of sleep 2/2 multiple disturbances throughout the night. Pt agitated regarding distrurbances throughout the night. Nurse present at start of session to administer medications. Pt endorsing 10/10 pain.   Pt severly pain dominant throughout session and emotionally labile screaming and crying with bed mobility and stand pivot transfer to TIS WC. PT notifed scheduled to request therpay sessions begin no earlier than 9 am to allow pain medication to set in. Notified MD of pt pain dominance. PT readjusted pt schedule to return at later time for improved pain management and hopefully improved tolerance with therapy.   Supine to sit with log roll technique and CGA with increased time. Pt required total A to donn shoes while seated EOB, pt heavily dependent on B UE for seated balance and pain management. Sit to stand from elevated bed with min A. Stand pivot transfer with min A pt screaming/crying in pain.   Sit to supine with min A for management of L LE.   Pt supine in bed with all needs within reach and bed alarm on.   Treatment  Session 2   PT returned at 931. Pt supine in bed attempting to eat breakfast while laying sidelying in bed with bed flat. Pt endorsing need to eat 2/2 nausea but difficulty tolerating sitting up in bed 2/2 pain. PT cut up pt food for pt for improved ease and accessibility.   PT provided pt with TIS WC with elevating leg rests for hopeful improved comfort and pain management as pt endorses difficulty sitting in 90-90 position. Education provided to pt regarding importance of progressively raising HOB as tolerated for improved upright tolerance. Pt verbalized understanding and agreeable and endorses improved pain management with medication administration. Pt able to tolerated bed raised to ~30 deg.   Pt with OT at end of session.   Treatment Session 3  Pt in L sidelying upon arrival. Wedge delivered to room however pt not using. Pt agreeable to therapy. Pt endorses 6/10 LBP. PT provided rest breaks and repositioning as needed.   Education provided for pressure relief (technique and frequency) for skin integrity and to reduce risk of pressure sores. Emphasized improtance of doing this 2/2 decreased sensation in buttocks. Pt verbalized understanding and agreebable and endorses he rolls onto his side regularly.   Pt performed the following bed level therex for B LE strengthening, activity tolernace:   1x10 PPT with pt in hooklying position (pt assistint to hold L LE in this position).   1x10 glute sets B 5 hold  1x30 ankle pumps B   1x10 AA  supine hip abduction/adduction with use of gait belt LLE  1x10 AA heel slides with use of gait belt LLE  1x10 AA L LE SLR (with therapist provide AA)  1x10 AA single knee to chest    1x15 B LE hooklying knee fall outs (pt stabilizing L LE) 2x5 bridge - pt demonstrates minimal to no clearance but demonstrates increased activation with each rep   1x15 sidelying clamshells B LE  1x10 AA sidelying hip abduction B LE  1x15 AAsidelying hip extension B LE  Pt  attempted to sit EOB. Pt ultimately resorting to laying back down 2/2 increased/unbearable LBP.   Pt supine in bed at end of session with all needs within reach and bed alarm on.     Therapy Documentation Precautions:  Precautions Precautions: Fall, Back Recall of Precautions/Restrictions: Intact Precaution/Restrictions Comments: Fall, spinal Restrictions Weight Bearing Restrictions Per Provider Order: No   Therapy/Group: Individual Therapy  [facility] Doreene Orris, [place], DPT  10/02/2024, 7:40 AM

## 2024-10-02 NOTE — Progress Notes (Signed)
 Met with patient and wife to review plan of care, team conference and plan of care. Reviewed medications, pain , skin integrity, incision care, bowel and bladder.  Patient has steris on incision site CDI. Patient claims he was not able to sleep on acute care due to the bed being uncomfortable Claims air mattress  is working and is great on his back. Continue to follow along to provide educational needs to facilitate preparation with discharge.

## 2024-10-02 NOTE — Plan of Care (Signed)
" °  Problem: Consults Goal: RH GENERAL PATIENT EDUCATION Description: See Patient Education module for education specifics. Outcome: Progressing   Problem: RH BOWEL ELIMINATION Goal: RH STG MANAGE BOWEL WITH ASSISTANCE Description: STG Manage Bowel with supervision - mod I  Assistance. Outcome: Progressing   Problem: RH BLADDER ELIMINATION Goal: RH STG MANAGE BLADDER WITH ASSISTANCE Description: STG Manage Bladder With supervision- mod I Assistance Outcome: Progressing   Problem: RH SKIN INTEGRITY Goal: RH STG SKIN FREE OF INFECTION/BREAKDOWN Description: Manage skin with supervision- mod I assistance Outcome: Progressing   Problem: RH SAFETY Goal: RH STG ADHERE TO SAFETY PRECAUTIONS W/ASSISTANCE/DEVICE Description: STG Adhere to Safety Precautions With supervision- mod I Assistance/Device. Outcome: Progressing   Problem: RH PAIN MANAGEMENT Goal: RH STG PAIN MANAGED AT OR BELOW PT'S PAIN GOAL Description: <4 w/ prns Outcome: Progressing   Problem: RH KNOWLEDGE DEFICIT GENERAL Goal: RH STG INCREASE KNOWLEDGE OF SELF CARE AFTER HOSPITALIZATION Description: Manage increase knowledge of self care after hospitalization with supervision- mod I assistance from wife using educational materials provided Outcome: Progressing   "

## 2024-10-03 DIAGNOSIS — M4626 Osteomyelitis of vertebra, lumbar region: Secondary | ICD-10-CM | POA: Diagnosis not present

## 2024-10-03 MED ORDER — OXYCODONE HCL ER 10 MG PO T12A
10.0000 mg | EXTENDED_RELEASE_TABLET | Freq: Once | ORAL | Status: AC
  Administered 2024-10-03: 10 mg via ORAL
  Filled 2024-10-03: qty 1

## 2024-10-03 MED ORDER — OXYCODONE HCL ER 15 MG PO T12A
30.0000 mg | EXTENDED_RELEASE_TABLET | Freq: Two times a day (BID) | ORAL | Status: DC
  Administered 2024-10-03 – 2024-10-06 (×6): 30 mg via ORAL
  Filled 2024-10-03 (×6): qty 2

## 2024-10-03 MED ORDER — LIDOCAINE 5 % EX PTCH
2.0000 | MEDICATED_PATCH | Freq: Once | CUTANEOUS | Status: AC
  Administered 2024-10-03: 2 via TRANSDERMAL
  Filled 2024-10-03: qty 2

## 2024-10-03 MED ORDER — DIAZEPAM 5 MG/ML IJ SOLN
5.0000 mg | Freq: Once | INTRAMUSCULAR | Status: AC
  Administered 2024-10-03: 5 mg via INTRAVENOUS
  Filled 2024-10-03: qty 2

## 2024-10-03 MED ORDER — LIDOCAINE 5 % EX PTCH
2.0000 | MEDICATED_PATCH | CUTANEOUS | Status: AC
  Administered 2024-10-04 – 2024-10-06 (×3): 2 via TRANSDERMAL
  Filled 2024-10-03 (×3): qty 2

## 2024-10-03 MED ORDER — DIAZEPAM 5 MG PO TABS
5.0000 mg | ORAL_TABLET | Freq: Four times a day (QID) | ORAL | Status: DC | PRN
  Administered 2024-10-03 – 2024-10-05 (×7): 5 mg via ORAL
  Filled 2024-10-03 (×7): qty 1

## 2024-10-03 MED ORDER — HYDROMORPHONE HCL 1 MG/ML IJ SOLN: 2.0000 mg | INTRAMUSCULAR | Status: AC | PRN

## 2024-10-03 NOTE — Plan of Care (Signed)
" °  Problem: RH Balance Goal: LTG: Patient will maintain dynamic sitting balance (OT) Description: LTG:  Patient will maintain dynamic sitting balance with assistance during activities of daily living (OT) Flowsheets (Taken 10/03/2024 0800) LTG: Pt will maintain dynamic sitting balance during ADLs with: Independent with assistive device Goal: LTG Patient will maintain dynamic standing with ADLs (OT) Description: LTG:  Patient will maintain dynamic standing balance with assist during activities of daily living (OT)  Flowsheets (Taken 10/03/2024 0800) LTG: Pt will maintain dynamic standing balance during ADLs with: Independent with assistive device   Problem: RH Eating Goal: LTG Patient will perform eating w/assist, cues/equip (OT) Description: LTG: Patient will perform eating with assist, with/without cues using equipment (OT) Flowsheets (Taken 10/03/2024 0800) LTG: Pt will perform eating with assistance level of: Independent with assistive device    Problem: RH Grooming Goal: LTG Patient will perform grooming w/assist,cues/equip (OT) Description: LTG: Patient will perform grooming with assist, with/without cues using equipment (OT) Flowsheets (Taken 10/03/2024 0800) LTG: Pt will perform grooming with assistance level of: Independent with assistive device    Problem: RH Bathing Goal: LTG Patient will bathe all body parts with assist levels (OT) Description: LTG: Patient will bathe all body parts with assist levels (OT) Flowsheets (Taken 10/03/2024 0800) LTG: Pt will perform bathing with assistance level/cueing: Independent with assistive device    Problem: RH Dressing Goal: LTG Patient will perform upper body dressing (OT) Description: LTG Patient will perform upper body dressing with assist, with/without cues (OT). Flowsheets (Taken 10/03/2024 0800) LTG: Pt will perform upper body dressing with assistance level of: Independent with assistive device Goal: LTG Patient will perform lower body  dressing w/assist (OT) Description: LTG: Patient will perform lower body dressing with assist, with/without cues in positioning using equipment (OT) Flowsheets (Taken 10/03/2024 0800) LTG: Pt will perform lower body dressing with assistance level of: Independent with assistive device   Problem: RH Toileting Goal: LTG Patient will perform toileting task (3/3 steps) with assistance level (OT) Description: LTG: Patient will perform toileting task (3/3 steps) with assistance level (OT)  Flowsheets (Taken 10/03/2024 0800) LTG: Pt will perform toileting task (3/3 steps) with assistance level: Independent with assistive device   Problem: RH Toilet Transfers Goal: LTG Patient will perform toilet transfers w/assist (OT) Description: LTG: Patient will perform toilet transfers with assist, with/without cues using equipment (OT) Flowsheets (Taken 10/03/2024 0800) LTG: Pt will perform toilet transfers with assistance level of: Independent with assistive device   Problem: RH Tub/Shower Transfers Goal: LTG Patient will perform tub/shower transfers w/assist (OT) Description: LTG: Patient will perform tub/shower transfers with assist, with/without cues using equipment (OT) Flowsheets (Taken 10/03/2024 0800) LTG: Pt will perform tub/shower stall transfers with assistance level of: Independent with assistive device   "

## 2024-10-03 NOTE — Plan of Care (Signed)
" °  Problem: Consults Goal: RH GENERAL PATIENT EDUCATION Description: See Patient Education module for education specifics. Outcome: Progressing   Problem: RH BOWEL ELIMINATION Goal: RH STG MANAGE BOWEL WITH ASSISTANCE Description: STG Manage Bowel with supervision - mod I  Assistance. Outcome: Progressing   Problem: RH BLADDER ELIMINATION Goal: RH STG MANAGE BLADDER WITH ASSISTANCE Description: STG Manage Bladder With supervision- mod I Assistance Outcome: Progressing   Problem: RH SKIN INTEGRITY Goal: RH STG SKIN FREE OF INFECTION/BREAKDOWN Description: Manage skin with supervision- mod I assistance Outcome: Progressing   Problem: RH SAFETY Goal: RH STG ADHERE TO SAFETY PRECAUTIONS W/ASSISTANCE/DEVICE Description: STG Adhere to Safety Precautions With supervision- mod I Assistance/Device. Outcome: Progressing   Problem: RH PAIN MANAGEMENT Goal: RH STG PAIN MANAGED AT OR BELOW PT'S PAIN GOAL Description: <4 w/ prns Outcome: Progressing   Problem: RH KNOWLEDGE DEFICIT GENERAL Goal: RH STG INCREASE KNOWLEDGE OF SELF CARE AFTER HOSPITALIZATION Description: Manage increase knowledge of self care after hospitalization with supervision- mod I assistance from wife using educational materials provided Outcome: Progressing   "

## 2024-10-03 NOTE — Progress Notes (Signed)
 Physical Therapy Session Note  Patient Details  Name: Alexander Roberts MRN: [id number] Date of Birth: 05/08/1986  Today's Date: 10/03/2024 PT Individual Time: 1032-1047 PT Individual Time Calculation (min): 15 min  and Today's Date: 10/03/2024 PT Missed Time: 45 Minutes Missed Time Reason: Pain;Patient fatigue  Short Term Goals: Week 1:  PT Short Term Goal 1 (Week 1): pt will tolerate sitting OOB in TIS WC for at least 2 hours at a time PT Short Term Goal 2 (Week 1): pt will ambulate 100 feet with LRAD and min A PT Short Term Goal 3 (Week 1): pt will perform dynamic seated balance with LRAD and CGA PT Short Term Goal 4 (Week 1): pt will perform bed to chair transfer with LRAD and CGA  Skilled Therapeutic Interventions/Progress Updates:      Pt in R side lying upon arrival. Pt endorsing horrific pain after OT session this AM - increased with pursed lip breathing. Pt emotionally labile. PT utilized therapeutic use of self to offer support and encouragement with emphasis on pt progress with bed level therex yesterday and activity he did with OT this morning. Pt endorses he was in really good spirits this morning as he was able to activate L LE more while supine in bed and able to sit upright in bed.   PA present to adjust pain medication adjustments. PT requested KREG bed to allow increased positioning and upright tolerance. MD ordered. PT requested scheduler increase gaps between session for pain management.   PT missed 45 min 2/2 pain and fatigue. Will attempt to make up missed minutes as able.   Therapy Documentation Precautions:  Precautions Precautions: Fall, Back Recall of Precautions/Restrictions: Intact Precaution/Restrictions Comments: Fall, spinal Restrictions Weight Bearing Restrictions Per Provider Order: No  Therapy/Group: Individual Therapy  [facility] [place], [place], DPT  10/03/2024, 7:45 AM

## 2024-10-03 NOTE — Progress Notes (Signed)
 Patient's nurse asked for PICC line shower cover. Measured patient's bicep and it was 15 and 1/2 inches. Our shower cover is fine to apply. Patient has cramps on the legs and today is not day for teaching patient and nurse how to use it. Patient stated that he is not taking a shower today or tomorrow. Informed patient's nurse that put on the consult when patient is ready for shower. HS [employer]

## 2024-10-04 DIAGNOSIS — M4626 Osteomyelitis of vertebra, lumbar region: Secondary | ICD-10-CM | POA: Diagnosis not present

## 2024-10-04 DIAGNOSIS — G834 Cauda equina syndrome: Secondary | ICD-10-CM | POA: Diagnosis present

## 2024-10-04 DIAGNOSIS — R6889 Other general symptoms and signs: Secondary | ICD-10-CM

## 2024-10-04 LAB — SUSCEPTIBILITY RESULT

## 2024-10-04 LAB — SUSCEPTIBILITY, AER + ANAEROB

## 2024-10-04 MED ORDER — SORBITOL 70 % SOLN
30.0000 mL | Freq: Once | Status: AC
  Filled 2024-10-04: qty 30

## 2024-10-04 NOTE — Progress Notes (Signed)
 Patient ID: Alexander Roberts, male   DOB: October 31, 1985, 39 y.o.   MRN: [id number] Re-submintted paperwork with changed start date of the 09/14/2024. Gave paperwork back to pt. Have found home health agency to accept pt [facility] will do the IV antibiotics and [facility] will provide home health therapies. Pt is aware of this

## 2024-10-04 NOTE — Progress Notes (Signed)
 Physical Therapy Session Note  Patient Details  Name: Alexander Roberts MRN: [id number] Date of Birth: 07/20/1986  Today's Date: 10/04/2024 PT Individual Time: [phone], [phone] PT Individual Time Calculation (min): 57 min, 43 min  Short Term Goals: Week 1:  PT Short Term Goal 1 (Week 1): pt will tolerate sitting OOB in TIS WC for at least 2 hours at a time PT Short Term Goal 2 (Week 1): pt will ambulate 100 feet with LRAD and min A PT Short Term Goal 3 (Week 1): pt will perform dynamic seated balance with LRAD and CGA PT Short Term Goal 4 (Week 1): pt will perform bed to chair transfer with LRAD and CGA  Skilled Therapeutic Interventions/Progress Updates:       Treatment Session 1   Pt supine in bed upon arrival. Pt agreeable to therapy. Pt endorses 5/10 pain, premedicated.  Pt endorses poor appetitie and only eating some of his breakfast - pt endorses improved upright tolerance and positioning to eat food. . PT offered ensure protein shake. Pt enjoys this. Pt requesting to keep session at bed level for pain management. Pt endorses fear of getting up OOB today 2/2 pain and spasms yesterday and nearly passing out. Pt endorsing intentionally not drinking his miralax  as it will induce BM and he is afraid to walk to bathroom. Education provided on improtance of having a BM as it has been 4 days since last BM. Education provided regarding alternative options (bed pan, BSC) - pt resistant to these options but agreeable to drink miralax  with education and encouragement throughout session. Upon further education and encouragement, pt reluctantly agreeable to use of BSC today versus toilet for pt safety 2/2 pt endorsing feeling as though he was going to pass out yesterday. Notified nurse, and OT.   Pt performed bed level therex for B LE strengthening/coordination/pain management/activity tolerance:   1x30 ankle pumps   1x20 glute sets 5 hold   2x15 R LE heel slides, 2x10 L LE heel slides - actively  on first trail, AA on 2nd trail  1x30 hookying knee fallouts B  2x10 glute bridge (minimal clearance but activating B)   Pt supine in bed with all needs within reach and bed alarm on.    Treatment Session 2  Pt supine in bed upon arrival. Pt agreeable to therapy. Pt endorses 6/10 pain. Pt endorsing being able walk to bathroom with RW and having a BM during OT session. Pt endorses increased spasticity- requesting to do session at bed level.   Recreational therapist present during session. Pt overall very pleased with all he has accomplished today. PT utilized therapeutic use of self to offer support and encouragement.   Worked on building pt upright tolerance (progressively as tolerated by pt) while seated in bed. Pt performed seated ball toss x20, progressed to ball toss while hitting it with 3# dowel rod. 2x15 chest press with 3# dowel rod.   PT supine in bed at end of session with all needs wtihin reach and bed alarm on.       Therapy Documentation Precautions:  Precautions Precautions: Fall, Back Recall of Precautions/Restrictions: Intact Precaution/Restrictions Comments: Fall, spinal Restrictions Weight Bearing Restrictions Per Provider Order: No   Therapy/Group: Individual Therapy  [facility] Doreene Orris, [place], DPT  10/04/2024, 7:33 AM

## 2024-10-04 NOTE — Evaluation (Signed)
 Recreational Therapy Assessment and Plan  Patient Details  Name: Alexander Roberts MRN: [id number] Date of Birth: [birth year] Today's Date: 10/04/2024  Rehab Potential: Good ELOS: 3 weeks   Assessment Hospital Problem: Principal Problem:   Discitis     Past Medical History:      Past Medical History:  Diagnosis Date   Medical history non-contributory          Past Surgical History:       Past Surgical History:  Procedure Laterality Date   EXCISION OF TONGUE LESION N/A 10/23/2019    Procedure: RESECTION OF THYROGLOSSAL REMNANT IN BASE OF TONGUE;  Surgeon: Jesus Oliphant, MD;  Location: [facility] OR;  Service: ENT;  Laterality: N/A;   LUMBAR LAMINECTOMY/DECOMPRESSION MICRODISCECTOMY N/A 09/24/2024    Procedure: LUMBAR LAMINECTOMY/DECOMPRESSION MICRODISCECTOMY 1 LEVEL;  Surgeon: Rufus Alm Hamilton, MD;  Location: [facility] OR;  Service: Neurosurgery;  Laterality: N/A;  Lumbar Three - Four, Lumbar Four - Five Laminectomy   NO PAST SURGERIES              Assessment & Plan Clinical Impression: Patient is a 39 y.o. year old male with unremarkable past medical history except chronic back pain with epidural injection 2 years ago and tobacco use as well as class III obesity BMI 40.64. Per chart review patient lives with spouse and 63 year old son. 1 level home 4 steps to entry. 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 Admitted 09/24/2024 after two recent ED visits with increasing back pain over the 2 weeks with weakness of bilateral lower extremity as well as reported numbness in his perineum and bilateral lower extremities. Denied any urinary or fecal incontinence. MRI initially was done 09/20/2024 as an outpatient without contrast suggestive of discitis and osteomyelitis. Admission chemistries unremarkable except blood culture Streptococcus intermedius, sedimentation rate 55, C-reactive protein 40.9, glucose 110, chloride 96, WBC 13,500.  Patient was initially placed on broad-spectrum antibiotics. Neurosurgery Dr. Alm Carry as well as infectious disease Dr. Dennise consulted. 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 Findings were consistent with discitis and osteomyelitis L4-5 and L5-S1 with additional concern for discitis at L3-L4. Possible osteomyelitis in the posterior L3 vertebral body. Paraspinal and psoas muscle edema with multiple collections concerning for abscesses, including a partially visualized right paraspinal collection at L4-5 and left paraspinal/psoas collection at L5-S1. Severe spinal canal stenosis at L2-3, L3-4 and L4-5 with severe right and moderate to severe left foraminal stenosis at L5-S1. Patient underwent decompressive lumbar laminectomy, medial facetectomy foraminotomies L3-4 and L4-5 with evacuation of epidural abscess 09/24/2024 per Dr. Alm Heather.NO BRACE NEEDED. Hospital course follow-up ID for Streptococcus intermedius bacteremia with latest blood cultures 1/27 showing no growth to date. 1/29 TTE with no vegetations. CT abdomen pelvis with contrast to rule out any intra-abdominal abscess due to high association with pyogenic infection with Streptococcus intermedius showed vague fat stranding along a focal distal sigmoid diverticulum representing resolving or developing acute diverticulitis however doubtful and continue to monitor. No focal soft tissue abnormality. Severe degenerative changes at the L4-L5 level. No acute osseous abnormality. Patient currently remains on IV ceftriaxone  awaiting full sensitivities follow-up per ID. Patient was cleared to begin Lovenox  for DVT prophylaxis 09/28/2024. Psychiatry services consulted 09/26/2024 for adjustment disorder patient with noted increased depression and tearfulness and was started on duloxetine   titrated as needed as well as trazodone  and psychiatry services signed off 09/28/2024. Therapy evaluations completed due to patient decreased functional mobility was admitted for a comprehensive rehab program.     Pt presents with decreased activity tolerance, decreased functional mobility, decreased balance, decreased coordination, acute pain, feelings of stress Limiting pt's independence with leisure/community pursuits.  Met with pt today to discuss TR services including leisure education, activity analysis/modifications and stress management.  Also discussed the importance of social, emotional, spiritual health in addition to physical health and their effects on overall health and wellness.  Pt stated understanding.  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;Community - Other (Comment) (pokemon tourist information centre manager, engineer, civil (consulting), target shooting) Expression Interests: Music (Comment);Dance Other Leisure Interests: Television;Movies;Videogames Leisure Participation Style: With Family/Friends Awareness of Community Resources: Excellent Psychosocial / Spiritual Stress Management: Fair Methods of Stress Management: [place]- get outside Patient agreeable to Pet Therapy: Yes Does patient have pets?: Yes (dog) Community Reintegration Appropriate for Education?: Yes Patient Agreeable to Outing?: Yes Strengths/Weaknesses Patient Strengths/Abilities: Willingness to participate;Active premorbidly Patient weaknesses: Physical limitations TR Patient demonstrates impairments in the following area(s): Endurance;Pain;Motor TR Additional Impairment(s): None  Plan Rec Therapy Plan Is patient appropriate for Therapeutic Recreation?: Yes Rehab Potential: Good Treatment times per week: Min 1 TR session >20 minutes per week Estimated Length of Stay: 3 weeks TR Treatment/Interventions: Adaptive equipment instruction;Group  participation (Comment);Therapeutic exercise;1:1 session;Community reintegration;Recreation/leisure participation;UE/LE Coordination activities;Balance/vestibular training;Functional mobility training;Patient/family education;Therapeutic activities Recommendations for other services: Neuropsych  Recommendations for other services: Neuropsych  Discharge Criteria: Patient will be discharged from TR if patient refuses treatment 3 consecutive times without medical reason.  If treatment goals not met, if there is a change in medical status, if patient makes no progress towards goals or if patient is discharged from hospital.  The above assessment, treatment plan, treatment alternatives and goals were discussed and mutually agreed upon: by patient  Bryahna Lesko 10/04/2024, 1:44 PM

## 2024-10-04 NOTE — Progress Notes (Signed)
 ID brief note  Afebrile No labs today PICC placed  Needs to fu on sensitivities, sent to [facility], pending   Diagnosis: Strep bacteremia, vertebral osteomyelitis  Culture Result: streptococus intermedius bacteremia, strep intermeidus, constellatus vertebral osteomyelitis   Allergies[1]  OPAT Orders Discharge antibiotics to be given via PICC line Discharge antibiotics: ceftriaxone  2 g iv daily  Per pharmacy protocol  Duration: 6 weeks  End Date: 11/07/24  [facility] Care Per Protocol:  Home health RN for IV administration and teaching; PICC line care and labs.    Labs weekly while on IV antibiotics: X__ CBC with differential __ BMP X__ CMP X__ CRP X__ ESR __ Vancomycin  trough __ CK  __ Please pull PIC at completion of IV antibiotics X_ Please leave PIC in place until doctor has seen patient or been notified  Fax weekly labs to [phone]  Clinic Follow Up Appt: 2/27 at 10: 45 am  ID will so  Annalee Orem, MD Infectious Disease Physician [facility] [address] [place], [place] [address] Phone: [phone]  Fax: [phone]      [1] No Known Allergies

## 2024-10-04 NOTE — Progress Notes (Signed)
 "                                                        PROGRESS NOTE   Subjective/Complaints:  Pt reports feeling very sore today due to  clenching to resist muscle spasms yesterday- made him so sore-   Has a major fear of the pain coming back.   Slept pretty well- more than has since been here and significantly better than the night prior.  Down this AM per wife- due to fear of pain and fear things won't progress.   However we went over pt moving a LOT better- put on shirt on his own- didn't wince or face change along with movement.   Slept til 5am- when got meds again. No BM since 1/31- likely due to increase in pain meds.    ROS: per HPI   Pt denies SOB, abd pain, CP, N/V/C/D, and vision changes    (+) Back pain and spasms somewhat better Objective:   No results found.  Recent Labs    10/02/24 0502  WBC 6.3  HGB 12.4*  HCT 36.5*  PLT 594*   No results for input(s): NA, K, CL, CO2, GLUCOSE, BUN, CREATININE, CALCIUM  in the last 72 hours.   Intake/Output Summary (Last 24 hours) at 10/04/2024 0805 Last data filed at 10/04/2024 0534 Gross per 24 hour  Intake --  Output 1450 ml  Net -1450 ml        Physical Exam: Vital Signs Blood pressure 132/84, pulse 88, temperature (!) 97.5 F (36.4 C), temperature source Oral, resp. rate 18, height 6' 3 (1.905 m), weight (!) 140 kg, SpO2 100%.      General: awake, alert, appropriate, on L side somewhat in bed; wife at bedside;  HEENT:  oropharynx moist CV: regular rate and rhythm- rate down from yesterday; no JVD Pulmonary: CTA B/L; no W/R/R- good air movement GI: soft, NT, ND, (+)BS- hypoactive Psychiatric: appropriate but very anxious today- anticipation/fear of pain Neurological: Ox3  Numbness/decreased to light touch L1 downwards B/L  Neuro: Alert and oriented x3. 5/5 strength in UE. 4/5 strength in LE. Decreased sensation in bilateral lower legs, stable 2/1   Assessment/Plan: 1.  Functional deficits which require 3+ hours per day of interdisciplinary therapy in a comprehensive inpatient rehab setting. Physiatrist is providing close team supervision and 24 hour management of active medical problems listed below. Physiatrist and rehab team continue to assess barriers to discharge/monitor patient progress toward functional and medical goals  Care Tool:  Bathing    Body parts bathed by patient: Right arm, Left arm, Chest, Abdomen, Front perineal area, Buttocks, Right upper leg, Face, Left upper leg   Body parts bathed by helper: Left lower leg, Right lower leg     Bathing assist Assist Level: Minimal Assistance - Patient > 75%     Upper Body Dressing/Undressing Upper body dressing   What is the patient wearing?: Pull over shirt    Upper body assist Assist Level: Set up assist    Lower Body Dressing/Undressing Lower body dressing      What is the patient wearing?: Underwear/pull up, Pants     Lower body assist Assist for lower body dressing: Minimal Assistance - Patient > 75%     Toileting Toileting    Toileting assist Assist for toileting:  Minimal Assistance - Patient > 75%     Transfers Chair/bed transfer  Transfers assist     Chair/bed transfer assist level: Minimal Assistance - Patient > 75%     Locomotion Ambulation   Ambulation assist   Ambulation activity did not occur: Safety/medical concerns          Walk 10 feet activity   Assist  Walk 10 feet activity did not occur: Safety/medical concerns        Walk 50 feet activity   Assist Walk 50 feet with 2 turns activity did not occur: Safety/medical concerns         Walk 150 feet activity   Assist Walk 150 feet activity did not occur: Safety/medical concerns         Walk 10 feet on uneven surface  activity   Assist Walk 10 feet on uneven surfaces activity did not occur: Safety/medical concerns         Wheelchair     Assist Is the patient using a  wheelchair?: Yes Type of Wheelchair: Manual    Wheelchair assist level: Dependent - Patient 0%      Wheelchair 50 feet with 2 turns activity    Assist        Assist Level: Dependent - Patient 0%   Wheelchair 150 feet activity     Assist      Assist Level: Dependent - Patient 0%   Blood pressure 132/84, pulse 88, temperature (!) 97.5 F (36.4 C), temperature source Oral, resp. rate 18, height 6' 3 (1.905 m), weight (!) 140 kg, SpO2 100%.    Medical Problem List and Plan: 1. 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 Alm Molt..NO BRACE NEEDED             -patient may shower with back incision covered             -ELOS/Goals: 7-10 days, mod I to supervision goals with PT,OT  D/c 4 weeks likely  Con't CIR PT and OT- educated pt to do what he can today, since sore, but needs to push a little- not to overdo it   2.  Antithrombotics: -DVT/anticoagulation:  Pharmaceutical: Lovenox  initiated 09/28/2024.  Check vascular study 2/2- Dopplers (-)             -antiplatelet therapy: N/A 3. Pain Management: Currently: Celebrex  200 mg every 12 hours, OxyContin  sustained-release 15 mg every 12 hours, change oxycodone  to dilaudid  1mg  q4H for moderate pain, Robaxin  750 mg 4 times daily             -increase oxycontin  CR to 20mg  q12             -continue IV dilaudid  for severe pain- changed to 1mg  q4H prn             -begin tizanidine  2mg  q8 hours for back spasms             -change robaxin  to 750mg  q6 prn breakthrough spasms             -consider trial of lyrica for radicular pain. ( He didn't tolerate gabapentin  however)  2/2- changed PO Dilaudid  to 4 mg q4 hours prn- he looked MUCH better when back to see him 1  hour later- pain initially was 10+/10- but down to ~7-8/10 at  rest sitting up at 45 degrees  2/3- looks much better today- not crying and Dilaudid  4 mg is working VERIZON better- con't regimen  2/4- ended up adding Valium  5 mg q6 hours prn and d/c of Robaxin  and Baclofen - d/w PA- can give him IV Valium  if needed- but doesn't have order- increased Oxycontin  to 30 mg BID yesterday. Will hold off making pain med changes today.  4. Mood/Behavior/Sleep/adjustment disorder: Cymbalta  30 mg daily titrated to 60 mg daily per psychiatry services, trazodone  100 mg nightly. Discussed with his wife that he is receiving Cymbalta  for his anxiety and this can also help with pain  2/3- less anxiety seen today since pain under better control             -antipsychotic agents: N/A 5. Neuropsych/cognition: This patient is capable of making decisions on his own behalf. 6. Skin/Wound Care: Routine skin checks 7. Fluids/Electrolytes/Nutrition: Routine ins and outs with follow-up chemistries 8.  Streptococcus intermedius bacteremia.  Continue IV ceftriaxone  per infectious disease.  TTE unremarkable.  Repeat blood cultures drawn 1/27 NGTD.  Blood cultures need to be negative for 72 hours prior to PICC line placement.  2/2- has PICC-  9.  Class III obesity.  BMI 40.64.  Lifestyle changes with dietary follow-up  2/3- Weighed again- weight 140 kg/BMI 38.58 10.  Tobacco use: continue NicoDerm patch.  Provide counseling  2/3- pt advised smoking can increase risk of return of infection 11.  ? neurogenic bowel and bladder.  Emptying well. Check PVRs. -moved his bowels 3 days ago -scheduled miralax  qam along with senna-s 2 tabs at night -encourage appropriate PO intake  -increase magnesium  to 400mg  HS, LBM 1/31  2/3- LBM 1/31- if no BM today, will give Sorbitol  tomorrow- made sure pt took his miralax  today  2/4- Will order sorbitol  30cc if no BM by 2pm and Soap suds enema if no results by 6pm.  Don't want pt pushing a lot to have  BM- has been at least 4 days.     I spent a total of  51  minutes on total care today- >50% coordination of care- due to  D/w PA about pain regimen as well as nursing- and how to handle step wise if has increased pain  - wait on increasing pain meds- can give IV valium  if need be- also d/w wife and pt at length about meds and need to do some therapy- also has neuropsychology today this afternoon!  LOS: 5 days A FACE TO FACE EVALUATION WAS PERFORMED  Alexander Roberts 10/04/2024, 8:05 AM     "

## 2024-10-04 NOTE — Progress Notes (Signed)
 Recreational Therapy Session Note  Patient Details  Name: Alexander Roberts MRN: [id number] Date of Birth: 1986-07-23 Today's Date: 10/04/2024  Pain: no c/o Pt participated in animal assisted activity bed level with supervision. Pt easily engaged with pet partner team and was appreciative of this visit. Pet visitation guidelines were also provided.  Sitlaly Gudiel 10/04/2024, 4:06 PM

## 2024-10-04 NOTE — Progress Notes (Signed)
 Occupational Therapy Session Note  Patient Details  Name: Alexander Roberts MRN: [id number] Date of Birth: August 20, 1986  Today's Date: 10/04/2024 OT Individual Time: 1030-1057 OT Individual Time Calculation (min): 27 min    Short Term Goals: Week 1:  OT Short Term Goal 1 (Week 1): Pt will don LB clothing with Mod A OT Short Term Goal 2 (Week 1): Pt will complete 3/3 steps for toileting with Min A OT Short Term Goal 3 (Week 1): Pt will complete functional transafers with LRAD with CGA  Skilled Therapeutic Interventions/Progress Updates:   Patient received supine in bed.  Explained that he was getting additional therapy today after missing time yesterday due to pain and low BP.   Patient reports soreness from spasms yesterday.  Patient agreeable to additional OT session.  Worked on transitioning from flat bed to more upright and back down while breathing thru transition versus tensing and leading to increased pain.  Worked on LE movement and strengthening using both isotonic and isometric exercises.   Patient with cramp in adductor right hip - worked on lengthening, and relaxing to reduce discomfort.  Patient left in bed at end of session with needed items in reach.    Therapy Documentation Precautions:  Precautions Precautions: Fall, Back Recall of Precautions/Restrictions: Intact Precaution/Restrictions Comments: Fall, spinal Restrictions Weight Bearing Restrictions Per Provider Order: No   Pain: Pain Assessment Pain Scale: 0-10 Pain Score: 6  Pain Location: Back Pain Intervention(s): Medication (See eMAR);Repositioned    Therapy/Group: Individual Therapy  Kailie Polus M 10/04/2024, 12:48 PM

## 2024-10-04 NOTE — Progress Notes (Signed)
 Occupational Therapy Session Note  Patient Details  Name: Alexander Roberts MRN: [id number] Date of Birth: 10/07/1985  Today's Date: 10/04/2024 OT Individual Time: 1100-1203 OT Individual Time Calculation (min): 63 min    Short Term Goals: Week 1:  OT Short Term Goal 1 (Week 1): Pt will don LB clothing with Mod A OT Short Term Goal 2 (Week 1): Pt will complete 3/3 steps for toileting with Min A OT Short Term Goal 3 (Week 1): Pt will complete functional transafers with LRAD with CGA  Skilled Therapeutic Interventions/Progress Updates:  Skilled OT session completed to address ADL retraining and pain mgmt. Pt received supine in bed with LPN present, agreeable to participate in therapy. Pt reports 4/10 pain in low back, OT provided pain intervention by repositioning and ice.   OT and LPN providing education on safety with transfers to toilet and EOB>BSC is currently the safest transfer. Pt verbalized understanding; however, not comfortable with BSC d/t privacy and pain mgmt. Supine>EOB supervision with HOB elevated and bed rails for BUE support. Pt seated EOB without BUE support, endorsing pain improvement! OT donned socks with total A for comfort. Pt completed functional mobility with RW CGA +2 to toilet. Pt completed toileting with distant supervision for privacy, pt able to manage hygiene with adherence to back precautions. Pt completed functional mobility to sink CGA with RW, pt endorsing pain and sweating profusely. Pt completed functional mobility to EOB CGA with RW. Pt requesting to complete additional peri care supine to manage pain. EOB>Supine Min A to lift LLE in bed. Pt completed posterior peri care in bed with set-up A. Pt reporting muscle spasms in low back, OT encouraged pt in side-lying with set-up A to place pillow between knees. OT provided ice pack, pt placed independently. Pt side-lying in bed with bedside table in reach and LPN present.  Therapy Documentation Precautions:   Precautions Precautions: Fall, Back Recall of Precautions/Restrictions: Intact Precaution/Restrictions Comments: Fall, spinal Restrictions Weight Bearing Restrictions Per Provider Order: No   Therapy/Group: Individual Therapy  Abbigael Detlefsen Woods-Chance, MS, OTR/L 10/04/2024, 12:08 PM

## 2024-10-04 NOTE — Progress Notes (Signed)
 Occupational Therapy Session Note  Patient Details  Name: Alexander Roberts MRN: [id number] Date of Birth: 1986-03-28  Today's Date: 10/04/2024 OT Individual Time: 1415-1500 OT Individual Time Calculation (min): 45 min    Short Term Goals: Week 1:  OT Short Term Goal 1 (Week 1): Pt will don LB clothing with Mod A OT Short Term Goal 2 (Week 1): Pt will complete 3/3 steps for toileting with Min A OT Short Term Goal 3 (Week 1): Pt will complete functional transafers with LRAD with CGA  Skilled Therapeutic Interventions/Progress Updates:      Therapy Documentation Precautions:  Precautions Precautions: Fall, Back Recall of Precautions/Restrictions: Intact Precaution/Restrictions Comments: Fall, spinal Restrictions Weight Bearing Restrictions Per Provider Order: No General: Pt supine in bed upon OT arrival, agreeable to OT session.  Pain: 7/10 pain reported in HA and back, activity, intermittent rest breaks, distractions provided for pain management, pt reports tolerable to proceed.   Exercises: Pt completed the following exercise circuit in order to improve functional activity, strength and endurance to prepare for ADLs such as bathing. Pt completed the following exercises in supine position with no noted LOB/SOB and 3x10 repetitions on each exercise with rest breaks for pain management: -resistive clam shells -heel slides  Other Treatments: OT providing therapeutic use of self in order to build rapport and discuss patient current situation and goals for therapy.  Pt supine in bed with bed alarm activated, 2 bed rails up, call light within reach and 4Ps assessed.   Therapy/Group: Individual Therapy  Camie Hoe, OTD, OTR/L 10/04/2024, 4:24 PM

## 2024-10-04 NOTE — Consult Note (Signed)
 Neuropsychological Consultation Comprehensive Inpatient Rehab   Patient:   Alexander Roberts   DOB:   03/20/1986  MR Number:  [id number]  Location:  [facility] [address] [place] [address] Dept: [phone] Loc: [phone]           Date of Service:   2//2026  Start Time:   3 PM End Time:   4 PM  Provider/Observer:  Norleen Asa, Psy.D.       Clinical Neuropsychologist       Billing Code/Service: [id number]  Reason for Service:    Alexander Roberts is a 39 year old male admitted for neuropsychological consultation during his ongoing admission to the comprehensive inpatient rehabilitation unit.  The purpose of this was to assess emotional status following recent lumbar laminectomy for discitis and osteomyelitis with epidural abscess. Evaluation of functional impact and coping.  Presenting Concerns: Reports variable mood, with some days being good and others not a good day, leading to feelings of hopelessness. Reports numbness from the knee down on the left leg with associated weakness. Also reports spasms and constipation.  Relevant Clinical History: - Neurological: Admitted 09/24/2024 for increasing back pain, bilateral lower extremity weakness, and numbness in perineum and bilateral lower extremities. MRI on 09/20/2024 suggested discitis and osteomyelitis. Follow-up MRI on 09/24/2024 showed multiple epidural abscesses at L3, L4, L5 causing severe spinal canal stenosis. Findings consistent with discitis and osteomyelitis at L4-5 and L5-S1. Underwent decompressive lumbar laminectomy, medial facetectomy, and foraminotomies at L3-4 and L4-5 with evacuation of epidural abscess on 09/24/2024. No brace needed. - Psychiatric: Psychiatry consult on 09/26/2024 for adjustment disorder with depression and tearfulness. Diagnosed with adjustment disorder secondary to another medical condition. Started on duloxetine  and  trazodone . Psychiatry services signed off on 09/28/2024. - Developmental/Academic: Graduated high school. - Medical: PMHx of chronic back pain with epidural injection 2 years ago, tobacco use, and class III obesity (BMI 40.64). Admitted 09/24/2024. Blood culture positive for Streptococcus intermedius. Currently on IV ceftriaxone . Cleared for Lovenox  for DVT prophylaxis on 09/28/2024. TTE on 09/28/2024 showed no vegetations. CT abdomen/pelvis showed vague fat stranding at a distal sigmoid diverticulum, no focal soft tissue abnormality. Severe degenerative changes at L4-L5. No acute osseous abnormality. Medications include duloxetine , trazodone , ceftriaxone , daptomycin , celecoxib , methocarbamol , nicotine  patch, ondansetron , oxycodone , senna-docusate, bisacodyl , ipratropium, menthol , and hydralazine . - Forensic or Behavioural: No legal history.  Functional Capacity: Lives with spouse and 58 year old son in a one-level home with 4 steps to entry. Was independent prior to admission. Works for [employer], requiring lifting of heavy boxes. Currently has decreased functional mobility and is in a comprehensive rehab program.  Mental State Examination: - Appearance and Behaviour: Cooperative and compliant with hospital rules. Appears fatigued but engaged in conversation. - Speech: Clear, coherent, and at a normal rate. - Mood and Affect: Reports variable mood, with some days being good and others not a good day, leading to feelings of hopelessness. Affect is congruent with mood, anxious and depressed. - Thought Process/Content: Thought process is coherent and linear. Content is without delusions or paranoia. No suicidal or homicidal ideation. - Cognition: Alert and oriented to person, place, and time. Attention and concentration appear normal. Memory is good. - Insight and Judgement: Good insight into his condition and the rationale for hospital procedures. Judgment appears good.  During today's  visit the patient reports ongoing numbness from the knee down on the left leg with decreased strength. Also reports muscle spasms. Reports constipation, with  a bowel movement today after 3-4 days without one, which is attributed to pain medication. Denies urinary incontinence.  Reports good days and bad days, with yesterday being a difficult day mentally. Expresses concern about the possibility of not getting better. Reports following all medical advice and rules, including bed exercises. He is motivated to participate in his recovery.  Treatment Interventions: Provided psychoeducation regarding the nature of his spinal infection, the surgical procedure (laminectomy), and the rationale for the current medical and rehabilitative plan.  Discussed the expected recovery process, including the healing of myelin, and the importance of adhering to physical restrictions to prevent re-injury. Encouraged continued engagement with the rehabilitation team and reinforced the importance of asking questions.  Participation Level: Good  Participation Quality: Good  Current Psychosocial Factors: Lives with spouse and 73 year old son in a one-level home. Previously independent and worked as a production designer, theatre/television/film at [employer], which required heavy lifting. Current stressors include the acute medical event, pain, and the impact on his independence. Reports feeling a mental toll from the recovery process.  Content of Session: Reviewed the patient's understanding of his condition, which was accurate. Provided detailed psychoeducation on the pathophysiology of his spinal infection, the surgical intervention, and the rationale for the current treatment plan. Discussed the roles of the various medical specialties involved in his care (neurosurgery, infectious disease, physiatry). Addressed his concerns about numbness and weakness, explaining the limitations of current diagnostic tools and the expected recovery process. Discussed  the importance of adhering to activity restrictions and the rationale behind them. Encouraged him to continue asking questions and to report any significant changes in mood or function.  Effectiveness of Interventions: The patient appeared to understand the information provided. He was engaged and asked clarifying questions. He expressed appreciation for the straightforward communication and seemed reassured by the explanation of the recovery process.  Target Goals: Focus on recovery from spinal cord injury, management of post-surgical symptoms (numbness, weakness, spasms), and adjustment to his medical condition. Continue to manage adjustment disorder with medication and support. Increase independence with activities of daily living.  Clinical Impressions: The patient presents with a good understanding of his medical condition and the rationale for treatment. He demonstrates appropriate insight and judgment. Mood is variable, consistent with an adjustment disorder, but he is engaged and motivated to participate in his recovery. The primary neurocognitive concerns relate to the functional impact of his spinal cord injury (weakness, numbness, spasms) and the emotional toll of his hospitalization and recovery.   Recommendations and Next Steps: - Continue to monitor mood and coping. - Encourage continued compliance with therapy and medical recommendations. - Psychoeducation provided regarding the nature of his spinal infection, the surgical procedure, and the rationale for hospital protocols to prevent new infections. - Encouraged to ask questions and report any significant changes in mood or function to staff. - Will continue to follow the patient's progress during his inpatient stay.        Electronically Signed   _______________________ Norleen Asa, Psy.D. Clinical Neuropsychologist

## 2024-10-05 LAB — SUSCEPTIBILITY RESULT

## 2024-10-05 LAB — SUSCEPTIBILITY, AER + ANAEROB: Source: 8680

## 2024-10-05 MED ORDER — DIAZEPAM 5 MG PO TABS
7.5000 mg | ORAL_TABLET | Freq: Four times a day (QID) | ORAL | Status: AC | PRN
  Administered 2024-10-05 – 2024-10-06 (×5): 7.5 mg via ORAL
  Filled 2024-10-05 (×5): qty 2

## 2024-10-05 NOTE — Progress Notes (Signed)
 Occupational Therapy Session Note  Patient Details  Name: Soma Lizak MRN: [id number] Date of Birth: [birth year]  Session 1 Today's Date: 10/05/2024 OT Individual Time: 1105-1200 OT Individual Time Calculation (min): 55 min  Session 2 Today's Date: 10/05/2024 OT Individual Time: 8694-8651 OT Individual Time Calculation (min): 43 min    Short Term Goals: Week 1:  OT Short Term Goal 1 (Week 1): Pt will don LB clothing with Mod A OT Short Term Goal 2 (Week 1): Pt will complete 3/3 steps for toileting with Min A OT Short Term Goal 3 (Week 1): Pt will complete functional transafers with LRAD with CGA  Skilled Therapeutic Interventions/Progress Updates:  Session 1: Skilled OT session completed to address [facility] and pt education. Pt received supine in bed, agreeable to participate in therapy. Pt reports 5/10 pain in low back, premedicated prior to OT session.   Pt respectfully declining self-care needs at this time, endorsing wife assisting with bathing/dressing this date. Pt and OT collaborating on pain mgmt as pt endorsing pain becoming manageable; however, fearful for OOB participation as previously caused intense pain. OT verbalized understanding and continuing sessions this date at bed level.   OT provided the following BUE stretches and exs to increase [facility] and reduce pain in BUE:  Pt required VC and visual cues for technique; OT provided HEP to allow for completion in room. OT also provided green therapy sponge to increase functional grasp; pt receptive and endorsing reduced pain in BUE.   Pt requesting additional edu on CLOF and timeline for healing, OT provides pt edu binder and projected timeline for healing; however, endorsing MD is best person to consult for concerns of diagnosis. Pt verbalized understanding. Pt supine in bed with all needs in reach.     Session 2: Skilled OT session completed to address ADL retraining and pt education. Pt received supine in bed with wife present,  agreeable to participate in therapy. Pt reports 5/10 pain in low back; premedicated prior to OT session.  OT provided education and demo on the following AE to increase independence with LB dressing: LH shoe horn, sock aid, LH sponge, reacher, dressing stick, and elastic shoe laces. Pt returned demo with reacher. Wife and pt endorsing ordering reacher privately to assist with LB dressing. OT also provided demo on 4-point sitting to increase independence with LB dressing and bathing, pt attempts to return in supine; task discontinued to adhere to precautions. OT attempting to continue cauda equina education and alternative approaches to ADLs, pt endorsing currently overwhelmed with education this date; however, Pt requesting information on alternative approaches to return to work.  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 Pt and wife verbalized understanding. Pt supine in bed with bed alarm on and all needs within reach.   Therapy Documentation Precautions:  Precautions Precautions: Fall, Back Recall of Precautions/Restrictions: Intact Precaution/Restrictions Comments: Fall, spinal Restrictions Weight Bearing Restrictions Per Provider Order: No    Therapy/Group: Individual Therapy  Carnelius Hammitt [place], MS, OTR/L 10/05/2024, 7:54 AM

## 2024-10-05 NOTE — Plan of Care (Signed)
" °  Problem: Consults Goal: RH GENERAL PATIENT EDUCATION Description: See Patient Education module for education specifics. Outcome: Progressing   Problem: RH BOWEL ELIMINATION Goal: RH STG MANAGE BOWEL WITH ASSISTANCE Description: STG Manage Bowel with supervision - mod I  Assistance. Outcome: Progressing   Problem: RH BLADDER ELIMINATION Goal: RH STG MANAGE BLADDER WITH ASSISTANCE Description: STG Manage Bladder With supervision- mod I Assistance Outcome: Progressing   Problem: RH SKIN INTEGRITY Goal: RH STG SKIN FREE OF INFECTION/BREAKDOWN Description: Manage skin with supervision- mod I assistance Outcome: Progressing   Problem: RH SAFETY Goal: RH STG ADHERE TO SAFETY PRECAUTIONS W/ASSISTANCE/DEVICE Description: STG Adhere to Safety Precautions With supervision- mod I Assistance/Device. Outcome: Progressing   Problem: RH PAIN MANAGEMENT Goal: RH STG PAIN MANAGED AT OR BELOW PT'S PAIN GOAL Description: <4 w/ prns Outcome: Progressing   Problem: RH KNOWLEDGE DEFICIT GENERAL Goal: RH STG INCREASE KNOWLEDGE OF SELF CARE AFTER HOSPITALIZATION Description: Manage increase knowledge of self care after hospitalization with supervision- mod I assistance from wife using educational materials provided Outcome: Progressing   "

## 2024-10-05 NOTE — Progress Notes (Signed)
 Educated about CHG bath needing to be completed twice a day. Per patient mentions significant other will complete the CHG bath later today. Site care to incision are and adhesive strips changed. Patient currently is on left lateral side and has an ice back to the lower area of his back.

## 2024-10-05 NOTE — Progress Notes (Signed)
 "                                                        PROGRESS NOTE   Subjective/Complaints:  Pt reports the muscle spasms are his main limitation- spasms came back last night- and were 10/10 and took awhile to get under control- a couple of hours Valium  is real helpful- but wears off early- explained cannot give more frequently, but we can try to give a slightly higher dose- did advise pt and wife that Valium  potentiates pain meds- so we will keep an eye on him regarding sedation.   Stood yesterday with less pain and pain is 6/10 this AM! So getting better.  Still gets to 10/10 with spasms though and didn't sleep well because of spasms.   L great toe less numbness- other toes the same.   Not having Am erections anymore and used to get frequently. Also more difficult with psychogenic erections as well since hospitalized.   LBM yesterday- feels better  ROS: per HPI   Pt denies SOB, abd pain, CP, N/V/C/D, and vision changes    (+) Back pain and spasms somewhat better but still bothersome Objective:   No results found.  No results for input(s): WBC, HGB, HCT, PLT in the last 72 hours.  No results for input(s): NA, K, CL, CO2, GLUCOSE, BUN, CREATININE, CALCIUM  in the last 72 hours.   Intake/Output Summary (Last 24 hours) at 10/05/2024 1025 Last data filed at 10/05/2024 0607 Gross per 24 hour  Intake 118 ml  Output 620 ml  Net -502 ml        Physical Exam: Vital Signs Blood pressure 121/80, pulse 90, temperature 98.2 F (36.8 C), temperature source Oral, resp. rate 18, height 6' 3 (1.905 m), weight (!) 140 kg, SpO2 99%.       General: awake, alert, supine in bed; wife at bedside; rate pain 6/10;  NAD HENT: conjugate gaze; oropharynx moist CV: regular rate and rhythm- rate in 90's; no JVD Pulmonary: CTA B/L; no W/R/R- good air movement GI: soft, NT, ND, (+)BS- more normoactive Psychiatric: slightly less anxious, but got very depressed when  we discussed sexuality Neurological: Ox3  RLE- HF 3+/5; KE/KF/DF and PF 5-/5 LLE- HF 2_/5 at best; KE/KF 4-/5 and DF/PF 5-/5 Numbness/decreased to light touch L1 downwards B/L  Neuro: Alert and oriented x3. 5/5 strength in UE. 4/5 strength in LE. Decreased sensation in bilateral lower legs, stable 2/1   Assessment/Plan: 1. Functional deficits which require 3+ hours per day of interdisciplinary therapy in a comprehensive inpatient rehab setting. Physiatrist is providing close team supervision and 24 hour management of active medical problems listed below. Physiatrist and rehab team continue to assess barriers to discharge/monitor patient progress toward functional and medical goals  Care Tool:  Bathing    Body parts bathed by patient: Right arm, Left arm, Chest, Abdomen, Front perineal area, Buttocks, Right upper leg, Face, Left upper leg   Body parts bathed by helper: Left lower leg, Right lower leg     Bathing assist Assist Level: Minimal Assistance - Patient > 75%     Upper Body Dressing/Undressing Upper body dressing   What is the patient wearing?: Pull over shirt    Upper body assist Assist Level: Set up assist    Lower Body Dressing/Undressing  Lower body dressing      What is the patient wearing?: Underwear/pull up, Pants     Lower body assist Assist for lower body dressing: Minimal Assistance - Patient > 75%     Toileting Toileting    Toileting assist Assist for toileting: Minimal Assistance - Patient > 75%     Transfers Chair/bed transfer  Transfers assist     Chair/bed transfer assist level: Minimal Assistance - Patient > 75%     Locomotion Ambulation   Ambulation assist   Ambulation activity did not occur: Safety/medical concerns          Walk 10 feet activity   Assist  Walk 10 feet activity did not occur: Safety/medical concerns        Walk 50 feet activity   Assist Walk 50 feet with 2 turns activity did not occur:  Safety/medical concerns         Walk 150 feet activity   Assist Walk 150 feet activity did not occur: Safety/medical concerns         Walk 10 feet on uneven surface  activity   Assist Walk 10 feet on uneven surfaces activity did not occur: Safety/medical concerns         Wheelchair     Assist Is the patient using a wheelchair?: Yes Type of Wheelchair: Manual    Wheelchair assist level: Dependent - Patient 0%      Wheelchair 50 feet with 2 turns activity    Assist        Assist Level: Dependent - Patient 0%   Wheelchair 150 feet activity     Assist      Assist Level: Dependent - Patient 0%   Blood pressure 121/80, pulse 90, temperature 98.2 F (36.8 C), temperature source Oral, resp. rate 18, height 6' 3 (1.905 m), weight (!) 140 kg, SpO2 99%.    Medical Problem List and Plan: 1. 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 Alm Molt..NO BRACE NEEDED             -patient may shower with back incision covered             -ELOS/Goals: 7-10 days, mod I to supervision goals with PT,OT  D/c 4 weeks likely  Con't CIR PT and OT  We had a long discussion about his cauda equina dx- base don clinical weakness and Numbness exam as well as erectile dysfunction- we discussed there are ways around the erectile dysfunction issues and will see how gets better in the next weeks.  2.  Antithrombotics: -DVT/anticoagulation:  Pharmaceutical: Lovenox  initiated 09/28/2024.  Check vascular study 2/2- Dopplers (-) 2/5- will need for at least 2 months total if not 3 depending on how far walking             -antiplatelet therapy: N/A 3. Pain Management: Currently: Celebrex  200 mg every 12 hours,  OxyContin  sustained-release 15 mg every 12 hours, change oxycodone  to dilaudid  1mg  q4H for moderate pain, Robaxin  750 mg 4 times daily             -increase oxycontin  CR to 20mg  q12             -continue IV dilaudid  for severe pain- changed to 1mg  q4H prn             -  begin tizanidine  2mg  q8 hours for back spasms             -change robaxin  to 750mg  q6 prn breakthrough spasms             -consider trial of lyrica for radicular pain. ( He didn't tolerate gabapentin  however)  2/2- changed PO Dilaudid  to 4 mg q4 hours prn- he looked MUCH better when back to see him 1 hour later- pain initially was 10+/10- but down to ~7-8/10 at rest sitting up at 45 degrees  2/3- looks much better today- not crying and Dilaudid  4 mg is working VERIZON better- con't regimen  2/4- ended up adding Valium  5 mg q6 hours prn and d/c of Robaxin  and Baclofen - d/w PA- can give him IV Valium  if needed- but doesn't have order- increased Oxycontin  to 30 mg BID yesterday. Will hold off making pain med changes today.   2/5- will increase Valium  to 7.5 mg q6 hours prn- no other med changes 4. Mood/Behavior/Sleep/adjustment disorder: Cymbalta  30 mg daily titrated to 60 mg daily per psychiatry services, trazodone  100 mg nightly. Discussed with his wife that he is receiving Cymbalta  for his anxiety and this can also help with pain  2/3- less anxiety seen today since pain under better control  2/5- more anxiety today with cauda equina dx that we discussed as well as erectile issues that were finally determined             -antipsychotic agents: N/A 5. Neuropsych/cognition: This patient is capable of making decisions on his own behalf. 6. Skin/Wound Care: Routine skin checks 7. Fluids/Electrolytes/Nutrition: Routine ins and outs with follow-up chemistries 8.  Streptococcus intermedius bacteremia.  Continue IV ceftriaxone  per infectious disease.  TTE unremarkable.  Repeat blood cultures drawn 1/27 NGTD.  Blood cultures need to be negative for  72 hours prior to PICC line placement.  2/2- has PICC-  9.  Class III obesity.  BMI 40.64.  Lifestyle changes with dietary follow-up  2/3- Weighed again- weight 140 kg/BMI 38.58 10.  Tobacco use: continue NicoDerm patch.  Provide counseling  2/3- pt advised smoking can increase risk of return of infection  2/5- pt really wants to stop since he's heard could affect his ability to have erections 11.  Mild neurogenic bowel and bladder.  Emptying well. Check PVRs. -moved his bowels 3 days ago -scheduled miralax  qam along with senna-s 2 tabs at night -encourage appropriate PO intake  -increase magnesium  to 400mg  HS, LBM 1/31  2/3- LBM 1/31- if no BM today, will give Sorbitol  tomorrow- made sure pt took his miralax  today  2/4- Will order sorbitol  30cc if no BM by 2pm and Soap suds enema if no results by 6pm.  Don't want pt pushing a lot to have BM- has been at least 4 days.   2/5- LBM yesterday- will con't regimen for now 12. Erectile dysfunction  2/5- we discussed no Am erections and fewer psychogenic erections- might need Rx for Viagra at d/c,  but will monitor    I spent a total of 1 hour 27   minutes on total care today- >50% coordination of care- due to d/w pt about cauda equina- going over anatomy and compression- also went over erectile dysfunction and assuaged his anxiety somewhat about these issues- pt still very anxious/worried about outcomes. D/w team about pt's issues with partial cauda equina and d/w nursing, nursing coordinator, etc  LOS: 6 days A FACE TO FACE EVALUATION WAS PERFORMED  Amandalynn Pitz 10/05/2024,  10:25 AM     "

## 2024-10-05 NOTE — Progress Notes (Signed)
 Recreational Therapy Session Note  Patient Details  Name: Alexander Roberts MRN: [id number] Date of Birth: 1985/12/18 Today's Date: 10/05/2024 Goal:  Pt will identify potential coping strategies to assist in reducing worry with min cues.  MET Pain: c/o back pain/spasms/premedicated Skilled Therapeutic Interventions/Progress Updates: Session focused on pt education during co-treat with PT.  Pt performed bed level exercise/stretches with PT while discussing coping strategies with LRT/CTRS.  Pt states that he is a chronic worrier,  Pt reports that he is the one who takes care of everyone and is struggling with the fact that he can't right not.  Discussed that taking care of everyone as well as himself is not just physical, but also social, emotional, spiritual.  Discussed use of leisure, deep breathing, therapeutic use of music as potential coping strategies to use in the room throughout LOS. Will provide diversional activities PRN.  Pt brought up concerns about intimacy, encouraged pt to further discuss with Dr. Lovorn.    Therapy/Group: Co-Treatment Jameela Michna 10/05/2024, 9:19 AM

## 2024-10-05 NOTE — Progress Notes (Signed)
 VAST consult. Arrived to pt room. Pt ATB infusing. Notified RN at [facility] to place consult when ready for VAST to return. RN VU. Powell Bowler, RN VAST

## 2024-10-05 NOTE — Progress Notes (Signed)
 Physical Therapy Session Note  Patient Details  Name: Alexander Roberts MRN: [id number] Date of Birth: April 10, 1986  Today's Date: 10/05/2024 PT Individual Time: 9153-9055, 8582-8497 PT Individual Time Calculation (min): 58 min, 45 min  Short Term Goals: Week 1:  PT Short Term Goal 1 (Week 1): pt will tolerate sitting OOB in TIS WC for at least 2 hours at a time PT Short Term Goal 2 (Week 1): pt will ambulate 100 feet with LRAD and min A PT Short Term Goal 3 (Week 1): pt will perform dynamic seated balance with LRAD and CGA PT Short Term Goal 4 (Week 1): pt will perform bed to chair transfer with LRAD and CGA  Skilled Therapeutic Interventions/Progress Updates:      Treatment Session 1   Pt supine in bed upon arrival. Pt agreeable to therapy. Pt endorsing increased spasticity throughout the night, keeping pt up throughout the night. Premedicated. Therapist provided PROM for pain management.   Pt performed the following supine therex for B LE strengthening/ROM/spasticity management/pain management:   1x30 ankle pumps 1x15 active hook lying knee fall outs with no resistance, 1x20 with red TB  1x15 AA knee to chest B  Passive hip flexion Passive hip IR/ER Passive 90-90 HS stretch  Pt endorsed conversation with MD regarding sexual health. Pt initiating asking questions to PT about this. PT deferring to OT.   Pt supine in bed with all needs within reach and bed alarm on.   Treatment Session 2   Pt supine in bed upon arrival. Pt agreeable to pain. Pt endorsing pt overall pretty well managed throughout session.   Treatment session focused on upright tolerance, quality of life, overall mood.   Pt performed stand pivot transfer bed to WC, WC to bed with RW and CGA/light min A overall.   Supine to sit, sit to supine with supervision (pt managing L LE with use of R LE).   PT transported dependent in TIS WC throughout CIR unit (PT provided tour of the unit -with education on available  equipment). Transported to [employer]. Pt very appreciative of this and very proud of all that he has accomplished today.   Pt endorsing the stretching helping with his pain and spasticity management. Pt wife present during session. Education and demonstration provided for how to perform passive hip flexion/IR/ER. Pt wife returned demonstration. Education provided to utilize this as a tool for main and spasticity management and hopefully improved sleep throughout the night. Pt and pt wife very appreciative of this.   Pt endorsing feeling overwhelmed and depressed after receiving educational material from nursing regarding bladder retention, as well as handouts regarding cauda equina. PT endorsing he hadn't peed 2/2 privacy. Education provided regarding importance of not holding his bladder - to ask staff to step out as needed to use urinal. Pt verablzied understanding and agreeable. Education provided regarding use of handouts as reference tool to better understanding diagnosis, signs/symptoms, what to expect, and ask questions as needed. Pt verbalized understanding and agreeable and endorses the sensation in his buttocks and legs is still impaired but is improving each day with increased mobility.  Pt supine in bed at end of session with all needs within reach and bed alarm on.     Therapy Documentation Precautions:  Precautions Precautions: Fall, Back Recall of Precautions/Restrictions: Intact Precaution/Restrictions Comments: Fall, spinal Restrictions Weight Bearing Restrictions Per Provider Order: No  Therapy/Group: Individual Therapy  [facility] [place], [place], DPT  10/05/2024, 7:59 AM

## 2024-10-06 LAB — CREATININE, SERUM
Creatinine, Ser: 0.75 mg/dL (ref 0.61–1.24)
GFR, Estimated: >60 mL/min

## 2024-10-06 MED ORDER — OXYCODONE HCL ER 15 MG PO T12A
30.0000 mg | EXTENDED_RELEASE_TABLET | Freq: Two times a day (BID) | ORAL | Status: AC
  Administered 2024-10-06: 30 mg via ORAL
  Filled 2024-10-06: qty 2

## 2024-10-06 NOTE — Progress Notes (Signed)
 Not wanting CHG bath at this time. Explained significant other will complete CHG bath later in the evening. Given PRN pain medication. Encouraged if muscle spasms continue around 2000 to ask for PRN medication.

## 2024-10-06 NOTE — Progress Notes (Addendum)
 Physical Therapy Session Note  Patient Details  Name: Alexander Roberts MRN: [id number] Date of Birth: 1986-07-15  Today's Date: 10/06/2024 PT Individual Time: 8584-8481 PT Individual Time Calculation (min): 63 min   Short Term Goals: Week 1:  PT Short Term Goal 1 (Week 1): pt will tolerate sitting OOB in TIS WC for at least 2 hours at a time PT Short Term Goal 2 (Week 1): pt will ambulate 100 feet with LRAD and min A PT Short Term Goal 3 (Week 1): pt will perform dynamic seated balance with LRAD and CGA PT Short Term Goal 4 (Week 1): pt will perform bed to chair transfer with LRAD and CGA  Skilled Therapeutic Interventions/Progress Updates: Pt presents L sidelying and agreeable to therapy.  Pt voices frustration w/ what he feels is no progress.  Encouraged pt on progress made and moving forward.  Pt received pain/muscle relaxer at initiation of therapy to maintain pain levels at tolerable levels to continue mobility.  Pt tolerated AP, GS SAQ, isometric add 3 x 15, cues for slow performance.  Long stretches to HS, gluts and IR and ER w/ good response.  Pt agreeable to transfer to sitting EOB.  Pt performs log roll w/ supervision and then to sitting.  Pt unable to remain sitting > 2 min.  Pt requires min A for LES.  Pt rolled to R sidelying w/ pillow between LES for comfort.  Ice pack to low back for comfort.  Missed time of 79' 2/2 pain/fatigue.     Therapy Documentation Precautions:  Precautions Precautions: Fall, Back Recall of Precautions/Restrictions: Intact Precaution/Restrictions Comments: Fall, spinal Restrictions Weight Bearing Restrictions Per Provider Order: No General: PT Amount of Missed Time (min): 12 Minutes PT Missed Treatment Reason: Pain Vital Signs: Therapy Vitals Temp: 98.1 F (36.7 C) Pulse Rate: 97 Resp: 18 BP: 130/77 Patient Position (if appropriate): Lying Oxygen Therapy SpO2: 99 % O2 Device: Room Air Pain:8/10 Pain Assessment Pain Scale: 0-10 Pain Score:  6  Pain Location: Back Pain Orientation: Mid;Lower Pain Descriptors / Indicators: Aching;Discomfort Pain Frequency: Constant Pain Onset: With Activity Pain Intervention(s): Medication (See eMAR) Multiple Pain Sites: No      Therapy/Group: Individual Therapy  Lusia Greis P Arletta Lumadue 10/06/2024, 3:41 PM

## 2024-10-06 NOTE — Progress Notes (Signed)
 Occupational Therapy Session Note  Patient Details  Name: Alexander Roberts MRN: [id number] Date of Birth: [birth year]  Session 1 Today's Date: 10/06/2024 OT Individual Time: 0930-1020 OT Individual Time Calculation (min): 50 min  Session 2 Today's Date: 10/06/2024 OT Individual Time: 8894-8767 OT Individual Time Calculation (min): 87 min    Short Term Goals: Week 1:  OT Short Term Goal 1 (Week 1): Pt will don LB clothing with Mod A OT Short Term Goal 2 (Week 1): Pt will complete 3/3 steps for toileting with Min A OT Short Term Goal 3 (Week 1): Pt will complete functional transafers with LRAD with CGA  Skilled Therapeutic Interventions/Progress Updates:  Session 1: Skilled OT session completed to address pain mgmt and BLE strengthening. Pt received supine in bed, agreeable to participate in therapy. Pt reports 6/10 pain in low back, OT provided pain intervention by stretching and repositioning.   Pt requesting to stretch BLE prior to OOB participation endorsing increases participation. Pt completed the following BLE exs and stretching in supine to increase strength and assist with pain mgmt:  -2x15 hip abduction with red theraband   -2x15 knee flexion   - passive hip flexion   -AA hip flexion with gait belt as leg lifter  -passive hip IR/ER Pt reporting pain in low back decreased and motivated to attempt to stand and sit in chair. Supine>EOB supervision, pt seated EOB with BUE support to reduce pain in low back , displaying pain behaviors profusely sweating and wincing, continues to attempt to stand to relive pain. STS completed with RW CGA. Pt attempts L side stepping, d/t pain task discontinued. Pt seated EOB, EOB>Supine Min A to lift LLE. At bed level pt, endorsing frustrations with CLOF and dx, initiating conversation on sexual health and difficulty sustaining erection. OT provided education on sex as an ADL, alternative positioning, and open communication with wife to increase sexual  health and reduce anxiety with CLOF. HEP will be provided with wife present. Pt verbalized understanding and will discuss with wife. Pt requesting to reposition in bed to reduce low back pain, supine>side-lying with grab bars with supervision. OT provided ice pack to reduce pain. Pt side-lying in bed with all needs in reach.   Session 2: Skilled OT session completed to address BUE strengthening and functional mobility. Pt received supine in bed, agreeable to participate in therapy. Pt reports 5/10 pain in low back, OT provided pain intervention by repositioning.   OT provided high back reclining WC to attempt to transport to dayroom, OT demo'd features vs TIS WC, pt requesting to trial high back reclining WC for transport. TIS WC to remain in room. Supine>EOB supervision with HOB elevated. Pt seated EOB with increased pain, endorsing 8/10 low back pain, task discontinued. Pt verbalizing frustration with pain mgmt and schedule, LPN and MD alerted, LPN provided pain medication during session and to f/u with medication schedule to assist with PRN med mgmt. OT provided education on need to request PRN medication vs scheduled meds, pt verbalized understanding; however, still requesting schedule to assist with mgmt. EOB>Supine CGA, pt able to lift LLE by hooking with RLE. Supine in bed pt completed the following BUE exs with 8lb dumbbell to increase strength and independence with ADLs.    -3x30 chest press -2x15 shoulder flexion -3x30 bicep curls  -2x10 seated row with HOB elevated -2x15 overhead raise  Pt required VC for positioning and technique throughout. 8lb weight left in room to allow for increase activity tolerance during day. Pt requested  to void at bed level, set-up A provided for hygiene. Pt supine in bed with all needs in reach.   Therapy Documentation Precautions:  Precautions Precautions: Fall, Back Recall of Precautions/Restrictions: Intact Precaution/Restrictions Comments: Fall,  spinal Restrictions Weight Bearing Restrictions Per Provider Order: No  Therapy/Group: Individual Therapy Mirian Casco Woods-Chance, MS, OTR/L 10/06/2024, 8:02 AM

## 2024-10-06 NOTE — Progress Notes (Signed)
 "                                                        PROGRESS NOTE   Subjective/Complaints:  Nilsa was told pt went 8 hours without voiding- and scanned for 600cc- d/w pt this Am- he noted kept trying to go, but people in his room so much, was unable to , but no issues voiding - of note,  had PVR 190cc after voided.   Last dialudid 1:20 am until after 7:15 am this morning- was hurting pretty bad, but not in constant spasms.  Sa tin w/c longer yesterday- slept much better than the prior night- not bothered by severe spasms last night- doesn't feel like he's made any improvements- wife and I both attested he's made major improvements.     ROS: per HPI   Pt denies SOB, abd pain, CP, N/V/C/D, and vision changes    (+) Back pain and spasms somewhat better but still bothersome Objective:   No results found.  No results for input(s): WBC, HGB, HCT, PLT in the last 72 hours.  Recent Labs    10/06/24 0514  CREATININE 0.75     Intake/Output Summary (Last 24 hours) at 10/06/2024 0853 Last data filed at 10/06/2024 0727 Gross per 24 hour  Intake 1066 ml  Output 1775 ml  Net -709 ml        Physical Exam: Vital Signs Blood pressure (!) 143/82, pulse 93, temperature 98.4 F (36.9 C), temperature source Oral, resp. rate 16, height 6' 3 (1.905 m), weight (!) 140 kg, SpO2 100%.        General: awake, alert, appropriate, supine in bed; put on his shirt, did lotioning, deodorant, etc with no spasms; wife at bedside;  NAD HENT: conjugate gaze; oropharynx moist CV: regular rate and rhythm- rate in 90's; no JVD Pulmonary: CTA B/L; no W/R/R- good air movement GI: soft, NT, ND, (+)BS- normoactive Psychiatric: appropriate- less anxious this Am,  but down on himself about not making gains  Neurological: Ox3  RLE- HF 3+/5; KE/KF/DF and PF 5-/5 LLE- HF 2_/5 at best; KE/KF 4-/5 and DF/PF 5-/5 Numbness/decreased to light touch L1 downwards B/L  Neuro: Alert and  oriented x3. 5/5 strength in UE. 4/5 strength in LE. Decreased sensation in bilateral lower legs, stable 2/1   Assessment/Plan: 1. Functional deficits which require 3+ hours per day of interdisciplinary therapy in a comprehensive inpatient rehab setting. Physiatrist is providing close team supervision and 24 hour management of active medical problems listed below. Physiatrist and rehab team continue to assess barriers to discharge/monitor patient progress toward functional and medical goals  Care Tool:  Bathing    Body parts bathed by patient: Right arm, Left arm, Chest, Abdomen, Front perineal area, Buttocks, Right upper leg, Face, Left upper leg   Body parts bathed by helper: Left lower leg, Right lower leg     Bathing assist Assist Level: Minimal Assistance - Patient > 75%     Upper Body Dressing/Undressing Upper body dressing   What is the patient wearing?: Pull over shirt    Upper body assist Assist Level: Set up assist    Lower Body Dressing/Undressing Lower body dressing      What is the patient wearing?: Underwear/pull up, Pants     Lower body assist Assist for  lower body dressing: Minimal Assistance - Patient > 75%     Toileting Toileting    Toileting assist Assist for toileting: Minimal Assistance - Patient > 75%     Transfers Chair/bed transfer  Transfers assist     Chair/bed transfer assist level: Minimal Assistance - Patient > 75%     Locomotion Ambulation   Ambulation assist   Ambulation activity did not occur: Safety/medical concerns          Walk 10 feet activity   Assist  Walk 10 feet activity did not occur: Safety/medical concerns        Walk 50 feet activity   Assist Walk 50 feet with 2 turns activity did not occur: Safety/medical concerns         Walk 150 feet activity   Assist Walk 150 feet activity did not occur: Safety/medical concerns         Walk 10 feet on uneven surface  activity   Assist Walk 10  feet on uneven surfaces activity did not occur: Safety/medical concerns         Wheelchair     Assist Is the patient using a wheelchair?: Yes Type of Wheelchair: Manual    Wheelchair assist level: Dependent - Patient 0%      Wheelchair 50 feet with 2 turns activity    Assist        Assist Level: Dependent - Patient 0%   Wheelchair 150 feet activity     Assist      Assist Level: Dependent - Patient 0%   Blood pressure (!) 143/82, pulse 93, temperature 98.4 F (36.9 C), temperature source Oral, resp. rate 16, height 6' 3 (1.905 m), weight (!) 140 kg, SpO2 100%.    Medical Problem List and Plan: 1. 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 Alm Molt..NO BRACE NEEDED             -patient may shower with back incision covered             -ELOS/Goals: 7-10 days, mod I to supervision goals with PT,OT  D/c 4 weeks likely  We had a long discussion about his cauda equina dx- base don clinical weakness and Numbness exam as well as erectile dysfunction- we discussed there are ways around the erectile dysfunction issues and will see how gets better in the next weeks.   Con't CIR PT and OT- assuaged pt's concerns that isn't making gains- his pain is doing MUCH better- and doing more in therapy now 2.  Antithrombotics: -DVT/anticoagulation:  Pharmaceutical: Lovenox  initiated 09/28/2024.  Check vascular study 2/2- Dopplers (-) 2/5- will need for at least 2 months total if not 3 depending on how far walking             -antiplatelet therapy: N/A 3. Pain Management: Currently: Celebrex  200 mg every 12 hours, OxyContin  sustained-release 15 mg every 12 hours, change oxycodone  to dilaudid  1mg  q4H for moderate  pain, Robaxin  750 mg 4 times daily             -increase oxycontin  CR to 20mg  q12             -continue IV dilaudid  for severe pain- changed to 1mg  q4H prn             -begin tizanidine  2mg  q8  hours for back spasms             -change robaxin  to 750mg  q6 prn breakthrough spasms             -consider trial of lyrica for radicular pain. ( He didn't tolerate gabapentin  however)  2/2- changed PO Dilaudid  to 4 mg q4 hours prn- he looked MUCH better when back to see him 1 hour later- pain initially was 10+/10- but down to ~7-8/10 at rest sitting up at 45 degrees  2/3- looks much better today- not crying and Dilaudid  4 mg is working VERIZON better- con't regimen  2/4- ended up adding Valium  5 mg q6 hours prn and d/c of Robaxin  and Baclofen - d/w PA- can give him IV Valium  if needed- but doesn't have order- increased Oxycontin  to 30 mg BID yesterday. Will hold off making pain med changes today.   2/5- will increase Valium  to 7.5 mg q6 hours prn- no other med changes  2/6- doing much better this AM, at least before therapy 4. Mood/Behavior/Sleep/adjustment disorder: Cymbalta  30 mg daily titrated to 60 mg daily per psychiatry services, trazodone  100 mg nightly. Discussed with his wife that he is receiving Cymbalta  for his anxiety and this can also help with pain  2/3- less anxiety seen today since pain under better control  2/5- more anxiety today with cauda equina dx that we discussed as well as erectile issues that were finally determined  2/6- less anxiety today             -antipsychotic agents: N/A 5. Neuropsych/cognition: This patient is capable of making decisions on his own behalf. 6. Skin/Wound Care: Routine skin checks 7. Fluids/Electrolytes/Nutrition: Routine ins and outs with follow-up chemistries 8.  Streptococcus intermedius bacteremia.  Continue IV ceftriaxone  per infectious disease.  TTE unremarkable.  Repeat blood cultures drawn 1/27 NGTD.  Blood cultures need to be negative for 72 hours prior  to PICC line placement.  2/2- has PICC-  9.  Class III obesity.  BMI 40.64.  Lifestyle changes with dietary follow-up  2/3- Weighed again- weight 140 kg/BMI 38.58 10.  Tobacco use: continue NicoDerm patch.  Provide counseling  2/3- pt advised smoking can increase risk of return of infection  2/5- pt really wants to stop since he's heard could affect his ability to have erections 11.  Mild neurogenic bowel and bladder.  Emptying well. Check PVRs. -moved his bowels 3 days ago -scheduled miralax  qam along with senna-s 2 tabs at night -encourage appropriate PO intake  -increase magnesium  to 400mg  HS, LBM 1/31  2/3- LBM 1/31- if no BM today, will give Sorbitol  tomorrow- made sure pt took his miralax  today  2/4- Will order sorbitol  30cc if no BM by 2pm and Soap suds enema if no results by 6pm.  Don't want pt pushing a lot to have BM- has been at least 4 days.   2/5- LBM yesterday- will con't regimen for now 12. Erectile dysfunction  2/5- we discussed no Am erections and fewer psychogenic erections- might need Rx for Viagra at d/c,  but will monitor    I spent a total of 45   minutes on total care today- >50% coordination of care- due to  D/w pt and wife about gains he's making daily and attempted to assuage his concerns about his improvement he's obviously making.   LOS: 7 days A FACE TO FACE EVALUATION WAS PERFORMED  Macaulay Reicher 10/06/2024, 8:53 AM     "

## 2024-10-27 ENCOUNTER — Inpatient Hospital Stay: Payer: Self-pay | Admitting: Infectious Diseases
